# Patient Record
Sex: Male | Born: 1950 | Race: White | Hispanic: No | Smoking: Former smoker
Health system: Southern US, Community
[De-identification: ages and names within clinical notes are randomized; demographics above are authoritative.]

## PROBLEM LIST (undated history)

## (undated) DIAGNOSIS — G709 Myoneural disorder, unspecified: Secondary | ICD-10-CM

## (undated) DIAGNOSIS — I4891 Unspecified atrial fibrillation: Secondary | ICD-10-CM

## (undated) DIAGNOSIS — Z9889 Other specified postprocedural states: Secondary | ICD-10-CM

## (undated) DIAGNOSIS — I10 Essential (primary) hypertension: Secondary | ICD-10-CM

## (undated) DIAGNOSIS — E785 Hyperlipidemia, unspecified: Secondary | ICD-10-CM

## (undated) DIAGNOSIS — Z8679 Personal history of other diseases of the circulatory system: Secondary | ICD-10-CM

## (undated) DIAGNOSIS — R011 Cardiac murmur, unspecified: Secondary | ICD-10-CM

## (undated) DIAGNOSIS — C801 Malignant (primary) neoplasm, unspecified: Secondary | ICD-10-CM

## (undated) DIAGNOSIS — K219 Gastro-esophageal reflux disease without esophagitis: Secondary | ICD-10-CM

## (undated) DIAGNOSIS — F32A Depression, unspecified: Secondary | ICD-10-CM

## (undated) DIAGNOSIS — I499 Cardiac arrhythmia, unspecified: Secondary | ICD-10-CM

## (undated) DIAGNOSIS — F329 Major depressive disorder, single episode, unspecified: Secondary | ICD-10-CM

## (undated) HISTORY — DX: Essential (primary) hypertension: I10

## (undated) HISTORY — DX: Hyperlipidemia, unspecified: E78.5

## (undated) HISTORY — DX: Malignant (primary) neoplasm, unspecified: C80.1

## (undated) HISTORY — PX: CARDIAC CATHETERIZATION: SHX172

## (undated) HISTORY — PX: TRANSTHORACIC ECHOCARDIOGRAM: SHX275

## (undated) HISTORY — PX: TONSILLECTOMY: SUR1361

## (undated) HISTORY — PX: COLONOSCOPY: SHX174

## (undated) HISTORY — PX: MITRAL VALVE REPLACEMENT: SHX147

## (undated) HISTORY — PX: OTHER SURGICAL HISTORY: SHX169

---

## 2002-11-09 ENCOUNTER — Encounter: Payer: Self-pay | Admitting: Internal Medicine

## 2004-06-08 ENCOUNTER — Ambulatory Visit: Payer: Self-pay | Admitting: Internal Medicine

## 2004-06-20 ENCOUNTER — Ambulatory Visit: Payer: Self-pay | Admitting: Internal Medicine

## 2004-07-16 ENCOUNTER — Ambulatory Visit: Payer: Self-pay | Admitting: Internal Medicine

## 2004-10-01 ENCOUNTER — Ambulatory Visit: Payer: Self-pay | Admitting: Internal Medicine

## 2004-10-05 ENCOUNTER — Ambulatory Visit: Payer: Self-pay | Admitting: Internal Medicine

## 2005-03-15 ENCOUNTER — Ambulatory Visit: Payer: Self-pay | Admitting: Internal Medicine

## 2005-05-27 ENCOUNTER — Ambulatory Visit: Payer: Self-pay | Admitting: Internal Medicine

## 2005-06-03 ENCOUNTER — Ambulatory Visit: Payer: Self-pay | Admitting: Internal Medicine

## 2005-11-18 ENCOUNTER — Ambulatory Visit: Payer: Self-pay | Admitting: General Surgery

## 2005-11-18 ENCOUNTER — Encounter: Payer: Self-pay | Admitting: Internal Medicine

## 2005-11-20 ENCOUNTER — Ambulatory Visit: Payer: Self-pay | Admitting: Internal Medicine

## 2006-03-11 ENCOUNTER — Ambulatory Visit: Payer: Self-pay | Admitting: Internal Medicine

## 2006-04-18 ENCOUNTER — Ambulatory Visit: Payer: Self-pay | Admitting: Internal Medicine

## 2006-06-06 ENCOUNTER — Ambulatory Visit: Payer: Self-pay | Admitting: Internal Medicine

## 2006-06-06 LAB — CONVERTED CEMR LAB
ALT: 34 units/L (ref 0–40)
AST: 26 units/L (ref 0–37)
Albumin: 4.2 g/dL (ref 3.5–5.2)
Alkaline Phosphatase: 58 units/L (ref 39–117)
BUN: 14 mg/dL (ref 6–23)
Basophils Absolute: 0 10*3/uL (ref 0.0–0.1)
Basophils Relative: 0.2 % (ref 0.0–1.0)
Bilirubin, Direct: 0.1 mg/dL (ref 0.0–0.3)
CO2: 32 meq/L (ref 19–32)
Calcium: 9.5 mg/dL (ref 8.4–10.5)
Chloride: 103 meq/L (ref 96–112)
Cholesterol: 218 mg/dL (ref 0–200)
Creatinine, Ser: 0.9 mg/dL (ref 0.4–1.5)
Direct LDL: 166 mg/dL
Eosinophils Absolute: 0.3 10*3/uL (ref 0.0–0.6)
Eosinophils Relative: 3.9 % (ref 0.0–5.0)
GFR calc Af Amer: 112 mL/min
GFR calc non Af Amer: 93 mL/min
Glucose, Bld: 83 mg/dL (ref 70–99)
HCT: 48.5 % (ref 39.0–52.0)
HDL: 47.1 mg/dL (ref >39.0)
Hemoglobin: 16.5 g/dL (ref 13.0–17.0)
Lymphocytes Relative: 33.7 % (ref 12.0–46.0)
MCHC: 34.1 g/dL (ref 30.0–36.0)
MCV: 89.8 fL (ref 78.0–100.0)
Monocytes Absolute: 0.6 10*3/uL (ref 0.2–0.7)
Monocytes Relative: 9.7 % (ref 3.0–11.0)
Neutro Abs: 3.4 10*3/uL (ref 1.4–7.7)
Neutrophils Relative %: 52.5 % (ref 43.0–77.0)
PSA: 1.74 ng/mL (ref 0.10–4.00)
Platelets: 223 10*3/uL (ref 150–400)
Potassium: 4.1 meq/L (ref 3.5–5.1)
RBC: 5.4 M/uL (ref 4.22–5.81)
RDW: 12 % (ref 11.5–14.6)
Sodium: 143 meq/L (ref 135–145)
TSH: 1.48 microintl units/mL (ref 0.35–5.50)
Total Bilirubin: 0.9 mg/dL (ref 0.3–1.2)
Total CHOL/HDL Ratio: 4.6
Total Protein: 6.8 g/dL (ref 6.0–8.3)
Triglycerides: 162 mg/dL — ABNORMAL HIGH (ref 0–149)
VLDL: 32 mg/dL (ref 0–40)
WBC: 6.5 10*3/uL (ref 4.5–10.5)

## 2006-06-11 ENCOUNTER — Ambulatory Visit: Payer: Self-pay | Admitting: Internal Medicine

## 2006-09-29 ENCOUNTER — Ambulatory Visit: Payer: Self-pay | Admitting: Internal Medicine

## 2006-09-29 LAB — CONVERTED CEMR LAB
Cholesterol: 183 mg/dL (ref 0–200)
HDL: 34.8 mg/dL — ABNORMAL LOW (ref >39.0)
LDL Cholesterol: 116 mg/dL — ABNORMAL HIGH (ref 0–99)
Total CHOL/HDL Ratio: 5.3
Triglycerides: 160 mg/dL — ABNORMAL HIGH (ref 0–149)
VLDL: 32 mg/dL (ref 0–40)

## 2006-10-17 ENCOUNTER — Ambulatory Visit: Payer: Self-pay | Admitting: Internal Medicine

## 2007-06-08 ENCOUNTER — Encounter: Payer: Self-pay | Admitting: Internal Medicine

## 2007-06-12 ENCOUNTER — Ambulatory Visit: Payer: Self-pay | Admitting: Internal Medicine

## 2007-06-12 LAB — CONVERTED CEMR LAB
ALT: 27 units/L (ref 0–53)
AST: 27 units/L (ref 0–37)
Albumin: 3.8 g/dL (ref 3.5–5.2)
Alkaline Phosphatase: 62 units/L (ref 39–117)
BUN: 14 mg/dL (ref 6–23)
Basophils Absolute: 0 10*3/uL (ref 0.0–0.1)
Basophils Relative: 0.5 % (ref 0.0–1.0)
Bilirubin Urine: NEGATIVE
Bilirubin, Direct: 0.2 mg/dL (ref 0.0–0.3)
Blood in Urine, dipstick: NEGATIVE
CO2: 33 meq/L — ABNORMAL HIGH (ref 19–32)
Calcium: 9.3 mg/dL (ref 8.4–10.5)
Chloride: 107 meq/L (ref 96–112)
Cholesterol: 204 mg/dL (ref 0–200)
Creatinine, Ser: 0.9 mg/dL (ref 0.4–1.5)
Direct LDL: 149 mg/dL
Eosinophils Absolute: 0.3 10*3/uL (ref 0.0–0.6)
Eosinophils Relative: 4.3 % (ref 0.0–5.0)
GFR calc Af Amer: 112 mL/min
GFR calc non Af Amer: 92 mL/min
Glucose, Bld: 82 mg/dL (ref 70–99)
Glucose, Urine, Semiquant: NEGATIVE
HCT: 47.4 % (ref 39.0–52.0)
HDL: 39.5 mg/dL (ref >39.0)
Hemoglobin: 15.6 g/dL (ref 13.0–17.0)
Ketones, urine, test strip: NEGATIVE
Lymphocytes Relative: 34.2 % (ref 12.0–46.0)
MCHC: 33 g/dL (ref 30.0–36.0)
MCV: 91.4 fL (ref 78.0–100.0)
Monocytes Absolute: 0.7 10*3/uL (ref 0.2–0.7)
Monocytes Relative: 10.6 % (ref 3.0–11.0)
Neutro Abs: 3.1 10*3/uL (ref 1.4–7.7)
Neutrophils Relative %: 50.4 % (ref 43.0–77.0)
Nitrite: NEGATIVE
PSA: 1.81 ng/mL (ref 0.10–4.00)
Platelets: 190 10*3/uL (ref 150–400)
Potassium: 4.5 meq/L (ref 3.5–5.1)
Protein, U semiquant: NEGATIVE
RBC: 5.19 M/uL (ref 4.22–5.81)
RDW: 12.1 % (ref 11.5–14.6)
Sodium: 145 meq/L (ref 135–145)
Specific Gravity, Urine: 1.025
TSH: 0.96 microintl units/mL (ref 0.35–5.50)
Total Bilirubin: 0.8 mg/dL (ref 0.3–1.2)
Total CHOL/HDL Ratio: 5.2
Total Protein: 6 g/dL (ref 6.0–8.3)
Triglycerides: 93 mg/dL (ref 0–149)
Urobilinogen, UA: 0.2
VLDL: 19 mg/dL (ref 0–40)
WBC Urine, dipstick: NEGATIVE
WBC: 6.3 10*3/uL (ref 4.5–10.5)
pH: 6

## 2007-06-18 ENCOUNTER — Ambulatory Visit: Payer: Self-pay | Admitting: Internal Medicine

## 2007-06-18 DIAGNOSIS — I10 Essential (primary) hypertension: Secondary | ICD-10-CM | POA: Insufficient documentation

## 2007-06-18 DIAGNOSIS — E785 Hyperlipidemia, unspecified: Secondary | ICD-10-CM | POA: Insufficient documentation

## 2007-06-18 DIAGNOSIS — Z9889 Other specified postprocedural states: Secondary | ICD-10-CM | POA: Insufficient documentation

## 2007-06-18 DIAGNOSIS — I059 Rheumatic mitral valve disease, unspecified: Secondary | ICD-10-CM | POA: Insufficient documentation

## 2007-06-18 DIAGNOSIS — I38 Endocarditis, valve unspecified: Secondary | ICD-10-CM | POA: Insufficient documentation

## 2007-06-18 LAB — CONVERTED CEMR LAB
Cholesterol, target level: 200 mg/dL
HDL goal, serum: 40 mg/dL
LDL Goal: 130 mg/dL

## 2007-08-05 ENCOUNTER — Encounter: Payer: Self-pay | Admitting: Internal Medicine

## 2007-10-09 ENCOUNTER — Ambulatory Visit: Payer: Self-pay | Admitting: Internal Medicine

## 2007-10-09 DIAGNOSIS — T887XXA Unspecified adverse effect of drug or medicament, initial encounter: Secondary | ICD-10-CM | POA: Insufficient documentation

## 2007-10-09 LAB — CONVERTED CEMR LAB
ALT: 24 units/L (ref 0–53)
AST: 24 units/L (ref 0–37)
Albumin: 3.8 g/dL (ref 3.5–5.2)
Alkaline Phosphatase: 58 units/L (ref 39–117)
Bilirubin, Direct: 0.1 mg/dL (ref 0.0–0.3)
Cholesterol: 175 mg/dL (ref 0–200)
HDL: 31.5 mg/dL — ABNORMAL LOW (ref >39.0)
LDL Cholesterol: 124 mg/dL — ABNORMAL HIGH (ref 0–99)
Total Bilirubin: 0.6 mg/dL (ref 0.3–1.2)
Total CHOL/HDL Ratio: 5.6
Total Protein: 6 g/dL (ref 6.0–8.3)
Triglycerides: 96 mg/dL (ref 0–149)
VLDL: 19 mg/dL (ref 0–40)

## 2007-10-21 ENCOUNTER — Ambulatory Visit: Payer: Self-pay | Admitting: Internal Medicine

## 2008-01-22 ENCOUNTER — Telehealth: Payer: Self-pay | Admitting: Internal Medicine

## 2008-06-17 ENCOUNTER — Encounter: Payer: Self-pay | Admitting: Internal Medicine

## 2008-07-06 ENCOUNTER — Telehealth: Payer: Self-pay | Admitting: *Deleted

## 2008-07-08 ENCOUNTER — Ambulatory Visit: Payer: Self-pay | Admitting: Internal Medicine

## 2008-07-08 LAB — CONVERTED CEMR LAB
ALT: 27 units/L (ref 0–53)
AST: 22 units/L (ref 0–37)
Albumin: 4 g/dL (ref 3.5–5.2)
Alkaline Phosphatase: 59 units/L (ref 39–117)
BUN: 13 mg/dL (ref 6–23)
Basophils Absolute: 0 10*3/uL (ref 0.0–0.1)
Basophils Relative: 0.8 % (ref 0.0–3.0)
Bilirubin Urine: NEGATIVE
Bilirubin, Direct: 0 mg/dL (ref 0.0–0.3)
CO2: 26 meq/L (ref 19–32)
Calcium: 8.8 mg/dL (ref 8.4–10.5)
Chloride: 108 meq/L (ref 96–112)
Cholesterol: 186 mg/dL (ref 0–200)
Creatinine, Ser: 0.8 mg/dL (ref 0.4–1.5)
Eosinophils Absolute: 0.3 10*3/uL (ref 0.0–0.7)
Eosinophils Relative: 5.4 % — ABNORMAL HIGH (ref 0.0–5.0)
GFR calc non Af Amer: 105.49 mL/min (ref >60)
Glucose, Bld: 95 mg/dL (ref 70–99)
HCT: 43.5 % (ref 39.0–52.0)
HDL: 38.5 mg/dL — ABNORMAL LOW (ref >39.00)
Hemoglobin, Urine: NEGATIVE
Hemoglobin: 15.2 g/dL (ref 13.0–17.0)
Ketones, ur: NEGATIVE mg/dL
LDL Cholesterol: 127 mg/dL — ABNORMAL HIGH (ref 0–99)
Leukocytes, UA: NEGATIVE
Lymphocytes Relative: 35.2 % (ref 12.0–46.0)
Lymphs Abs: 2.1 10*3/uL (ref 0.7–4.0)
MCHC: 34.9 g/dL (ref 30.0–36.0)
MCV: 88.7 fL (ref 78.0–100.0)
Monocytes Absolute: 0.6 10*3/uL (ref 0.1–1.0)
Monocytes Relative: 9.2 % (ref 3.0–12.0)
Neutro Abs: 3.1 10*3/uL (ref 1.4–7.7)
Neutrophils Relative %: 49.4 % (ref 43.0–77.0)
Nitrite: NEGATIVE
PSA: 1.35 ng/mL (ref 0.10–4.00)
Platelets: 190 10*3/uL (ref 150.0–400.0)
Potassium: 4 meq/L (ref 3.5–5.1)
RBC: 4.9 M/uL (ref 4.22–5.81)
RDW: 12 % (ref 11.5–14.6)
Sodium: 141 meq/L (ref 135–145)
Specific Gravity, Urine: 1.02 (ref 1.000–1.030)
TSH: 1.67 microintl units/mL (ref 0.35–5.50)
Total Bilirubin: 0.6 mg/dL (ref 0.3–1.2)
Total CHOL/HDL Ratio: 5
Total Protein, Urine: NEGATIVE mg/dL
Total Protein: 6.5 g/dL (ref 6.0–8.3)
Triglycerides: 104 mg/dL (ref 0.0–149.0)
Urine Glucose: NEGATIVE mg/dL
Urobilinogen, UA: 0.2 (ref 0.0–1.0)
VLDL: 20.8 mg/dL (ref 0.0–40.0)
WBC: 6.1 10*3/uL (ref 4.5–10.5)
pH: 6 (ref 5.0–8.0)

## 2008-07-15 ENCOUNTER — Ambulatory Visit: Payer: Self-pay | Admitting: Internal Medicine

## 2009-04-22 HISTORY — PX: CHOLECYSTECTOMY: SHX55

## 2009-04-22 HISTORY — PX: HERNIA REPAIR: SHX51

## 2009-05-03 ENCOUNTER — Encounter: Payer: Self-pay | Admitting: Internal Medicine

## 2009-10-09 ENCOUNTER — Ambulatory Visit: Payer: Self-pay | Admitting: Internal Medicine

## 2009-10-09 LAB — CONVERTED CEMR LAB
ALT: 24 units/L (ref 0–53)
AST: 24 units/L (ref 0–37)
Albumin: 4.1 g/dL (ref 3.5–5.2)
Alkaline Phosphatase: 58 units/L (ref 39–117)
BUN: 16 mg/dL (ref 6–23)
Basophils Absolute: 0.1 10*3/uL (ref 0.0–0.1)
Basophils Relative: 0.9 % (ref 0.0–3.0)
Bilirubin Urine: NEGATIVE
Bilirubin, Direct: 0.1 mg/dL (ref 0.0–0.3)
Blood in Urine, dipstick: NEGATIVE
CO2: 30 meq/L (ref 19–32)
Calcium: 9 mg/dL (ref 8.4–10.5)
Chloride: 106 meq/L (ref 96–112)
Cholesterol: 218 mg/dL — ABNORMAL HIGH (ref 0–200)
Creatinine, Ser: 0.9 mg/dL (ref 0.4–1.5)
Direct LDL: 159.5 mg/dL
Eosinophils Absolute: 0.3 10*3/uL (ref 0.0–0.7)
Eosinophils Relative: 4.8 % (ref 0.0–5.0)
GFR calc non Af Amer: 89.39 mL/min (ref >60)
Glucose, Bld: 86 mg/dL (ref 70–99)
Glucose, Urine, Semiquant: NEGATIVE
HCT: 45.2 % (ref 39.0–52.0)
HDL: 47.8 mg/dL (ref >39.00)
Hemoglobin: 15.5 g/dL (ref 13.0–17.0)
Ketones, urine, test strip: NEGATIVE
Lymphocytes Relative: 30.5 % (ref 12.0–46.0)
Lymphs Abs: 2.1 10*3/uL (ref 0.7–4.0)
MCHC: 34.2 g/dL (ref 30.0–36.0)
MCV: 91.4 fL (ref 78.0–100.0)
Monocytes Absolute: 0.7 10*3/uL (ref 0.1–1.0)
Monocytes Relative: 9.4 % (ref 3.0–12.0)
Neutro Abs: 3.8 10*3/uL (ref 1.4–7.7)
Neutrophils Relative %: 54.4 % (ref 43.0–77.0)
Nitrite: NEGATIVE
PSA: 1.8 ng/mL (ref 0.10–4.00)
Platelets: 172 10*3/uL (ref 150.0–400.0)
Potassium: 4 meq/L (ref 3.5–5.1)
Protein, U semiquant: NEGATIVE
RBC: 4.94 M/uL (ref 4.22–5.81)
RDW: 13.9 % (ref 11.5–14.6)
Sodium: 143 meq/L (ref 135–145)
Specific Gravity, Urine: 1.01
TSH: 2.07 microintl units/mL (ref 0.35–5.50)
Total Bilirubin: 0.5 mg/dL (ref 0.3–1.2)
Total CHOL/HDL Ratio: 5
Total Protein: 6.6 g/dL (ref 6.0–8.3)
Triglycerides: 169 mg/dL — ABNORMAL HIGH (ref 0.0–149.0)
Urobilinogen, UA: 0.2
VLDL: 33.8 mg/dL (ref 0.0–40.0)
WBC Urine, dipstick: NEGATIVE
WBC: 7 10*3/uL (ref 4.5–10.5)
pH: 5.5

## 2009-10-17 ENCOUNTER — Ambulatory Visit: Payer: Self-pay | Admitting: Internal Medicine

## 2009-11-06 ENCOUNTER — Ambulatory Visit: Payer: Self-pay | Admitting: General Surgery

## 2009-11-16 ENCOUNTER — Ambulatory Visit: Payer: Self-pay | Admitting: General Surgery

## 2009-11-20 ENCOUNTER — Ambulatory Visit: Payer: Self-pay | Admitting: General Surgery

## 2009-11-21 LAB — PATHOLOGY REPORT

## 2010-01-16 ENCOUNTER — Ambulatory Visit: Payer: Self-pay | Admitting: Internal Medicine

## 2010-01-16 LAB — CONVERTED CEMR LAB
ALT: 29 units/L (ref 0–53)
AST: 23 units/L (ref 0–37)
Albumin: 4.4 g/dL (ref 3.5–5.2)
Alkaline Phosphatase: 65 units/L (ref 39–117)
Bilirubin, Direct: 0.2 mg/dL (ref 0.0–0.3)
Cholesterol: 204 mg/dL — ABNORMAL HIGH (ref 0–200)
Direct LDL: 135.5 mg/dL
HDL: 38.4 mg/dL — ABNORMAL LOW (ref >39.00)
Total Bilirubin: 0.6 mg/dL (ref 0.3–1.2)
Total CHOL/HDL Ratio: 5
Total Protein: 7.1 g/dL (ref 6.0–8.3)
Triglycerides: 143 mg/dL (ref 0.0–149.0)
VLDL: 28.6 mg/dL (ref 0.0–40.0)

## 2010-02-05 ENCOUNTER — Ambulatory Visit: Payer: Self-pay | Admitting: Internal Medicine

## 2010-02-05 DIAGNOSIS — Z8719 Personal history of other diseases of the digestive system: Secondary | ICD-10-CM | POA: Insufficient documentation

## 2010-05-23 NOTE — Procedures (Signed)
Summary: colonoscopy  colonoscopy   Imported By: Kassie Mends 07/06/2007 09:59:55  _____________________________________________________________________  External Attachment:    Type:   Image     Comment:   colonoscopy

## 2010-05-23 NOTE — Assessment & Plan Note (Signed)
Summary: 4 month rov/njr   Vital Signs:  Patient Profile:   60 Years Old Male Height:     66 inches Weight:      154 pounds BMI:     24.95 Temp:     98.2 degrees F oral Pulse rate:   76 / minute Resp:     14 per minute BP sitting:   130 / 78  (left arm)  Vitals Entered By: Willy Eddy, LPN (October 20, 1608 11:18 AM)                 Chief Complaint:  roa labs.  History of Present Illness:  Hyperlipidemia Follow-Up      This is a 60 year old man who presents for Hyperlipidemia follow-up.  The patient denies muscle aches, GI upset, abdominal pain, flushing, itching, constipation, diarrhea, and fatigue.  The patient denies the following symptoms: chest pain/pressure, exercise intolerance, dypsnea, palpitations, syncope, and pedal edema.  Compliance with medications (by patient report) has been near 100%.  Dietary compliance has been excellent.  The patient reports exercising daily.  Adjunctive measures currently used by the patient include fiber and weight reduction.      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       Chorda Rupture       Valvuloplasty with Placement of Cosgrove ring  Past Surgical History:    Reviewed history from 06/18/2007 and no changes required:       valvuloplasty with replacement of cosgrove ring , l congenital rubture of cordai       Appendectomy   Family History:    Reviewed history from 06/18/2007 and no changes required:       Family History of Colon CA 1st degree relative <60       Family History Other cancer,  father ssca of mouth  Social History:    Reviewed history from 06/18/2007 and no changes required:       Married       Never Smoked       Alcohol use-yes       Drug use-no       Regular exercise-yes    Review of Systems  The patient denies anorexia, fever, weight loss, weight  gain, vision loss, decreased hearing, hoarseness, chest pain, syncope, dyspnea on exertion, peripheral edema, prolonged cough, headaches, hemoptysis, abdominal pain, melena, hematochezia, severe indigestion/heartburn, hematuria, incontinence, genital sores, muscle weakness, suspicious skin lesions, transient blindness, difficulty walking, depression, unusual weight change, abnormal bleeding, enlarged lymph nodes, angioedema, and breast masses.     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   Eyes:     pupils equal and pupils round.   Ears:     R ear normal and L ear normal.   Nose:     no nasal discharge.   Mouth:     pharynx pink and moist.   Neck:     No deformities, masses, or tenderness noted. Lungs:     Normal respiratory effort, chest expands symmetrically. Lungs are clear to auscultation, no crackles or wheezes. Heart:     Normal rate and regular rhythm. S1 and S2 normal without gallop, murmur,  click, rub or other extra sounds. Abdomen:     Bowel sounds positive,abdomen soft and non-tender without masses, organomegaly or hernias noted.    Impression & Recommendations:  Problem # 1:  HYPERTENSION (ICD-401.9)  His updated medication list for this problem includes:    Coreg 6.25 Mg Tabs (Carvedilol) .Marland Kitchen..Marland Kitchen Two times a day  BP today: 130/78 Prior BP: 124/76 (06/18/2007)  Prior 10 Yr Risk Heart Disease: Not enough information (06/18/2007)  Labs Reviewed: Creat: 0.9 (06/12/2007) Chol: 175 (10/09/2007)   HDL: 31.5 (10/09/2007)   LDL: 124 (10/09/2007)   TG: 96 (10/09/2007)   Problem # 2:  HYPERLIPIDEMIA (ICD-272.4)  Labs Reviewed: Chol: 175 (10/09/2007)   HDL: 31.5 (10/09/2007)   LDL: 124 (10/09/2007)   TG: 96 (10/09/2007) SGOT: 24 (10/09/2007)   SGPT: 24 (10/09/2007)  Lipid Goals: Chol Goal: 200 (06/18/2007)   HDL Goal: 40  (06/18/2007)   LDL Goal: 130 (06/18/2007)   TG Goal: 150 (06/18/2007)  Prior 10 Yr Risk Heart Disease: Not enough information (06/18/2007)   Problem # 3:  MITRAL VALVE PROLAPSE (ICD-424.0)  His updated medication list for this problem includes:    Coreg 6.25 Mg Tabs (Carvedilol) .Marland Kitchen..Marland Kitchen Two times a day    Adult Aspirin Low Strength 81 Mg Tbdp (Aspirin) ..... Once daily   Complete Medication List: 1)  Proctosert Hc 30 Mg Supp (Hydrocortisone acetate) .... Use as directed 2)  Nexium 40 Mg Cpdr (Esomeprazole magnesium) .... Once daily 3)  Coreg 6.25 Mg Tabs (Carvedilol) .... Two times a day 4)  Nexium 40 Mg Cpdr (Esomeprazole magnesium) .... Once daily 5)  Remeron 15 Mg Tabs (Mirtazapine) .... Once daily 6)  Adult Aspirin Low Strength 81 Mg Tbdp (Aspirin) .... Once daily 7)  Fish Oil Concentrate 1000 Mg Caps (Omega-3 fatty acids) .... One by mouth two times a day with food 8)  Loprox 1 % Sham (Ciclopirox) .... Apply to scalp as directed 9)  Clindagel 1 % Gel (Clindamycin phosphate) .... Apply to face daily   Patient Instructions: 1)  Please schedule a follow-up appointment in 7 months.   CPX  with labs prior   ]

## 2010-05-23 NOTE — Procedures (Signed)
Summary: colonoscopy  colonoscopy   Imported By: Kassie Mends 06/19/2007 10:40:44  _____________________________________________________________________  External Attachment:    Type:   Image     Comment:   colonoscopy

## 2010-05-23 NOTE — Assessment & Plan Note (Signed)
Summary: cpx/ccm  Medications Added COREG 6.25 MG  TABS (CARVEDILOL) two times a day NEXIUM 40 MG  CPDR (ESOMEPRAZOLE MAGNESIUM) once daily REMERON 15 MG  TABS (MIRTAZAPINE) once daily ADULT ASPIRIN LOW STRENGTH 81 MG  TBDP (ASPIRIN) once daily FISH OIL CONCENTRATE 1000 MG  CAPS (OMEGA-3 FATTY ACIDS) one by mouth two times a day with food LOPROX 1 %  SHAM (CICLOPIROX) apply to scalp as directed CLINDAGEL 1 %  GEL (CLINDAMYCIN PHOSPHATE) apply to face daily        Vital Signs:  Patient Profile:   60 Years Old Male Height:     66 inches Weight:      151 pounds Temp:     98.6 degrees F oral Pulse rate:   72 / minute Resp:     12 per minute BP sitting:   124 / 76  (left arm)  Vitals Entered By: Willy Eddy, LPN (June 18, 2007 2:44 PM)                 Chief Complaint:  cpx/dt in 2003/colon in 2007/saw card last week with ekg.  Lipid Management History:      Positive NCEP/ATP III risk factors include male age 72 years old or older, HDL cholesterol less than 40, and hypertension.  Negative NCEP/ATP III risk factors include non-tobacco-user status, no ASHD (atherosclerotic heart disease), no prior stroke/TIA, no peripheral vascular disease, and no history of aortic aneurysm.       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       Chorda Rupture       Valvuloplasty with Placement of Cosgrove ring  Past Surgical History:    valvuloplasty with replacement of cosgrove ring , l congenital rubture of cordai    Appendectomy   Family History:    Reviewed history and no changes required:       Family History of Colon CA 1st degree relative <60       Family History Other cancer,  father ssca of mouth  Social History:    Reviewed history and no changes required:       Married       Never Smoked       Alcohol use-yes       Drug use-no   Regular exercise-yes   Risk Factors:  Tobacco use:  never Drug use:  no Alcohol use:  yes Exercise:  yes  Colonoscopy History:     Date of Last Colonoscopy:  11/18/2005    Results:  Results: Normal.    Colonoscopy History:     Date of Last Colonoscopy:  11/18/2005    Results:  Normal   Colonoscopy History:     Date of Last Colonoscopy:  04/22/2005    Results:  due in 5 years    Review of Systems  The patient denies anorexia, fever, weight loss, weight gain, vision loss, decreased hearing, hoarseness, chest pain, syncope, dyspnea on exhertion, peripheral edema, prolonged cough, hemoptysis, abdominal pain, melena, hematochezia, severe indigestion/heartburn, hematuria, incontinence, genital sores, muscle weakness, suspicious skin lesions, transient blindness, difficulty walking, depression, unusual weight change, abnormal bleeding, enlarged lymph nodes, angioedema, breast masses, and testicular masses.     Physical Exam  General:     Well-developed,well-nourished,in no acute distress; alert,appropriate and cooperative throughout examination Head:  focal alopecia and male-pattern balding.   Eyes:     pupils equal and pupils round.   Ears:     R ear normal and L ear normal.   Nose:     no nasal discharge.   Mouth:     pharynx pink and moist.   Neck:     No deformities, masses, or tenderness noted. Chest Wall:     No deformities, masses, tenderness or gynecomastia noted. Breasts:     No masses or gynecomastia noted Lungs:     Normal respiratory effort, chest expands symmetrically. Lungs are clear to auscultation, no crackles or wheezes. Heart:     Normal rate and regular rhythm. S1 and S2 normal without gallop, murmur, click, rub or other extra sounds.    Impression & Recommendations:  Problem # 1:  HYPERLIPIDEMIA (ICD-272.4)  Labs Reviewed: Chol: 204 (06/12/2007)   HDL: 39.5 (06/12/2007)   LDL: DEL (06/12/2007)   TG: 93 (06/12/2007) SGOT: 27 (06/12/2007)    SGPT: 27 (06/12/2007)  Lipid Goals: Chol Goal: 200 (06/18/2007)   HDL Goal: 40 (06/18/2007)   LDL Goal: 130 (06/18/2007)   TG Goal: 150 (06/18/2007)  10 Yr Risk Heart Disease: Not enough information   Problem # 2:  VALVULAR HEART DISEASE (ICD-424.90)  His updated medication list for this problem includes:    Coreg 6.25 Mg Tabs (Carvedilol) .Marland Kitchen..Marland Kitchen Two times a day    Adult Aspirin Low Strength 81 Mg Tbdp (Aspirin) ..... Once daily   Problem # 3:  HYPERTENSION (ICD-401.9)  His updated medication list for this problem includes:    Coreg 6.25 Mg Tabs (Carvedilol) .Marland Kitchen..Marland Kitchen Two times a day  BP today: 124/76  10 Yr Risk Heart Disease: Not enough information  Labs Reviewed: Creat: 0.9 (06/12/2007) Chol: 204 (06/12/2007)   HDL: 39.5 (06/12/2007)   LDL: DEL (06/12/2007)   TG: 93 (06/12/2007)   Problem # 4:  Preventive Health Care (ICD-V70.0)  Reviewed preventive care protocols, scheduled due services, and updated immunizations.   Medications Added to Medication List This Visit: 1)  Coreg 6.25 Mg Tabs (Carvedilol) .... Two times a day 2)  Nexium 40 Mg Cpdr (Esomeprazole magnesium) .... Once daily 3)  Remeron 15 Mg Tabs (Mirtazapine) .... Once daily 4)  Adult Aspirin Low Strength 81 Mg Tbdp (Aspirin) .... Once daily 5)  Fish Oil Concentrate 1000 Mg Caps (Omega-3 fatty acids) .... One by mouth two times a day with food 6)  Loprox 1 % Sham (Ciclopirox) .... Apply to scalp as directed 7)  Clindagel 1 % Gel (Clindamycin phosphate) .... Apply to face daily  Complete Medication List: 1)  Proctosert Hc 30 Mg Supp (Hydrocortisone acetate) .... Use as directed 2)  Nexium 40 Mg Cpdr (Esomeprazole magnesium) .... Once daily 3)  Coreg 6.25 Mg Tabs (Carvedilol) .... Two times a day 4)  Nexium 40 Mg Cpdr (Esomeprazole magnesium) .... Once daily 5)  Remeron 15 Mg Tabs (Mirtazapine) .... Once daily 6)  Adult Aspirin Low Strength 81 Mg Tbdp (Aspirin) .... Once daily 7)  Fish Oil Concentrate 1000 Mg  Caps (Omega-3 fatty acids) .... One by mouth two times a day with food 8)  Loprox 1 % Sham (Ciclopirox) .... Apply to scalp as directed 9)  Clindagel 1 % Gel (Clindamycin phosphate) .... Apply to face daily  Lipid Assessment/Plan:      Based on NCEP/ATP III, the patient's risk factor category is "2 or more risk factors and a calculated 10 year CAD risk of < 20%".  From this information, the patient's calculated lipid goals are as follows: Total cholesterol goal is 200; LDL cholesterol goal is 130; HDL cholesterol goal is 40; Triglyceride goal is 150.  His LDL cholesterol goal has been met.     Patient Instructions: 1)  Please schedule a follow-up appointment in 4 months. 2)  Hepatic Panel prior to visit, ICD-9:995.20 3)  Lipid Panel prior to visit, ICD-9:272.4    Prescriptions: CLINDAGEL 1 %  GEL (CLINDAMYCIN PHOSPHATE) apply to face daily  #60gm x 11   Entered and Authorized by:   Stacie Glaze MD   Signed by:   Stacie Glaze MD on 06/18/2007   Method used:   Print then Give to Patient   RxID:   [id number] LOPROX 1 %  SHAM (CICLOPIROX) apply to scalp as directed  #1 bottle x 11   Entered and Authorized by:   Stacie Glaze MD   Signed by:   Stacie Glaze MD on 06/18/2007   Method used:   Print then Give to Patient   RxID:   [id number]  ]  Colonoscopy  Procedure date:  04/22/2005  Findings:      due in 5 years  Colonoscopy  Procedure date:  11/18/2005  Findings:      Results: Normal.   Comments:      Repeat colonoscopy in 3 years.     Procedures Next Due Date:    Colonoscopy: 11/2008   Colonoscopy  Procedure date:  04/22/2005  Findings:      due in 5 years  Colonoscopy  Procedure date:  11/18/2005  Findings:      Results: Normal.   Comments:      Repeat colonoscopy in 3 years.     Procedures Next Due Date:    Colonoscopy: 11/2008    Tetanus/Td Immunization History:    Tetanus/Td # 1:  Td (05/23/2001)

## 2010-05-23 NOTE — Assessment & Plan Note (Signed)
Summary: cpx/njr rsc bmp/njr/resch from bump list/jls   Vital Signs:  Patient profile:   60 year old male Height:      66 inches Weight:      158 pounds BMI:     25.59 Temp:     98.4 degrees F oral Pulse rate:   76 / minute Resp:     14 per minute BP sitting:   116 / 74  (left arm)  Vitals Entered By: Willy Eddy, LPN (July 15, 2008 3:08 PM)  CC:  cpx----sees dr foth cardioloigst in [place].  Problems Prior to Update: 1)  Uns Advrs Eff Uns Rx Medicinal&biological Sbstnc  (ICD-995.20) 2)  Preventive Health Care  (ICD-V70.0) 3)  Family History of Colon Ca 1st Degree Relative <60  (ICD-V16.0) 4)  Mitral Valve Prolapse  (ICD-424.0) 5)  Valvular Heart Disease  (ICD-424.90) 6)  Hypertension  (ICD-401.9) 7)  Hyperlipidemia  (ICD-272.4)  Medications Prior to Update: 1)  Proctosert Hc 30 Mg  Supp (Hydrocortisone Acetate) .... Use As Directed 2)  Nexium 40 Mg  Cpdr (Esomeprazole Magnesium) .... Once Daily 3)  Coreg 6.25 Mg  Tabs (Carvedilol) .... Two Times A Day 4)  Nexium 40 Mg  Cpdr (Esomeprazole Magnesium) .... Once Daily 5)  Remeron 15 Mg  Tabs (Mirtazapine) .... Once Daily 6)  Adult Aspirin Low Strength 81 Mg  Tbdp (Aspirin) .... Once Daily 7)  Fish Oil Concentrate 1000 Mg  Caps (Omega-3 Fatty Acids) .... One By Mouth Two Times A Day With Food 8)  Loprox 1 %  Sham (Ciclopirox) .... Apply To Scalp As Directed 9)  Clindagel 1 %  Gel (Clindamycin Phosphate) .... Apply To Face Daily  Current Medications (verified): 1)  Proctosert Hc 30 Mg  Supp (Hydrocortisone Acetate) .... Use As Directed 2)  Coreg Cr 20 Mg Xr24h-Cap (Carvedilol Phosphate) .Marland Kitchen.. 1 Once Daily 3)  Remeron 15 Mg  Tabs (Mirtazapine) .... Once Daily 4)  Adult Aspirin Low Strength 81 Mg  Tbdp (Aspirin) .... Once Daily 5)  Fish Oil Concentrate 1000 Mg  Caps (Omega-3 Fatty Acids) .... One By Mouth Two Times A Day With Food 6)  Loprox 1 %  Sham (Ciclopirox) .... Apply To Scalp As Directed 7)  Clindagel 1 %  Gel  (Clindamycin Phosphate) .... Apply To Face Daily 8)  Nasacort Aq 55 Mcg/act Aers (Triamcinolone Acetonide(Nasal)) .... 2 Spray Each Nostril Once Daily Alternating Daily With Nasonex 9)  Nasonex 50 Mcg/act Susp (Mometasone Furoate) .... 2 Sprays in Each Nostril Once Daily Alternating With Nasocort  Allergies (verified): No Known Drug Allergies  Past History:  Family History:    Family History of Colon CA 1st degree relative <60    Family History Other cancer,  father ssca of mouth     (06/18/2007)  Social History:    Married    Never Smoked    Alcohol use-yes    Drug use-no    Regular exercise-yes     (06/18/2007)  Risk Factors:    Alcohol Use: N/A    >5 drinks/d w/in last 3 months: N/A    Caffeine Use: N/A    Diet: N/A    Exercise: yes (06/18/2007)  Risk Factors:    Smoking Status: never (06/18/2007)    Packs/Day: N/A    Cigars/wk: N/A    Pipe Use/wk: N/A    Cans of tobacco/wk: N/A    Passive Smoke Exposure: N/A  Past medical, surgical, family and social histories (including risk factors) reviewed, and no changes  noted (except as noted below).  Past Medical History:    Reviewed history from 06/18/2007 and no changes required:    Hyperlipidemia    Hypertension    Valvular Heart Disease with Mitral Valve Prolapse 424.90    Chorda Rupture    Valvuloplasty with Placement of Cosgrove ring  Past Surgical History:    Reviewed history from 06/18/2007 and no changes required:    valvuloplasty with replacement of cosgrove ring , l congenital rubture of cordai    Appendectomy  Family History:    Reviewed history from 06/18/2007 and no changes required:       Family History of Colon CA 1st degree relative <60       Family History Other cancer,  father ssca of mouth  Social History:    Reviewed history from 06/18/2007 and no changes required:       Married       Never Smoked       Alcohol use-yes       Drug use-no       Regular exercise-yes  Review of  Systems  The patient denies anorexia, fever, weight loss, weight gain, vision loss, decreased hearing, hoarseness, chest pain, syncope, dyspnea on exertion, peripheral edema, prolonged cough, headaches, hemoptysis, abdominal pain, melena, hematochezia, severe indigestion/heartburn, hematuria, incontinence, genital sores, muscle weakness, suspicious skin lesions, transient blindness, difficulty walking, depression, unusual weight change, abnormal bleeding, enlarged lymph nodes, angioedema, breast masses, and testicular masses.    Physical Exam  General:  Well-developed,well-nourished,in no acute distress; alert,appropriate and cooperative throughout examination Head:  focal alopecia and male-pattern balding.   Eyes:  pupils equal and pupils round.   Ears:  R ear normal and L ear normal.   Nose:  no nasal discharge.   Mouth:  pharynx pink and moist.   Neck:  No deformities, masses, or tenderness noted. Lungs:  Normal respiratory effort, chest expands symmetrically. Lungs are clear to auscultation, no crackles or wheezes. Heart:  Normal rate and regular rhythm. S1 and S2 normal without gallop, murmur, click, rub or other extra sounds. Abdomen:  Bowel sounds positive,abdomen soft and non-tender without masses, organomegaly or hernias noted. Pulses:  R and L carotid,radial,femoral,dorsalis pedis and posterior tibial pulses are full and equal bilaterally Neurologic:  No cranial nerve deficits noted. Station and gait are normal. Plantar reflexes are down-going bilaterally. DTRs are symmetrical throughout. Sensory, motor and coordinative functions appear intact.   Impression & Recommendations:  Problem # 1:  PREVENTIVE HEALTH CARE (ICD-V70.0)  Colonoscopy: Results: Normal.  (11/18/2005) Td Booster: Td (05/23/2001)   Chol: 186 (07/08/2008)   HDL: 38.50 (07/08/2008)   LDL: 127 (07/08/2008)   TG: 104.0 (07/08/2008) TSH: 1.67 (07/08/2008)   PSA: 1.35 (07/08/2008) Next Colonoscopy due:: 11/2008  (06/18/2007)  Discussed using sunscreen, use of alcohol, drug use, self testicular exam, routine dental care, routine eye care, routine physical exam, seat belts, multiple vitamins, osteoporosis prevention, adequate calcium intake in diet, and recommendations for immunizations.  Discussed exercise and checking cholesterol.  Discussed gun safety, safe sex, and contraception. Also recommend checking PSA.  Problem # 2:  HYPERLIPIDEMIA (ICD-272.4)  Labs Reviewed: SGOT: 22 (07/08/2008)   SGPT: 27 (07/08/2008)  Lipid Goals: Chol Goal: 200 (06/18/2007)   HDL Goal: 40 (06/18/2007)   LDL Goal: 130 (06/18/2007)   TG Goal: 150 (06/18/2007)  Prior 10 Yr Risk Heart Disease: Not enough information (06/18/2007)  Problem # 3:  MITRAL VALVE PROLAPSE (ICD-424.0)  His updated medication list for this problem includes:  Coreg Cr 20 Mg Xr24h-cap (Carvedilol phosphate) .Marland Kitchen... 1 once daily    Adult Aspirin Low Strength 81 Mg Tbdp (Aspirin) ..... Once daily  Complete Medication List: 1)  Proctosert Hc 30 Mg Supp (Hydrocortisone acetate) .... Use as directed 2)  Coreg Cr 20 Mg Xr24h-cap (Carvedilol phosphate) .Marland Kitchen.. 1 once daily 3)  Remeron 15 Mg Tabs (Mirtazapine) .... Once daily 4)  Adult Aspirin Low Strength 81 Mg Tbdp (Aspirin) .... Once daily 5)  Fish Oil Concentrate 1000 Mg Caps (Omega-3 fatty acids) .... One by mouth two times a day with food 6)  Loprox 1 % Sham (Ciclopirox) .... Apply to scalp as directed 7)  Clindagel 1 % Gel (Clindamycin phosphate) .... Apply to face daily 8)  Nasacort Aq 55 Mcg/act Aers (Triamcinolone acetonide(nasal)) .... 2 spray each nostril once daily alternating daily with nasonex 9)  Nasonex 50 Mcg/act Susp (Mometasone furoate) .... 2 sprays in each nostril once daily alternating with nasocort 10)  Hydrocortisone Ace-pramoxine 2.5-1 % Crea (Hydrocortisone ace-pramoxine) .... Apply pr two times a day  Patient Instructions: 1)  Please schedule a follow-up appointment in 6  months. Prescriptions: HYDROCORTISONE ACE-PRAMOXINE 2.5-1 % CREA (HYDROCORTISONE ACE-PRAMOXINE) apply PR two times a day  #1 tube x 11   Entered and Authorized by:   Stacie Glaze MD   Signed by:   Stacie Glaze MD on 07/15/2008   Method used:   Electronically to        [facility] [phone]* (retail)       [address] [place], [place]  [address]       Ph: [phone] or [phone]       Fax: [phone]   RxID:   [id number] NASONEX 50 MCG/ACT SUSP (MOMETASONE FUROATE) 2 sprays in each nostril once daily alternating with nasocort  #1 x 6   Entered by:   Willy Eddy, LPN   Authorized by:   Stacie Glaze MD   Signed by:   Willy Eddy, LPN on 22/05/5425   Method used:   Electronically to        [facility] [phone]* (retail)       [address]       [place], [place]  [address]       Ph: [phone] or [phone]       Fax: [phone]   RxID:   [id number] NASACORT AQ 55 MCG/ACT AERS (TRIAMCINOLONE ACETONIDE(NASAL)) 2 spray each nostril once daily alternating daily with nasonex  #1 x 6   Entered by:   Willy Eddy, LPN   Authorized by:   Stacie Glaze MD   Signed by:   Willy Eddy, LPN on 38/01/1750   Method used:   Electronically to        [facility] [phone]* (retail)       [address] [place], [place]  [address]       Ph: [phone] or [phone]       Fax: [phone]   RxID:   [id number] CLINDAGEL 1 %  GEL (CLINDAMYCIN PHOSPHATE) apply to face daily  #60gm x 11   Entered by:   Willy Eddy, LPN   Authorized by:   Stacie Glaze MD  Signed by:   Willy Eddy, LPN on 04/54/0981   Method used:   Electronically to        [facility] [phone]* (retail)       [address] [place], [place]  [address]       Ph: [phone] or [phone]       Fax: [phone]   RxID:   [id number] LOPROX 1 %  SHAM (CICLOPIROX)  apply to scalp as directed  #1 bottle x 11   Entered by:   Willy Eddy, LPN   Authorized by:   Stacie Glaze MD   Signed by:   Willy Eddy, LPN on 63/87/5643   Method used:   Electronically to        [facility] [phone]* (retail)       [address]       [place], [place]  [address]       Ph: [phone] or [phone]       Fax: [phone]   RxID:   [id number] REMERON 15 MG  TABS (MIRTAZAPINE) once daily  #30 Tablet x 6   Entered by:   Willy Eddy, LPN   Authorized by:   Stacie Glaze MD   Signed by:   Willy Eddy, LPN on 71/09/2692   Method used:   Electronically to        [facility] [phone]* (retail)       [address]       [place], [place]  [address]       Ph: [phone] or [phone]       Fax: [phone]   RxID:   [id number] COREG CR 20 MG XR24H-CAP (CARVEDILOL PHOSPHATE) 1 once daily  #30 x 11   Entered by:   Willy Eddy, LPN   Authorized by:   Stacie Glaze MD   Signed by:   Willy Eddy, LPN on 31/54/0086   Method used:   Electronically to        [facility] [phone]* (retail)       [address]       [place], [place]  [address]       Ph: [phone] or [phone]       Fax: [phone]   RxID:   [id number] PROCTOSERT HC 30 MG  SUPP (HYDROCORTISONE ACETATE) use as directed  #14 x 3   Entered by:   Willy Eddy, LPN   Authorized by:   Stacie Glaze MD   Signed by:   Willy Eddy, LPN on 83/41/9622   Method used:   Electronically to        [facility] [phone]* (retail)       [address] [place], [place]  [address]       Ph: [phone] or [phone]       Fax: [phone]   RxID:   [id number]

## 2010-05-23 NOTE — Progress Notes (Signed)
Summary: refill  Phone Note Call from Patient Call back at Home Phone [phone]   Caller: pt live Call For: Lovell Sheehan Reason for Call: Lab or Test Results, Insurance Question, Privacy/Consent Authorization Summary of Call: Proctosert HC 30 mg  [facility] [phone] Initial call taken by: Roselle Locus,  January 22, 2008 3:16 PM      Prescriptions: PROCTOSERT HC 30 MG  SUPP (HYDROCORTISONE ACETATE) use as directed  #14 x 3   Entered by:   Willy Eddy, LPN   Authorized by:   Stacie Glaze MD   Signed by:   Willy Eddy, LPN on 84/69/6295   Method used:   Telephoned to ...       [facility] [phone]* (retail)       [address] [place], [place]  [address]       Ph: [phone] or [phone]       Fax: [phone]   RxID:   [id number] PROCTOSERT HC 30 MG  SUPP (HYDROCORTISONE ACETATE) use as directed  #14 x 3   Entered by:   Willy Eddy, LPN   Authorized by:   Benancio Deeds [place]   Signed by:   Willy Eddy, LPN on 10/93/2355   Method used:   Telephoned to ...       [facility] [phone]* (retail)       [address] [place], [place]  [address]       Ph: [phone] or [phone]       Fax: [phone]   RxID:   [id number]

## 2010-05-23 NOTE — Consult Note (Signed)
Summary: Surgical Consultation/Dr. Donnalee Curry   Surgical Consultation/Dr. Donnalee Curry   Imported By: Maryln Gottron 06/30/2008 14:30:55  _____________________________________________________________________  External Attachment:    Type:   Image     Comment:   External Document

## 2010-05-23 NOTE — Medication Information (Signed)
Summary: Coverage Approval for Nasacort Aq/[employer]  Coverage Approval for Nasacort Aq/[employer]   Imported By: Maryln Gottron 05/09/2009 13:40:33  _____________________________________________________________________  External Attachment:    Type:   Image     Comment:   External Document

## 2010-05-23 NOTE — Progress Notes (Signed)
Summary: cpx labs at [facility] Note Call from Patient Call back at [phone]   Caller: scott keener vm triage Call For: jenkins Complaint: Breathing Problems Summary of Call: pt has cpx coming up soon.  Has had a hard time getting in to get his bloodwork done.  Dr Lovell Sheehan said he could have his labs done at [facility] and [place] [facility] is to arrange per Dr Lovell Sheehan.  Pt can go on Friday am.  Please call and set up for him  Initial call taken by: Roselle Locus,  July 06, 2008 9:04 AM  Follow-up for Phone Call        scheduled for 8;25 at [facility] panel,tsh,lipid,liver psa and ua orderd, pt informed Follow-up by: Willy Eddy, LPN,  July 06, 2008 11:09 AM

## 2010-05-23 NOTE — Assessment & Plan Note (Signed)
Summary: 3 month rov/njr---PT [facility] // RS   Vital Signs:  Patient profile:   60 year old male Height:      66 inches Weight:      154 pounds BMI:     24.95 Temp:     98.2 degrees F oral Pulse rate:   72 / minute Resp:     14 per minute BP sitting:   110 / 70  (left arm)  Vitals Entered By: Willy Eddy, LPN (February 05, 2010 2:54 PM) CC: roa labs, Lipid Management, Hypertension Management Is Patient Diabetic? No   Primary Care Coreen Shippee:  Stacie Glaze MD  CC:  roa labs, Lipid Management, and Hypertension Management.  History of Present Illness: stopped the statin due to gal stone attack and resultant surgery did not resume the medications  Hyperlipidemia Follow-Up      This is a 60 year old man who presents for Hyperlipidemia follow-up.  The patient denies muscle aches, GI upset, abdominal pain, flushing, itching, constipation, diarrhea, and fatigue.  The patient denies the following symptoms: chest pain/pressure, exercise intolerance, dypsnea, palpitations, syncope, and pedal edema.  Compliance with medications (by patient report) has been poor.  Dietary compliance has been good.  The patient reports exercising 3-4X per week.  Adjunctive measures currently used by the patient include weight reduction.    Hypertension History:      He denies headache, chest pain, palpitations, dyspnea with exertion, orthopnea, PND, peripheral edema, visual symptoms, neurologic problems, syncope, and side effects from treatment.        Positive major cardiovascular risk factors include male age 33 years old or older, hyperlipidemia, and hypertension.  Negative major cardiovascular risk factors include non-tobacco-user status.        Further assessment for target organ damage reveals no history of ASHD, stroke/TIA, or peripheral vascular disease.    Lipid Management History:      Positive NCEP/ATP III risk factors include male age 49 years old or older, HDL cholesterol less than 40, and  hypertension.  Negative NCEP/ATP III risk factors include non-tobacco-user status, no ASHD (atherosclerotic heart disease), no prior stroke/TIA, no peripheral vascular disease, and no history of aortic aneurysm.      Preventive Screening-Counseling & Management  Alcohol-Tobacco     Smoking Status: never     Tobacco Counseling: not indicated; no tobacco use  Problems Prior to Update: 1)  Choledocholithiasis, Hx of  (ICD-V12.79) 2)  Uns Advrs Eff Uns Rx Medicinal&biological Sbstnc  (ICD-995.20) 3)  Preventive Health Care  (ICD-V70.0) 4)  Family History of Colon Ca 1st Degree Relative <60  (ICD-V16.0) 5)  Mitral Valve Prolapse  (ICD-424.0) 6)  Valvular Heart Disease  (ICD-424.90) 7)  Hypertension  (ICD-401.9) 8)  Hyperlipidemia  (ICD-272.4)  Current Problems (verified): 1)  Uns Advrs Eff Uns Rx Medicinal&biological Sbstnc  (ICD-995.20) 2)  Preventive Health Care  (ICD-V70.0) 3)  Family History of Colon Ca 1st Degree Relative <60  (ICD-V16.0) 4)  Mitral Valve Prolapse  (ICD-424.0) 5)  Valvular Heart Disease  (ICD-424.90) 6)  Hypertension  (ICD-401.9) 7)  Hyperlipidemia  (ICD-272.4)  Medications Prior to Update: 1)  Coreg Cr 20 Mg Xr24h-Cap (Carvedilol Phosphate) .Marland Kitchen.. 1 Once Daily 2)  Remeron 15 Mg  Tabs (Mirtazapine) .... Once Daily 3)  Adult Aspirin Low Strength 81 Mg  Tbdp (Aspirin) .... Once Daily 4)  Fish Oil Concentrate 1000 Mg  Caps (Omega-3 Fatty Acids) .... One By Mouth Two Times A Day With Food 5)  Loprox 1 %  Sham (Ciclopirox) .... Apply To Scalp As Directed 6)  Clindagel 1 %  Gel (Clindamycin Phosphate) .... Apply To Face Daily 7)  Nasacort Aq 55 Mcg/act Aers (Triamcinolone Acetonide(Nasal)) .... 2 Spray Each Nostril Once Daily Alternating Daily With Nasonex 8)  Nasonex 50 Mcg/act Susp (Mometasone Furoate) .... 2 Sprays in Each Nostril Once Daily Alternating With Nasocort 9)  Hydrocortisone Ace-Pramoxine 2.5-1 % Crea (Hydrocortisone Ace-Pramoxine) .... Apply Pr Two Times  A Day 10)  Livalo 2 Mg Tabs (Pitavastatin Calcium) .... One By Mouth Q Week  Current Medications (verified): 1)  Coreg Cr 20 Mg Xr24h-Cap (Carvedilol Phosphate) .Marland Kitchen.. 1 Once Daily 2)  Remeron 15 Mg  Tabs (Mirtazapine) .... Once Daily 3)  Adult Aspirin Low Strength 81 Mg  Tbdp (Aspirin) .... Once Daily 4)  Fish Oil Concentrate 1000 Mg  Caps (Omega-3 Fatty Acids) .... One By Mouth Two Times A Day With Food 5)  Loprox 1 %  Sham (Ciclopirox) .... Apply To Scalp As Directed 6)  Clindagel 1 %  Gel (Clindamycin Phosphate) .... Apply To Face Daily 7)  Nasacort Aq 55 Mcg/act Aers (Triamcinolone Acetonide(Nasal)) .... 2 Spray Each Nostril Once Daily Alternating Daily With Nasonex 8)  Nasonex 50 Mcg/act Susp (Mometasone Furoate) .... 2 Sprays in Each Nostril Once Daily Alternating With Nasocort 9)  Hydrocortisone Ace-Pramoxine 2.5-1 % Crea (Hydrocortisone Ace-Pramoxine) .... Apply Pr Two Times A Day 10)  Livalo 2 Mg Tabs (Pitavastatin Calcium) .... One By Mouth Q Week  Allergies (verified): No Known Drug Allergies  Past History:  Family History: Last updated: 06/18/2007 Family History of Colon CA 1st degree relative <60 Family History Other cancer,  father ssca of mouth  Social History: Last updated: 06/18/2007 Married Never Smoked Alcohol use-yes Drug use-no Regular exercise-yes  Risk Factors: Exercise: yes (06/18/2007)  Risk Factors: Smoking Status: never (02/05/2010)  Past medical, surgical, family and social histories (including risk factors) reviewed, and no changes noted (except as noted below).  Past Medical History: Reviewed history from 06/18/2007 and no changes required. Hyperlipidemia Hypertension Valvular Heart Disease with Mitral Valve Prolapse 424.90 Chorda Rupture Valvuloplasty with Placement of Cosgrove ring  Past Surgical History: Reviewed history from 06/18/2007 and no changes required. valvuloplasty with replacement of cosgrove ring , l congenital rubture of  cordai Appendectomy  Family History: Reviewed history from 06/18/2007 and no changes required. Family History of Colon CA 1st degree relative <60 Family History Other cancer,  father ssca of mouth  Social History: Reviewed history from 06/18/2007 and no changes required. Married Never Smoked Alcohol use-yes Drug use-no Regular exercise-yes  Review of Systems  The patient denies anorexia, fever, weight loss, weight gain, vision loss, decreased hearing, hoarseness, chest pain, syncope, dyspnea on exertion, peripheral edema, prolonged cough, headaches, hemoptysis, abdominal pain, melena, hematochezia, severe indigestion/heartburn, hematuria, incontinence, genital sores, muscle weakness, suspicious skin lesions, transient blindness, difficulty walking, depression, unusual weight change, abnormal bleeding, enlarged lymph nodes, angioedema, and breast masses.         Flu Vaccine Consent Questions     Do you have a history of severe allergic reactions to this vaccine? no    Any prior history of allergic reactions to egg and/or gelatin? no    Do you have a sensitivity to the preservative Thimersol? no    Do you have a past history of Guillan-Barre Syndrome? no    Do you currently have an acute febrile illness? no    Have you ever had a severe reaction to latex? no  Vaccine information given and explained to patient? yes    Are you currently pregnant? no    Lot Number:[id number]   Exp Date:10/20/2010   Site Given  Left Deltoid IM   Physical Exam  General:  Well-developed,well-nourished,in no acute distress; alert,appropriate and cooperative throughout examination Head:  focal alopecia and male-pattern balding.   Eyes:  pupils equal and pupils round.   Ears:  R ear normal and L ear normal.   Nose:  no nasal discharge.   Mouth:  pharynx pink and moist.   Neck:  No deformities, masses, or tenderness noted. Lungs:  Normal respiratory effort, chest expands symmetrically. Lungs are clear  to auscultation, no crackles or wheezes. Heart:  Normal rate and regular rhythm. S1 and S2 normal without gallop, murmur, click, rub or other extra sounds. Abdomen:  Bowel sounds positive,abdomen soft and non-tender without masses, organomegaly or hernias noted.   Impression & Recommendations:  Problem # 1:  UNS ADVRS EFF UNS RX MEDICINAL&BIOLOGICAL SBSTNC (ICD-995.20) liver fucntion normal post cholecystectomy  Problem # 2:  HYPERTENSION (ICD-401.9)  His updated medication list for this problem includes:    Coreg Cr 20 Mg Xr24h-cap (Carvedilol phosphate) .Marland Kitchen... 1 once daily  BP today: 110/70 Prior BP: 104/70 (10/17/2009)  10 Yr Risk Heart Disease: 11 % Prior 10 Yr Risk Heart Disease: 9 % (10/17/2009)  Labs Reviewed: K+: 4.0 (10/09/2009) Creat: : 0.9 (10/09/2009)   Chol: 204 (01/16/2010)   HDL: 38.40 (01/16/2010)   LDL: 127 (07/08/2008)   TG: 143.0 (01/16/2010)  Problem # 3:  HYPERLIPIDEMIA (ICD-272.4)  His updated medication list for this problem includes:    Livalo 2 Mg Tabs (Pitavastatin calcium) ..... One by mouth q week  Labs Reviewed: SGOT: 23 (01/16/2010)   SGPT: 29 (01/16/2010)  Lipid Goals: Chol Goal: 200 (06/18/2007)   HDL Goal: 40 (06/18/2007)   LDL Goal: 130 (06/18/2007)   TG Goal: 150 (06/18/2007)  10 Yr Risk Heart Disease: 11 % Prior 10 Yr Risk Heart Disease: 9 % (10/17/2009)   HDL:38.40 (01/16/2010), 47.80 (10/09/2009)  LDL:127 (07/08/2008), 124 (10/09/2007)  Chol:204 (01/16/2010), 218 (10/09/2009)  Trig:143.0 (01/16/2010), 169.0 (10/09/2009)  Complete Medication List: 1)  Coreg Cr 20 Mg Xr24h-cap (Carvedilol phosphate) .Marland Kitchen.. 1 once daily 2)  Remeron 15 Mg Tabs (Mirtazapine) .... Once daily 3)  Adult Aspirin Low Strength 81 Mg Tbdp (Aspirin) .... Once daily 4)  Fish Oil Concentrate 1000 Mg Caps (Omega-3 fatty acids) .... One by mouth two times a day with food 5)  Loprox 1 % Sham (Ciclopirox) .... Apply to scalp as directed 6)  Clindagel 1 % Gel (Clindamycin  phosphate) .... Apply to face daily 7)  Nasacort Aq 55 Mcg/act Aers (Triamcinolone acetonide(nasal)) .... 2 spray each nostril once daily alternating daily with nasonex 8)  Nasonex 50 Mcg/act Susp (Mometasone furoate) .... 2 sprays in each nostril once daily alternating with nasocort 9)  Hydrocortisone Ace-pramoxine 2.5-1 % Crea (Hydrocortisone ace-pramoxine) .... Apply pr two times a day 10)  Livalo 2 Mg Tabs (Pitavastatin calcium) .... One by mouth q week  Other Orders: Admin 1st Vaccine (95621) Flu Vaccine 50yrs + (30865)  Hypertension Assessment/Plan:      The patient's hypertensive risk group is category B: At least one risk factor (excluding diabetes) with no target organ damage.  His calculated 10 year risk of coronary heart disease is 11 %.  Today's blood pressure is 110/70.  His blood pressure goal is < 140/90.  Lipid Assessment/Plan:      Based on  NCEP/ATP III, the patient's risk factor category is "0-1 risk factors".  The patient's lipid goals are as follows: Total cholesterol goal is 200; LDL cholesterol goal is 130; HDL cholesterol goal is 40; Triglyceride goal is 150.  His LDL cholesterol goal has been met.    Patient Instructions: 1)  FEB CPX   Orders Added: 1)  Admin 1st Vaccine [90471] 2)  Flu Vaccine 44yrs + [14782] 3)  Est. Patient Level IV [95621]

## 2010-05-23 NOTE — Consult Note (Signed)
Summary: [facility] note  [facility] note   Imported By: Kassie Mends 07/06/2007 09:52:01  _____________________________________________________________________  External Attachment:    Type:   Image     Comment:   [facility] note

## 2010-05-23 NOTE — Assessment & Plan Note (Signed)
Summary: cpx/njr/pt rsc/cjr   Vital Signs:  Patient profile:   60 year old male Height:      66 inches Weight:      153 pounds BMI:     24.78 Temp:     98.2 degrees F oral Pulse rate:   72 / minute Resp:     14 per minute BP sitting:   104 / 70  (left arm)  Vitals Entered By: Willy Eddy, LPN (October 17, 2009 2:36 PM) CC: CPX- CARDIOLOGIST IN [place], Hypertension Management   CC:  CPX- CARDIOLOGIST IN [place] and Hypertension Management.  History of Present Illness: The pt was asked about all immunizations, health maint. services that are appropriate to their age and was given guidance on diet exercize  and weight management   Hyperlipidemia Follow-Up      This is a 60 year old man who presents for Hyperlipidemia follow-up.  The patient denies muscle aches, GI upset, abdominal pain, flushing, itching, constipation, diarrhea, and fatigue.  The patient denies the following symptoms: chest pain/pressure, exercise intolerance, dypsnea, palpitations, syncope, and pedal edema.  Compliance with medications (by patient report) has been near 100% and intermittent.  Dietary compliance has been fair.  The patient reports exercising occasionally.  Adjunctive measures currently used by the patient include limiting alcohol consumpton and weight reduction.    Hypertension History:      He denies headache, chest pain, palpitations, dyspnea with exertion, orthopnea, PND, peripheral edema, visual symptoms, neurologic problems, syncope, and side effects from treatment.        Positive major cardiovascular risk factors include male age 60 years old or older, hyperlipidemia, and hypertension.  Negative major cardiovascular risk factors include non-tobacco-user status.        Further assessment for target organ damage reveals no history of ASHD, stroke/TIA, or peripheral vascular disease.     Preventive Screening-Counseling & Management  Alcohol-Tobacco     Smoking Status: never  Problems  Prior to Update: 1)  Uns Advrs Eff Uns Rx Medicinal&biological Sbstnc  (ICD-995.20) 2)  Preventive Health Care  (ICD-V70.0) 3)  Family History of Colon Ca 1st Degree Relative <60  (ICD-V16.0) 4)  Mitral Valve Prolapse  (ICD-424.0) 5)  Valvular Heart Disease  (ICD-424.90) 6)  Hypertension  (ICD-401.9) 7)  Hyperlipidemia  (ICD-272.4)  Current Problems (verified): 1)  Uns Advrs Eff Uns Rx Medicinal&biological Sbstnc  (ICD-995.20) 2)  Preventive Health Care  (ICD-V70.0) 3)  Family History of Colon Ca 1st Degree Relative <60  (ICD-V16.0) 4)  Mitral Valve Prolapse  (ICD-424.0) 5)  Valvular Heart Disease  (ICD-424.90) 6)  Hypertension  (ICD-401.9) 7)  Hyperlipidemia  (ICD-272.4)  Medications Prior to Update: 1)  Proctosert Hc 30 Mg  Supp (Hydrocortisone Acetate) .... Use As Directed 2)  Coreg Cr 20 Mg Xr24h-Cap (Carvedilol Phosphate) .Marland Kitchen.. 1 Once Daily 3)  Remeron 15 Mg  Tabs (Mirtazapine) .... Once Daily 4)  Adult Aspirin Low Strength 81 Mg  Tbdp (Aspirin) .... Once Daily 5)  Fish Oil Concentrate 1000 Mg  Caps (Omega-3 Fatty Acids) .... One By Mouth Two Times A Day With Food 6)  Loprox 1 %  Sham (Ciclopirox) .... Apply To Scalp As Directed 7)  Clindagel 1 %  Gel (Clindamycin Phosphate) .... Apply To Face Daily 8)  Nasacort Aq 55 Mcg/act Aers (Triamcinolone Acetonide(Nasal)) .... 2 Spray Each Nostril Once Daily Alternating Daily With Nasonex 9)  Nasonex 50 Mcg/act Susp (Mometasone Furoate) .... 2 Sprays in Each Nostril Once Daily Alternating With Nasocort  10)  Hydrocortisone Ace-Pramoxine 2.5-1 % Crea (Hydrocortisone Ace-Pramoxine) .... Apply Pr Two Times A Day  Current Medications (verified): 1)  Coreg Cr 20 Mg Xr24h-Cap (Carvedilol Phosphate) .Marland Kitchen.. 1 Once Daily 2)  Remeron 15 Mg  Tabs (Mirtazapine) .... Once Daily 3)  Adult Aspirin Low Strength 81 Mg  Tbdp (Aspirin) .... Once Daily 4)  Fish Oil Concentrate 1000 Mg  Caps (Omega-3 Fatty Acids) .... One By Mouth Two Times A Day With Food 5)   Loprox 1 %  Sham (Ciclopirox) .... Apply To Scalp As Directed 6)  Clindagel 1 %  Gel (Clindamycin Phosphate) .... Apply To Face Daily 7)  Nasacort Aq 55 Mcg/act Aers (Triamcinolone Acetonide(Nasal)) .... 2 Spray Each Nostril Once Daily Alternating Daily With Nasonex 8)  Nasonex 50 Mcg/act Susp (Mometasone Furoate) .... 2 Sprays in Each Nostril Once Daily Alternating With Nasocort 9)  Hydrocortisone Ace-Pramoxine 2.5-1 % Crea (Hydrocortisone Ace-Pramoxine) .... Apply Pr Two Times A Day  Allergies (verified): No Known Drug Allergies  Past History:  Family History: Last updated: 06/18/2007 Family History of Colon CA 1st degree relative <60 Family History Other cancer,  father ssca of mouth  Social History: Last updated: 06/18/2007 Married Never Smoked Alcohol use-yes Drug use-no Regular exercise-yes  Risk Factors: Exercise: yes (06/18/2007)  Risk Factors: Smoking Status: never (10/17/2009)  Past medical, surgical, family and social histories (including risk factors) reviewed, and no changes noted (except as noted below).  Past Medical History: Reviewed history from 06/18/2007 and no changes required. Hyperlipidemia Hypertension Valvular Heart Disease with Mitral Valve Prolapse 424.90 Chorda Rupture Valvuloplasty with Placement of Cosgrove ring  Past Surgical History: Reviewed history from 06/18/2007 and no changes required. valvuloplasty with replacement of cosgrove ring , l congenital rubture of cordai Appendectomy  Family History: Reviewed history from 06/18/2007 and no changes required. Family History of Colon CA 1st degree relative <60 Family History Other cancer,  father ssca of mouth  Social History: Reviewed history from 06/18/2007 and no changes required. Married Never Smoked Alcohol use-yes Drug use-no Regular exercise-yes  Review of Systems  The patient denies anorexia, fever, weight loss, weight gain, vision loss, decreased hearing, hoarseness,  chest pain, syncope, dyspnea on exertion, peripheral edema, prolonged cough, headaches, hemoptysis, abdominal pain, melena, hematochezia, severe indigestion/heartburn, hematuria, incontinence, genital sores, muscle weakness, suspicious skin lesions, transient blindness, difficulty walking, depression, unusual weight change, abnormal bleeding, enlarged lymph nodes, angioedema, breast masses, and testicular masses.    Physical Exam  General:  Well-developed,well-nourished,in no acute distress; alert,appropriate and cooperative throughout examination Head:  focal alopecia and male-pattern balding.   Eyes:  pupils equal and pupils round.   Ears:  R ear normal and L ear normal.   Nose:  no nasal discharge.   Mouth:  pharynx pink and moist.   Neck:  No deformities, masses, or tenderness noted. Lungs:  Normal respiratory effort, chest expands symmetrically. Lungs are clear to auscultation, no crackles or wheezes. Heart:  Normal rate and regular rhythm. S1 and S2 normal without gallop, murmur, click, rub or other extra sounds. Abdomen:  Bowel sounds positive,abdomen soft and non-tender without masses, organomegaly or hernias noted. Pulses:  R and L carotid,radial,femoral,dorsalis pedis and posterior tibial pulses are full and equal bilaterally Neurologic:  No cranial nerve deficits noted. Station and gait are normal. Plantar reflexes are down-going bilaterally. DTRs are symmetrical throughout. Sensory, motor and coordinative functions appear intact.   Impression & Recommendations:  Problem # 1:  PREVENTIVE HEALTH CARE (ICD-V70.0)  Colonoscopy: Results: Normal.  (11/18/2005)  Td Booster: Td (05/23/2001)   Flu Vax: Historical (01/20/2009)   Chol: 218 (10/09/2009)   HDL: 47.80 (10/09/2009)   LDL: 127 (07/08/2008)   TG: 169.0 (10/09/2009) TSH: 2.07 (10/09/2009)   PSA: 1.80 (10/09/2009) Next Colonoscopy due:: 11/2008 (06/18/2007)  Discussed using sunscreen, use of alcohol, drug use, self testicular  exam, routine dental care, routine eye care, routine physical exam, seat belts, multiple vitamins, osteoporosis prevention, adequate calcium intake in diet, and recommendations for immunizations.  Discussed exercise and checking cholesterol.  Discussed gun safety, safe sex, and contraception. Also recommend checking PSA.  Problem # 2:  HYPERTENSION (ICD-401.9)  His updated medication list for this problem includes:    Coreg Cr 20 Mg Xr24h-cap (Carvedilol phosphate) .Marland Kitchen... 1 once daily  BP today: 104/70 Prior BP: 116/74 (07/15/2008)  10 Yr Risk Heart Disease: 9 % Prior 10 Yr Risk Heart Disease: Not enough information (06/18/2007)  Labs Reviewed: K+: 4.0 (10/09/2009) Creat: : 0.9 (10/09/2009)   Chol: 218 (10/09/2009)   HDL: 47.80 (10/09/2009)   LDL: 127 (07/08/2008)   TG: 169.0 (10/09/2009)  Problem # 3:  HYPERLIPIDEMIA (ICD-272.4) add livalo Labs Reviewed: SGOT: 24 (10/09/2009)   SGPT: 24 (10/09/2009)  Lipid Goals: Chol Goal: 200 (06/18/2007)   HDL Goal: 40 (06/18/2007)   LDL Goal: 130 (06/18/2007)   TG Goal: 150 (06/18/2007)  10 Yr Risk Heart Disease: 9 % Prior 10 Yr Risk Heart Disease: Not enough information (06/18/2007)   HDL:47.80 (10/09/2009), 38.50 (07/08/2008)  LDL:127 (07/08/2008), 124 (10/09/2007)  Chol:218 (10/09/2009), 186 (07/08/2008)  Trig:169.0 (10/09/2009), 104.0 (07/08/2008)  His updated medication list for this problem includes:    Livalo 2 Mg Tabs (Pitavastatin calcium) ..... One by mouth q week  Complete Medication List: 1)  Coreg Cr 20 Mg Xr24h-cap (Carvedilol phosphate) .Marland Kitchen.. 1 once daily 2)  Remeron 15 Mg Tabs (Mirtazapine) .... Once daily 3)  Adult Aspirin Low Strength 81 Mg Tbdp (Aspirin) .... Once daily 4)  Fish Oil Concentrate 1000 Mg Caps (Omega-3 fatty acids) .... One by mouth two times a day with food 5)  Loprox 1 % Sham (Ciclopirox) .... Apply to scalp as directed 6)  Clindagel 1 % Gel (Clindamycin phosphate) .... Apply to face daily 7)  Nasacort Aq  55 Mcg/act Aers (Triamcinolone acetonide(nasal)) .... 2 spray each nostril once daily alternating daily with nasonex 8)  Nasonex 50 Mcg/act Susp (Mometasone furoate) .... 2 sprays in each nostril once daily alternating with nasocort 9)  Hydrocortisone Ace-pramoxine 2.5-1 % Crea (Hydrocortisone ace-pramoxine) .... Apply pr two times a day 10)  Livalo 2 Mg Tabs (Pitavastatin calcium) .... One by mouth q week  Hypertension Assessment/Plan:      The patient's hypertensive risk group is category B: At least one risk factor (excluding diabetes) with no target organ damage.  His calculated 10 year risk of coronary heart disease is 9 %.  Today's blood pressure is 104/70.  His blood pressure goal is < 140/90.  Patient Instructions: 1)  Please schedule a follow-up appointment in 3 months. 2)  Hepatic Panel prior to visit, ICD-9:995.20 3)  Lipid Panel prior to visit, ICD-9:272.4 Prescriptions: HYDROCORTISONE ACE-PRAMOXINE 2.5-1 % CREA (HYDROCORTISONE ACE-PRAMOXINE) apply PR two times a day  #1 tube x 11   Entered by:   Willy Eddy, LPN   Authorized by:   Stacie Glaze MD   Signed by:   Willy Eddy, LPN on 40/98/1191   Method used:   Electron[facility] to        [facility] [phone]* (retail)       [address] [place], [place]  [address]       Ph: [phone] or [phone]       Fax: [phone]   RxID:   [id number] NASONEX 50 MCG/ACT SUSP (MOMETASONE FUROATE) 2 sprays in each nostril once daily alternating with nasocort  #1 x 6   Entered by:   Willy Eddy, LPN   Authorized by:   Stacie Glaze MD   Signed by:   Willy Eddy, LPN on 36/64/4034   Method used:   Electron[facility] to        [facility] [phone]* (retail)       [address]       [place], [place]  [address]       Ph: [phone] or [phone]       Fax: [phone]   RxID:   [id number] NASACORT AQ 55 MCG/ACT AERS (TRIAMCINOLONE ACETONIDE(NASAL)) 2  spray each nostril once daily alternating daily with nasonex  #1 x 6   Entered by:   Willy Eddy, LPN   Authorized by:   Stacie Glaze MD   Signed by:   Willy Eddy, LPN on 62/83/1517   Method used:   Electronically to        [facility] [phone]* (retail)       [address]       [place], [place]  [address]       Ph: [phone] or [phone]       Fax: [phone]   RxID:   [id number] CLINDAGEL 1 %  GEL (CLINDAMYCIN PHOSPHATE) apply to face daily  #75 Gram x 5   Entered by:   Willy Eddy, LPN   Authorized by:   Stacie Glaze MD   Signed by:   Willy Eddy, LPN on 27/78/2423   Method used:   Electronically to        [facility] [phone]* (retail)       [address]       [place], [place]  [address]       Ph: [phone] or [phone]       Fax: [phone]   RxID:   [id number] LOPROX 1 %  SHAM (CICLOPIROX) apply to scalp as directed  #1 bottle x 11   Entered by:   Willy Eddy, LPN   Authorized by:   Stacie Glaze MD   Signed by:   Willy Eddy, LPN on 40/97/3532   Method used:   Electronically to        [facility] [phone]* (retail)       [address]       [place], [place]  [address]       Ph: [phone] or [phone]       Fax: [phone]   RxID:   [id number] REMERON 15 MG  TABS (MIRTAZAPINE) once daily  #30 Tablet x 5   Entered by:   Willy Eddy, LPN   Authorized by:   Stacie Glaze MD   Signed by:   Willy Eddy, LPN on 77/41/2878  Method used:   Electronically to        [facility] [phone]* (retail)       [address] [place], [place]  [address]       Ph: [phone] or [phone]       Fax: [phone]   RxID:   [id number]    Immunization History:  Influenza Immunization History:    Influenza:  historical (01/20/2009)

## 2011-04-24 ENCOUNTER — Other Ambulatory Visit: Payer: Self-pay | Admitting: *Deleted

## 2011-04-24 MED ORDER — MIRTAZAPINE 15 MG PO TABS: 15.0000 mg | ORAL_TABLET | Freq: Every day | ORAL | Status: DC

## 2011-04-24 NOTE — Telephone Encounter (Signed)
[facility] [address] [place], last filled 03/11/11.

## 2011-05-13 ENCOUNTER — Telehealth: Payer: Self-pay | Admitting: Internal Medicine

## 2011-05-13 NOTE — Telephone Encounter (Signed)
[phone] PT CALLED WANTED DR Porfirio Oar HIM ASAP  THIS IS PERSONAL

## 2011-05-31 ENCOUNTER — Other Ambulatory Visit (INDEPENDENT_AMBULATORY_CARE_PROVIDER_SITE_OTHER): Payer: BC Managed Care – PPO

## 2011-05-31 DIAGNOSIS — Z0000 Encounter for general adult medical examination without abnormal findings: Secondary | ICD-10-CM

## 2011-05-31 LAB — BASIC METABOLIC PANEL
BUN: 14 mg/dL (ref 6–23)
CO2: 32 mEq/L (ref 19–32)
Calcium: 9.3 mg/dL (ref 8.4–10.5)
Chloride: 104 mEq/L (ref 96–112)
Creatinine, Ser: 0.9 mg/dL (ref 0.4–1.5)
GFR: 97.4 mL/min (ref >60.00)
Glucose, Bld: 94 mg/dL (ref 70–99)
Potassium: 4.9 mEq/L (ref 3.5–5.1)
Sodium: 142 mEq/L (ref 135–145)

## 2011-05-31 LAB — CBC WITH DIFFERENTIAL/PLATELET
Basophils Absolute: 0 10*3/uL (ref 0.0–0.1)
Basophils Relative: 0.8 % (ref 0.0–3.0)
Eosinophils Absolute: 0.1 10*3/uL (ref 0.0–0.7)
Eosinophils Relative: 2.2 % (ref 0.0–5.0)
HCT: 44.1 % (ref 39.0–52.0)
Hemoglobin: 15.1 g/dL (ref 13.0–17.0)
Lymphocytes Relative: 30.5 % (ref 12.0–46.0)
Lymphs Abs: 1.9 10*3/uL (ref 0.7–4.0)
MCHC: 34.2 g/dL (ref 30.0–36.0)
MCV: 90 fl (ref 78.0–100.0)
Monocytes Absolute: 0.6 10*3/uL (ref 0.1–1.0)
Monocytes Relative: 9.6 % (ref 3.0–12.0)
Neutro Abs: 3.5 10*3/uL (ref 1.4–7.7)
Neutrophils Relative %: 56.9 % (ref 43.0–77.0)
Platelets: 186 10*3/uL (ref 150.0–400.0)
RBC: 4.9 Mil/uL (ref 4.22–5.81)
RDW: 13.4 % (ref 11.5–14.6)
WBC: 6.1 10*3/uL (ref 4.5–10.5)

## 2011-05-31 LAB — POCT URINALYSIS DIPSTICK
Bilirubin, UA: NEGATIVE
Glucose, UA: NEGATIVE
Ketones, UA: NEGATIVE
Leukocytes, UA: NEGATIVE
Nitrite, UA: NEGATIVE
Protein, UA: NEGATIVE
Spec Grav, UA: 1.01
Urobilinogen, UA: 0.2
pH, UA: 5.5

## 2011-05-31 LAB — LIPID PANEL
Cholesterol: 195 mg/dL (ref 0–200)
HDL: 49.2 mg/dL (ref >39.00)
LDL Cholesterol: 128 mg/dL — ABNORMAL HIGH (ref 0–99)
Total CHOL/HDL Ratio: 4
Triglycerides: 88 mg/dL (ref 0.0–149.0)
VLDL: 17.6 mg/dL (ref 0.0–40.0)

## 2011-05-31 LAB — HEPATIC FUNCTION PANEL
ALT: 37 U/L (ref 0–53)
AST: 25 U/L (ref 0–37)
Albumin: 3.9 g/dL (ref 3.5–5.2)
Alkaline Phosphatase: 60 U/L (ref 39–117)
Bilirubin, Direct: 0.1 mg/dL (ref 0.0–0.3)
Total Bilirubin: 0.8 mg/dL (ref 0.3–1.2)
Total Protein: 6.3 g/dL (ref 6.0–8.3)

## 2011-05-31 LAB — TSH: TSH: 1.24 u[IU]/mL (ref 0.35–5.50)

## 2011-05-31 LAB — TESTOSTERONE: Testosterone: 489.65 ng/dL (ref 350.00–890.00)

## 2011-05-31 LAB — PSA: PSA: 3.16 ng/mL (ref 0.10–4.00)

## 2011-06-07 ENCOUNTER — Ambulatory Visit: Payer: Self-pay | Admitting: General Surgery

## 2011-06-10 ENCOUNTER — Ambulatory Visit (INDEPENDENT_AMBULATORY_CARE_PROVIDER_SITE_OTHER): Payer: BC Managed Care – PPO | Admitting: Internal Medicine

## 2011-06-10 ENCOUNTER — Encounter: Payer: Self-pay | Admitting: Internal Medicine

## 2011-06-10 VITALS — BP 120/80 | HR 60 | Temp 98.2°F | Resp 16 | Ht 67.0 in | Wt 158.0 lb

## 2011-06-10 DIAGNOSIS — N411 Chronic prostatitis: Secondary | ICD-10-CM

## 2011-06-10 DIAGNOSIS — Z0000 Encounter for general adult medical examination without abnormal findings: Secondary | ICD-10-CM

## 2011-06-10 LAB — PATHOLOGY REPORT

## 2011-06-10 MED ORDER — HYDROCORTISONE ACE-PRAMOXINE 2.5-1 % EX CREA: 5.0000 mL | TOPICAL_CREAM | Freq: Two times a day (BID) | CUTANEOUS | Status: DC | PRN

## 2011-06-10 MED ORDER — FLUTICASONE PROPIONATE 50 MCG/ACT NA SUSP: 2.0000 | Freq: Every day | NASAL | Status: DC

## 2011-06-10 MED ORDER — CICLOPIROX 1 % EX SHAM: 10.0000 mL | MEDICATED_SHAMPOO | Freq: Every day | CUTANEOUS | Status: DC

## 2011-06-10 MED ORDER — CIPROFLOXACIN HCL 500 MG PO TABS: 500.0000 mg | ORAL_TABLET | Freq: Two times a day (BID) | ORAL | Status: AC

## 2011-06-10 MED ORDER — CLINDAMYCIN PHOSPHATE 1 % EX GEL: Freq: Every day | CUTANEOUS | Status: DC

## 2011-06-10 NOTE — Patient Instructions (Signed)
The patient is instructed to continue all medications as prescribed. Schedule followup with check out clerk upon leaving the clinic  

## 2011-06-10 NOTE — Progress Notes (Signed)
Subjective:    Patient ID: Tony Cox, male    DOB: Jun 23, 1950, 61 y.o.   MRN: [id number]  HPI  Patient presents for complete physical examination.  He has a past medical history is significant for hyperlipidemia hypertension and a history of mitral valve prolapse.  He does not have a loud mitral valve murmur but he does have a click in the mid systolic range.  He has recently gained weight due to increased snacking. He had a colonoscopy for colon cancer screening and polyps were detected the pathology of the polyps is pending but he does have a family history of colon cancer and would require 3-5 year colon cancer surveillance  The patient does have an acute complaint of frequency and hesitancy as well as decreased urine stream   Review of Systems  Constitutional: Negative for fever and fatigue.  HENT: Negative for hearing loss, congestion, neck pain and postnasal drip.   Eyes: Negative for discharge, redness and visual disturbance.  Respiratory: Negative for cough, shortness of breath and wheezing.   Cardiovascular: Negative for leg swelling.  Gastrointestinal: Negative for abdominal pain, constipation and abdominal distention.  Genitourinary: Negative for urgency and frequency.  Musculoskeletal: Negative for joint swelling and arthralgias.  Skin: Negative for color change and rash.  Neurological: Negative for weakness and light-headedness.  Hematological: Negative for adenopathy.  Psychiatric/Behavioral: Negative for behavioral problems.   Past Medical History  Diagnosis Date  . Hyperlipidemia   . Hypertension   . Endocarditis, valve unspecified, unspecified cause   . Chordae tendinae rupture     History   Social History  . Marital Status: Single    Spouse Name: N/A    Number of Children: N/A  . Years of Education: N/A   Occupational History  . Not on file.   Social History Main Topics  . Smoking status: Never Smoker   . Smokeless tobacco: Not on file  .  Alcohol Use: Yes  . Drug Use: No  . Sexually Active: Not on file   Other Topics Concern  . Not on file   Social History Narrative  . No narrative on file    Past Surgical History  Procedure Date  . Valvuloplasty with replacement of cosgrove ring,1 congenital ruptur of cordi   . Appendectomy     Family History  Problem Relation Age of Onset  . Adopted: Yes  . Colon cancer    . Cancer      No Known Allergies  No current outpatient prescriptions on file prior to visit.    BP 120/80  Pulse 60  Temp 98.2 F (36.8 C)  Resp 16  Ht 5\' 7"  (1.702 m)  Wt 158 lb (71.668 kg)  BMI 24.75 kg/m2       Objective:   Physical Exam  Nursing note and vitals reviewed. Constitutional: He is oriented to person, place, and time. He appears well-developed and well-nourished.  HENT:  Head: Normocephalic and atraumatic.  Eyes: Conjunctivae are normal. Pupils are equal, round, and reactive to light.  Neck: Normal range of motion. Neck supple.  Cardiovascular: Normal rate and regular rhythm.   Pulmonary/Chest: Effort normal and breath sounds normal.  Abdominal: Soft. Bowel sounds are normal.  Genitourinary:       Enlarged boggy prostate with normal architecture and no masses detected  Musculoskeletal: Normal range of motion.  Neurological: He is alert and oriented to person, place, and time.  Skin: Skin is warm and dry.  Psychiatric: He has a normal mood and  affect. His behavior is normal.          Assessment & Plan:   Patient presents for yearly preventative medicine examination.   all immunizations and health maintenance protocols were reviewed with the patient and they are up to date with these protocols.   screening laboratory values were reviewed with the patient including screening of hyperlipidemia PSA renal function and hepatic function.   There medications past medical history social history problem list and allergies were reviewed in detail.   Goals were  established with regard to weight loss exercise diet in compliance with medications   Goals for weight loss were established with the patient as well as goals for exercise discussion of colon cancer screening frequency was inserted.  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

## 2011-09-23 ENCOUNTER — Other Ambulatory Visit: Payer: BC Managed Care – PPO

## 2011-09-30 ENCOUNTER — Ambulatory Visit: Payer: BC Managed Care – PPO | Admitting: Internal Medicine

## 2011-12-12 ENCOUNTER — Ambulatory Visit: Payer: BC Managed Care – PPO | Admitting: Internal Medicine

## 2011-12-16 ENCOUNTER — Other Ambulatory Visit: Payer: Self-pay | Admitting: Internal Medicine

## 2011-12-16 MED ORDER — MIRTAZAPINE 15 MG PO TABS: 15.0000 mg | ORAL_TABLET | Freq: Every day | ORAL | Status: DC

## 2012-01-23 ENCOUNTER — Telehealth: Payer: Self-pay | Admitting: Internal Medicine

## 2012-04-22 HISTORY — PX: OTHER SURGICAL HISTORY: SHX169

## 2012-06-30 ENCOUNTER — Other Ambulatory Visit: Payer: Self-pay | Admitting: *Deleted

## 2012-06-30 MED ORDER — HYDROCORTISONE ACE-PRAMOXINE 2.5-1 % EX CREA: 5.0000 mL | TOPICAL_CREAM | Freq: Two times a day (BID) | CUTANEOUS | Status: DC | PRN

## 2012-07-06 ENCOUNTER — Ambulatory Visit: Payer: Self-pay

## 2012-07-07 ENCOUNTER — Other Ambulatory Visit (INDEPENDENT_AMBULATORY_CARE_PROVIDER_SITE_OTHER): Payer: BC Managed Care – PPO | Admitting: *Deleted

## 2012-07-07 ENCOUNTER — Other Ambulatory Visit: Payer: Self-pay | Admitting: Internal Medicine

## 2012-07-07 DIAGNOSIS — R319 Hematuria, unspecified: Secondary | ICD-10-CM

## 2012-07-07 LAB — POCT URINALYSIS DIPSTICK
Bilirubin, UA: NEGATIVE
Glucose, UA: NEGATIVE
Ketones, UA: NEGATIVE
Leukocytes, UA: NEGATIVE
Nitrite, UA: NEGATIVE
Protein, UA: NEGATIVE
Spec Grav, UA: <=1.005
Urobilinogen, UA: 0.2
pH, UA: 5.5

## 2012-07-08 ENCOUNTER — Other Ambulatory Visit: Payer: Self-pay | Admitting: *Deleted

## 2012-07-08 ENCOUNTER — Other Ambulatory Visit (INDEPENDENT_AMBULATORY_CARE_PROVIDER_SITE_OTHER): Payer: BC Managed Care – PPO | Admitting: *Deleted

## 2012-07-08 DIAGNOSIS — R319 Hematuria, unspecified: Secondary | ICD-10-CM

## 2012-07-08 LAB — URINALYSIS, ROUTINE W REFLEX MICROSCOPIC
Bilirubin Urine: NEGATIVE
Ketones, ur: NEGATIVE
Leukocytes, UA: NEGATIVE
Nitrite: NEGATIVE
Specific Gravity, Urine: 1.01 (ref 1.000–1.030)
Total Protein, Urine: NEGATIVE
Urine Glucose: NEGATIVE
Urobilinogen, UA: 0.2 (ref 0.0–1.0)
pH: 5.5 (ref 5.0–8.0)

## 2012-07-08 LAB — MICROALBUMIN / CREATININE URINE RATIO
Creatinine,U: 39.5 mg/dL
Microalb Creat Ratio: 1.8 mg/g (ref 0.0–30.0)
Microalb, Ur: 0.7 mg/dL (ref 0.0–1.9)

## 2012-07-08 LAB — URINE CULTURE
Colony Count: NO GROWTH
Organism ID, Bacteria: NO GROWTH

## 2012-07-09 ENCOUNTER — Telehealth: Payer: Self-pay | Admitting: Internal Medicine

## 2012-07-09 ENCOUNTER — Ambulatory Visit: Payer: Self-pay

## 2012-07-09 NOTE — Telephone Encounter (Signed)
Pt came checking his urine sample results Please advise

## 2012-07-10 NOTE — Telephone Encounter (Signed)
Pt.notified

## 2012-07-14 ENCOUNTER — Other Ambulatory Visit: Payer: Self-pay | Admitting: Urology

## 2012-07-15 ENCOUNTER — Encounter (HOSPITAL_BASED_OUTPATIENT_CLINIC_OR_DEPARTMENT_OTHER): Payer: Self-pay | Admitting: *Deleted

## 2012-07-15 NOTE — Progress Notes (Addendum)
NPO AFTER MN. ARRIVES AT 0615. NEEDS ISTAT. CURRENT EKG, LOV NOTE, ECHO, AND STRESS TEST TO BE FAXED FROM DR [facility] (# [phone]). WILL TAKE PRILOSEC AM OF SURG W/ SIP OF WATER. REVIEWED [facility] GUIDELINES, WILL BRING MEDS.  SPOKE W/ PT TODAY. TIME AND DATE CHANGE OF PROCEDURE. PT TO ARRIVE AT 0730. SAME INSTRUCTIONS AS ABOVE. LAST EKG RECEIVED IS OUT OF DATE. WILL NEED EKG ON ARRIVAL.

## 2012-07-20 ENCOUNTER — Encounter (HOSPITAL_BASED_OUTPATIENT_CLINIC_OR_DEPARTMENT_OTHER): Payer: Self-pay | Admitting: *Deleted

## 2012-07-27 ENCOUNTER — Ambulatory Visit (HOSPITAL_BASED_OUTPATIENT_CLINIC_OR_DEPARTMENT_OTHER): Payer: BC Managed Care – PPO | Admitting: Anesthesiology

## 2012-07-27 ENCOUNTER — Encounter (HOSPITAL_BASED_OUTPATIENT_CLINIC_OR_DEPARTMENT_OTHER)
Admission: RE | Disposition: A | Discharge status: Home / Self Care | Payer: Self-pay | Source: Ambulatory Visit | Attending: Urology

## 2012-07-27 ENCOUNTER — Ambulatory Visit (HOSPITAL_BASED_OUTPATIENT_CLINIC_OR_DEPARTMENT_OTHER)
Admission: RE | Admit: 2012-07-27 | Discharge: 2012-07-27 | Disposition: A | Discharge status: Home / Self Care | Payer: BC Managed Care – PPO | Source: Ambulatory Visit | Attending: Urology | Admitting: Urology

## 2012-07-27 ENCOUNTER — Encounter (HOSPITAL_BASED_OUTPATIENT_CLINIC_OR_DEPARTMENT_OTHER): Payer: Self-pay | Admitting: Anesthesiology

## 2012-07-27 ENCOUNTER — Encounter (HOSPITAL_BASED_OUTPATIENT_CLINIC_OR_DEPARTMENT_OTHER): Payer: Self-pay | Admitting: *Deleted

## 2012-07-27 DIAGNOSIS — N329 Bladder disorder, unspecified: Secondary | ICD-10-CM | POA: Insufficient documentation

## 2012-07-27 DIAGNOSIS — R319 Hematuria, unspecified: Secondary | ICD-10-CM

## 2012-07-27 DIAGNOSIS — I10 Essential (primary) hypertension: Secondary | ICD-10-CM | POA: Insufficient documentation

## 2012-07-27 DIAGNOSIS — C679 Malignant neoplasm of bladder, unspecified: Secondary | ICD-10-CM | POA: Insufficient documentation

## 2012-07-27 DIAGNOSIS — R310 Gross hematuria: Secondary | ICD-10-CM | POA: Insufficient documentation

## 2012-07-27 DIAGNOSIS — K219 Gastro-esophageal reflux disease without esophagitis: Secondary | ICD-10-CM | POA: Insufficient documentation

## 2012-07-27 DIAGNOSIS — E785 Hyperlipidemia, unspecified: Secondary | ICD-10-CM | POA: Insufficient documentation

## 2012-07-27 DIAGNOSIS — Z954 Presence of other heart-valve replacement: Secondary | ICD-10-CM | POA: Insufficient documentation

## 2012-07-27 HISTORY — DX: Other specified postprocedural states: Z98.890

## 2012-07-27 HISTORY — DX: Personal history of other diseases of the circulatory system: Z86.79

## 2012-07-27 HISTORY — DX: Depression, unspecified: F32.A

## 2012-07-27 HISTORY — DX: Cardiac murmur, unspecified: R01.1

## 2012-07-27 HISTORY — DX: Major depressive disorder, single episode, unspecified: F32.9

## 2012-07-27 HISTORY — DX: Gastro-esophageal reflux disease without esophagitis: K21.9

## 2012-07-27 HISTORY — PX: TRANSURETHRAL RESECTION OF BLADDER TUMOR: SHX2575

## 2012-07-27 LAB — POCT I-STAT 4, (NA,K, GLUC, HGB,HCT)
Glucose, Bld: 107 mg/dL — ABNORMAL HIGH (ref 70–99)
HCT: 46 % (ref 39.0–52.0)
Hemoglobin: 15.6 g/dL (ref 13.0–17.0)
Potassium: 4.1 mEq/L (ref 3.5–5.1)
Sodium: 141 mEq/L (ref 135–145)

## 2012-07-27 SURGERY — TURBT (TRANSURETHRAL RESECTION OF BLADDER TUMOR)
Anesthesia: General | Site: Bladder | Wound class: Clean Contaminated

## 2012-07-27 MED ORDER — LACTATED RINGERS IV SOLN
INTRAVENOUS | Status: DC
  Filled 2012-07-27: qty 1000

## 2012-07-27 MED ORDER — PROPOFOL 10 MG/ML IV BOLUS
INTRAVENOUS | Status: DC | PRN
Start: 2012-07-27 — End: 2012-07-27
  Administered 2012-07-27: 200 mg via INTRAVENOUS

## 2012-07-27 MED ORDER — BELLADONNA ALKALOIDS-OPIUM 16.2-60 MG RE SUPP
RECTAL | Status: DC | PRN
  Administered 2012-07-27: 1 via RECTAL

## 2012-07-27 MED ORDER — LACTATED RINGERS IV SOLN
INTRAVENOUS | Status: DC
  Administered 2012-07-27: 08:00:00 via INTRAVENOUS
  Filled 2012-07-27: qty 1000

## 2012-07-27 MED ORDER — PROMETHAZINE HCL 25 MG/ML IJ SOLN
6.2500 mg | INTRAMUSCULAR | Status: DC | PRN
  Filled 2012-07-27: qty 1

## 2012-07-27 MED ORDER — MIRTAZAPINE 15 MG PO TABS
15.0000 mg | ORAL_TABLET | Freq: Every day | ORAL | Status: DC
  Filled 2012-07-27: qty 1

## 2012-07-27 MED ORDER — BELLADONNA ALKALOIDS-OPIUM 16.2-60 MG RE SUPP
1.0000 | Freq: Four times a day (QID) | RECTAL | Status: DC | PRN
Start: 2012-07-27 — End: 2012-07-27
  Administered 2012-07-27: 1 via RECTAL
  Filled 2012-07-27: qty 1

## 2012-07-27 MED ORDER — MIDAZOLAM HCL 5 MG/5ML IJ SOLN
INTRAMUSCULAR | Status: DC | PRN
  Administered 2012-07-27 (×2): 1 mg via INTRAVENOUS
  Administered 2012-07-27: 2 mg via INTRAVENOUS

## 2012-07-27 MED ORDER — MEPERIDINE HCL 25 MG/ML IJ SOLN
6.2500 mg | INTRAMUSCULAR | Status: DC | PRN
  Filled 2012-07-27: qty 1

## 2012-07-27 MED ORDER — CARVEDILOL PHOSPHATE ER 10 MG PO CP24
10.0000 mg | ORAL_CAPSULE | Freq: Every evening | ORAL | Status: DC
  Filled 2012-07-27 (×2): qty 1

## 2012-07-27 MED ORDER — MITOMYCIN CHEMO FOR BLADDER INSTILLATION 40 MG
40.0000 mg | Freq: Once | INTRAVENOUS | Status: AC
  Administered 2012-07-27: 40 mg via INTRAVESICAL
  Filled 2012-07-27: qty 40

## 2012-07-27 MED ORDER — DEXAMETHASONE SODIUM PHOSPHATE 4 MG/ML IJ SOLN
INTRAMUSCULAR | Status: DC | PRN
  Administered 2012-07-27: 10 mg via INTRAVENOUS

## 2012-07-27 MED ORDER — KETOROLAC TROMETHAMINE 30 MG/ML IJ SOLN
INTRAMUSCULAR | Status: DC | PRN
  Administered 2012-07-27: 30 mg via INTRAVENOUS

## 2012-07-27 MED ORDER — SODIUM CHLORIDE 0.9 % IR SOLN
Status: DC | PRN
  Administered 2012-07-27: 6000 mL via INTRAVESICAL

## 2012-07-27 MED ORDER — ONDANSETRON HCL 4 MG/2ML IJ SOLN
INTRAMUSCULAR | Status: DC | PRN
  Administered 2012-07-27: 4 mg via INTRAVENOUS

## 2012-07-27 MED ORDER — TRAMADOL HCL 50 MG PO TABS
50.0000 mg | ORAL_TABLET | Freq: Four times a day (QID) | ORAL | Status: DC
  Administered 2012-07-27 (×2): 50 mg via ORAL
  Filled 2012-07-27: qty 1

## 2012-07-27 MED ORDER — FENTANYL CITRATE 0.05 MG/ML IJ SOLN
25.0000 ug | INTRAMUSCULAR | Status: DC | PRN
  Administered 2012-07-27 (×2): 25 ug via INTRAVENOUS
  Filled 2012-07-27: qty 1

## 2012-07-27 MED ORDER — CHLORPHENIRAMINE-PHENYLEPHRINE 4-10 MG PO TABS: 1.0000 | ORAL_TABLET | Freq: Every morning | ORAL | Status: DC

## 2012-07-27 MED ORDER — PANTOPRAZOLE SODIUM 40 MG PO TBEC
40.0000 mg | DELAYED_RELEASE_TABLET | Freq: Every day | ORAL | Status: DC
  Filled 2012-07-27 (×2): qty 1

## 2012-07-27 MED ORDER — FENTANYL CITRATE 0.05 MG/ML IJ SOLN
INTRAMUSCULAR | Status: DC | PRN
  Administered 2012-07-27 (×2): 25 ug via INTRAVENOUS
  Administered 2012-07-27: 50 ug via INTRAVENOUS
  Administered 2012-07-27 (×4): 25 ug via INTRAVENOUS

## 2012-07-27 MED ORDER — LACTATED RINGERS IV SOLN
INTRAVENOUS | Status: DC | PRN
  Administered 2012-07-27 (×2): via INTRAVENOUS

## 2012-07-27 MED ORDER — DIPHENHYDRAMINE HCL 25 MG PO TABS
25.0000 mg | ORAL_TABLET | Freq: Every day | ORAL | Status: DC
  Filled 2012-07-27 (×2): qty 1

## 2012-07-27 MED ORDER — CIPROFLOXACIN HCL 250 MG PO TABS
250.0000 mg | ORAL_TABLET | Freq: Two times a day (BID) | ORAL | Status: AC
Start: 2012-07-27 — End: 2012-07-27
  Administered 2012-07-27: 250 mg via ORAL
  Filled 2012-07-27: qty 1

## 2012-07-27 MED ORDER — ONDANSETRON HCL 4 MG/2ML IJ SOLN
4.0000 mg | INTRAMUSCULAR | Status: DC | PRN
  Filled 2012-07-27: qty 2

## 2012-07-27 MED ORDER — LIDOCAINE HCL (CARDIAC) 20 MG/ML IV SOLN
INTRAVENOUS | Status: DC | PRN
  Administered 2012-07-27: 75 mg via INTRAVENOUS

## 2012-07-27 MED ORDER — CIPROFLOXACIN IN D5W 400 MG/200ML IV SOLN
400.0000 mg | INTRAVENOUS | Status: AC
  Administered 2012-07-27: 400 mg via INTRAVENOUS
  Filled 2012-07-27: qty 200

## 2012-07-27 MED ORDER — SODIUM CHLORIDE 0.45 % IV SOLN
INTRAVENOUS | Status: DC
  Filled 2012-07-27: qty 1000

## 2012-07-27 MED ORDER — MIRTAZAPINE 7.5 MG PO TABS
7.5000 mg | ORAL_TABLET | Freq: Every day | ORAL | Status: DC
  Filled 2012-07-27 (×3): qty 1

## 2012-07-27 SURGICAL SUPPLY — 27 items
BAG DRAIN URO-CYSTO SKYTR STRL (DRAIN) ×2 IMPLANT
BAG URINE DRAINAGE (UROLOGICAL SUPPLIES) ×2 IMPLANT
BAG URINE LEG 19OZ MD ST LTX (BAG) IMPLANT
CANISTER SUCT LVC 12 LTR MEDI- (MISCELLANEOUS) IMPLANT
CATH FOLEY 2WAY SLVR  5CC 20FR (CATHETERS) ×1
CATH FOLEY 2WAY SLVR  5CC 22FR (CATHETERS)
CATH FOLEY 2WAY SLVR 5CC 20FR (CATHETERS) ×1 IMPLANT
CATH FOLEY 2WAY SLVR 5CC 22FR (CATHETERS) IMPLANT
CLOTH BEACON ORANGE TIMEOUT ST (SAFETY) ×2 IMPLANT
DRAPE CAMERA CLOSED 9X96 (DRAPES) ×2 IMPLANT
ELECT BUTTON BIOP 24F 90D PLAS (MISCELLANEOUS) IMPLANT
ELECT LOOP HF 26F 30D .35MM (CUTTING LOOP) IMPLANT
ELECT LOOP MED HF 24F 12D CBL (CLIP) ×2 IMPLANT
ELECT NEEDLE 45D HF 24-28F 12D (CUTTING LOOP) IMPLANT
ELECT REM PT RETURN 9FT ADLT (ELECTROSURGICAL) ×2
ELECTRODE REM PT RTRN 9FT ADLT (ELECTROSURGICAL) ×1 IMPLANT
EVACUATOR MICROVAS BLADDER (UROLOGICAL SUPPLIES) IMPLANT
GLOVE BIO SURGEON STRL SZ8 (GLOVE) ×2 IMPLANT
GOWN STRL REIN XL XLG (GOWN DISPOSABLE) ×2 IMPLANT
GOWN XL W/COTTON TOWEL STD (GOWNS) ×2 IMPLANT
HOLDER FOLEY CATH W/STRAP (MISCELLANEOUS) ×2 IMPLANT
KIT ASPIRATION TUBING (SET/KITS/TRAYS/PACK) IMPLANT
LOOP CUTTING 24FR OLYMPUS (CUTTING LOOP) IMPLANT
PACK CYSTOSCOPY (CUSTOM PROCEDURE TRAY) ×2 IMPLANT
PLUG CATH AND CAP STER (CATHETERS) IMPLANT
SET ASPIRATION TUBING (TUBING) IMPLANT
SYRINGE IRR TOOMEY STRL 70CC (SYRINGE) ×2 IMPLANT

## 2012-07-27 NOTE — H&P (Signed)
Urology History and Physical Exam  CC: bladder cancer  HPI: 62 year old male presents for TURBT as primary management of a recently discovered bladder tumor. He originally presented to my office on 07/13/2012 for second opinion. He had been seen in [place], [place] for gross hematuria, with a CT of the abdomen and pelvis revealing a "filling defect" at the left ureterovesical junction. Evaluation eventually would have included a cystoscopy, which was performed here. 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 No other abnormalities were seen on the CT scan. He presents at this time for TURBT, as well as placement of mitomycin in the bladder post resection.  PMH: Past Medical History  Diagnosis Date  . Hyperlipidemia   . Hypertension   . 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 Heart murmur   . GERD (gastroesophageal reflux disease)   . Depression   . Bladder tumor   . History of atrial fibrillation without current medication     POST OP MITRIAL VALVE REPLACEMENT IN 2004    PSH: Past Surgical History  Procedure Laterality Date  . Valvuloplasty with replacement of cosgrove ring,1 congenital ruptur of cordi  2004   [facility]  . Transthoracic echocardiogram  11-03-2008  DR FATH Nicholes Rough)    LVF NORMAL/ EF 55-60%/ LEFT ATRIAL ENLARGEMENT/  ADEQUATELY FUNCTIONING MITRAL VALVE REPAIR/  MILD MR & TRI/  NO SIGNIFICANT CHANGE FROM PREVIOUS ECHO  . Tonsillectomy  AS CHILD  . Cholecystectomy  2011    Allergies: Allergies  Allergen Reactions  . Codeine Nausea And Vomiting    Medications: No prescriptions prior to admission     Social History: History   Social History  . Marital Status: Single    Spouse Name: N/A    Number of Children: N/A  . Years of Education: N/A   Occupational History  . Not on file.   Social History Main Topics  .  Smoking status: Never Smoker   . Smokeless tobacco: Not on file  . Alcohol Use: Yes  . Drug Use: No  . Sexually Active: Not on file   Other Topics Concern  . Not on file   Social History Narrative  . No narrative on file    Family History: Family History  Problem Relation Age of Onset  . Adopted: Yes  . Colon cancer    . Cancer      Review of Systems: Genitourinary, constitutional, skin, eye, otolaryngeal, hematologic/lymphatic, cardiovascular, pulmonary, endocrine, musculoskeletal, gastrointestinal, neurological and psychiatric system(s) were reviewed and pertinent findings if present are noted.  Genitourinary: nocturia and hematuria.  Musculoskeletal: back pain.   Physical Exam: @VITALS2 @ Constitutional: Well nourished and well developed . No acute distress.  ENT:. The ears and nose are normal in appearance.  Neck: The appearance of the neck is normal and no neck mass is present.  Pulmonary: No respiratory distress and normal respiratory rhythm and effort.  Cardiovascular: Heart rate and rhythm are normal . No peripheral edema.  Abdomen: The abdomen is flat. The abdomen is soft and nontender. No masses are palpated. No CVA tenderness.  A right inguinal hernia is present, which is reducible.  A left inguinal hernia is present, which is reducible. No hepatosplenomegaly noted.  Rectal: Rectal exam demonstrates normal sphincter tone, the anus is normal on inspection., no tenderness and no masses. Estimated prostate size is 2+. Normal rectal  tone, no rectal masses, prostate is smooth, symmetric and non-tender. The prostate has no nodularity and is not tender. The left seminal vesicle is nonpalpable. The right seminal vesicle is nonpalpable. The perineum is normal on inspection.  Genitourinary: Examination of the penis demonstrates no discharge, no masses, no lesions and a normal meatus. The scrotum is normal in appearance and without lesions. Examination of the right scrotum  demonstrates no varicocele. Examination of the left scrotum demostrates a varicocele. The right epididymis is palpably normal and non-tender. The left epididymis is palpably normal and non-tender. The right spermatic cord is palpably normal. The left spermatic cord is palpably normal. The right testis is palpably normal, non-tender and without masses. The left testis is normal, non-tender and without masses.  Lymphatics: The femoral and inguinal nodes are not enlarged or tender.  Skin: Normal skin turgor, no visible rash and no visible skin lesions.  Neuro/Psych:. Mood and affect are appropriate.   Studies:  No results found for this basename: HGB, WBC, PLT,  in the last 72 hours  No results found for this basename: NA, K, CL, CO2, BUN, CREATININE, CALCIUM, MAGNESIUM, GFRNONAA, GFRAA,  in the last 72 hours   No results found for this basename: PT, INR, APTT,  in the last 72 hours   No components found with this basename: ABG,     Assessment:  Urothelial carcinoma of the bladder  Plan: TURBT with placement of mitomycin postoperatively

## 2012-07-27 NOTE — Anesthesia Postprocedure Evaluation (Signed)
  Anesthesia Post-op Note  Patient: Tony Cox  Procedure(s) Performed: Procedure(s) (LRB): TRANSURETHRAL RESECTION OF BLADDER TUMOR (TURBT) (N/A)  Patient Location: PACU  Anesthesia Type: General  Level of Consciousness: awake and alert   Airway and Oxygen Therapy: Patient Spontanous Breathing  Post-op Pain: mild  Post-op Assessment: Post-op Vital signs reviewed, Patient's Cardiovascular Status Stable, Respiratory Function Stable, Patent Airway and No signs of Nausea or vomiting  Last Vitals:  Filed Vitals:   07/27/12 1015  BP: 124/85  Pulse: 64  Temp:   Resp: 13    Post-op Vital Signs: stable   Complications: No apparent anesthesia complications

## 2012-07-27 NOTE — Anesthesia Preprocedure Evaluation (Addendum)
Anesthesia Evaluation  Patient identified by MRN, date of birth, ID band Patient awake    Reviewed: Allergy & Precautions, H&P , NPO status , Patient's Chart, lab work & pertinent test results  Airway Mallampati: II TM Distance: >3 FB Neck ROM: Full    Dental no notable dental hx.    Pulmonary neg pulmonary ROS,  breath sounds clear to auscultation  Pulmonary exam normal       Cardiovascular hypertension, Pt. on medications + dysrhythmias Atrial Fibrillation + Valvular Problems/Murmurs (s/p MVR 2004. mild MR by echo) MR Rhythm:Regular Rate:Normal     Neuro/Psych negative neurological ROS  negative psych ROS   GI/Hepatic Neg liver ROS, GERD-  Medicated and Controlled,  Endo/Other  negative endocrine ROS  Renal/GU negative Renal ROS  negative genitourinary   Musculoskeletal negative musculoskeletal ROS (+)   Abdominal   Peds negative pediatric ROS (+)  Hematology negative hematology ROS (+)   Anesthesia Other Findings   Reproductive/Obstetrics negative OB ROS                          Anesthesia Physical Anesthesia Plan  ASA: II  Anesthesia Plan: General   Post-op Pain Management:    Induction: Intravenous  Airway Management Planned: LMA  Additional Equipment:   Intra-op Plan:   Post-operative Plan: Extubation in OR  Informed Consent: I have reviewed the patients History and Physical, chart, labs and discussed the procedure including the risks, benefits and alternatives for the proposed anesthesia with the patient or authorized representative who has indicated his/her understanding and acceptance.   Dental advisory given  Plan Discussed with: CRNA  Anesthesia Plan Comments:         Anesthesia Quick Evaluation

## 2012-07-27 NOTE — Transfer of Care (Signed)
Immediate Anesthesia Transfer of Care Note  Patient: Tony Cox  Procedure(s) Performed: Procedure(s) (LRB): TRANSURETHRAL RESECTION OF BLADDER TUMOR (TURBT) (N/A)  Patient Location: PACU  Anesthesia Type: General  Level of Consciousness: awake, sedated, patient cooperative and responds to stimulation  Airway & Oxygen Therapy: Patient Spontanous Breathing and Patient connected to face mask oxygen  Post-op Assessment: Report given to PACU RN, Post -op Vital signs reviewed and stable and Patient moving all extremities  Post vital signs: Reviewed and stable  Complications: No apparent anesthesia complications

## 2012-07-27 NOTE — Anesthesia Procedure Notes (Signed)
Procedure Name: LMA Insertion Date/Time: 07/27/2012 9:04 AM Performed by: Jessica Priest Pre-anesthesia Checklist: Patient identified, Emergency Drugs available, Suction available and Patient being monitored Patient Re-evaluated:Patient Re-evaluated prior to inductionOxygen Delivery Method: Circle System Utilized Preoxygenation: Pre-oxygenation with 100% oxygen Intubation Type: IV induction Ventilation: Mask ventilation without difficulty LMA: LMA inserted LMA Size: 4.0 Number of attempts: 1 Airway Equipment and Method: bite block Placement Confirmation: positive ETCO2 Tube secured with: Tape Dental Injury: Teeth and Oropharynx as per pre-operative assessment

## 2012-07-27 NOTE — Op Note (Signed)
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 Cache Bills, M.D.   Anesthesia: Gen.   Complications: None  Specimen(s): 1. Posterior bladder biopsy 2. Left bladder wall biopsy 3. TURBT of left trigonal lesion   Drain(s):20 French Foley catheter to bedside bag  Indications:62 year old male who recently presented with gross hematuria, basically negative CT scan except for a filling defect in the bladder in the left trigone area, and cystoscopic evidence of urothelial carcinoma in the left trigonal region. He presents at this time for cystoscopy, TURBT and probable placement of mitomycin. Risks and complications of the procedure have been discussed with the patient. These include but are not limited to infection, bleeding, chemical cystitis from mitomycin, among others. He understands these and desires to proceed.    Technique and findings:The patient was properly identified in the holding area and received preoperative IV ciprofloxacin. He was taken to the operating room where general anesthetic was administered with the LMA. He was placed in the dorsolithotomy position. Genitalia and perineum were prepped and draped. Proper timeout was then performed.  I then placed a 22 French panendoscope through the urethra into the bladder. The urethra was free of any abnormalities. The prostate was nonobstructive. Inspection was carried out both the 12 and 70 lenses. There was a previously noted 18-20 mm bladder tumor in the left trigonal region. There was a small erythematous area in the midline of the posterior bladder wall approximately 4 mm in size. There were some superficial urothelial lesions on the left bladder wall, each approximately 4 mm in size. There were 2 or 3 of these. No bladder stones were seen. There were no significant trabeculations. I then removed the posterior bladder  lesion with the cold cup biopsy forceps. This was sent as "posterior bladder lesion". The left bladder wall lesions were also removed totally using the cold cup biopsy forceps, and labeled "left bladder wall lesions".  I then removed the cystoscope, and using the obturator, passed the resectoscope sheath. The cutting loop was placed. Using the resectoscope, I then removed the trigonal lesion down to the muscular layer with the loop, using the cutting current. Care was taken to stay away from the ureteral orifice on the left, which was inferior to the lesion. Wide resection of the lesion was carried out, such that I felt that I had good margins, and also deep resection was performed, and I felt fairly confident that there was muscle  in the included biopsy.following resection of the trigonal lesion, I then cauterized the base of the lesion with the loop, as well as the previously biopsied sites in the posterior left bladder wall. At this point, hemostasis was excellent. I irrigated the bladder tumor fragments from the bladder with a Toomey syringe, and sent the last specimen as "left trigonal lesion". The scope was removed following assurance of hemostasis, and a 20 French Foley catheter was placed and filled with 10 cc of water in the balloon.  The patient was awakened and taken to the PACU in stable condition. 40 mg of mitomycin were administered intravesically in the PACU.

## 2012-07-28 ENCOUNTER — Telehealth: Payer: Self-pay | Admitting: Internal Medicine

## 2012-07-28 ENCOUNTER — Encounter (HOSPITAL_BASED_OUTPATIENT_CLINIC_OR_DEPARTMENT_OTHER): Payer: Self-pay | Admitting: Urology

## 2012-07-28 NOTE — Telephone Encounter (Signed)
Pt  Called stating he had surgery and wanted to talk to dr Darrick Huntsman

## 2012-07-28 NOTE — Telephone Encounter (Signed)
This patient

## 2012-07-28 NOTE — Telephone Encounter (Signed)
Please apologize, i cannot call patient back today because I am seeing patients and in meetings at lunch and after dinner. I notice that he has not been seen here in  In over a year, but I treated him during his mother's recent visit for signs and symptoms of UTI and it turned out he had a tumor in his bladder that was resected yesterday,. If he is not willing to give you a message, let me know

## 2012-07-29 NOTE — Telephone Encounter (Signed)
Spoke to patient and was advised this was not related to him but his mother Tony Cox. See Phone note on chart.

## 2012-08-19 ENCOUNTER — Ambulatory Visit: Payer: Self-pay | Admitting: Oncology

## 2012-08-20 ENCOUNTER — Ambulatory Visit: Payer: Self-pay | Admitting: Oncology

## 2012-08-26 ENCOUNTER — Encounter: Payer: Self-pay | Admitting: Internal Medicine

## 2012-08-26 DIAGNOSIS — C689 Malignant neoplasm of urinary organ, unspecified: Secondary | ICD-10-CM | POA: Insufficient documentation

## 2012-08-27 LAB — COMPREHENSIVE METABOLIC PANEL
Albumin: 3.8 g/dL (ref 3.4–5.0)
Alkaline Phosphatase: 78 U/L (ref 50–136)
Anion Gap: 5 — ABNORMAL LOW (ref 7–16)
BUN: 12 mg/dL (ref 7–18)
Bilirubin,Total: 0.5 mg/dL (ref 0.2–1.0)
Calcium, Total: 9.4 mg/dL (ref 8.5–10.1)
Chloride: 105 mmol/L (ref 98–107)
Co2: 32 mmol/L (ref 21–32)
Creatinine: 1.13 mg/dL (ref 0.60–1.30)
EGFR (African American): >60
EGFR (Non-African Amer.): >60
Glucose: 92 mg/dL (ref 65–99)
Osmolality: 283 (ref 275–301)
Potassium: 4.3 mmol/L (ref 3.5–5.1)
SGOT(AST): 14 U/L — ABNORMAL LOW (ref 15–37)
SGPT (ALT): 29 U/L (ref 12–78)
Sodium: 142 mmol/L (ref 136–145)
Total Protein: 6.8 g/dL (ref 6.4–8.2)

## 2012-08-27 LAB — CBC CANCER CENTER
Basophil #: 0.1 x10 3/mm (ref 0.0–0.1)
Basophil %: 1.3 %
Eosinophil #: 0.2 x10 3/mm (ref 0.0–0.7)
Eosinophil %: 3.4 %
HCT: 44 % (ref 40.0–52.0)
HGB: 15 g/dL (ref 13.0–18.0)
Lymphocyte #: 1.5 x10 3/mm (ref 1.0–3.6)
Lymphocyte %: 27.7 %
MCH: 30.1 pg (ref 26.0–34.0)
MCHC: 34 g/dL (ref 32.0–36.0)
MCV: 89 fL (ref 80–100)
Monocyte #: 0.6 x10 3/mm (ref 0.2–1.0)
Monocyte %: 11.7 %
Neutrophil #: 3 x10 3/mm (ref 1.4–6.5)
Neutrophil %: 55.9 %
Platelet: 186 x10 3/mm (ref 150–440)
RBC: 4.97 10*6/uL (ref 4.40–5.90)
RDW: 13 % (ref 11.5–14.5)
WBC: 5.3 x10 3/mm (ref 3.8–10.6)

## 2012-08-31 LAB — CBC CANCER CENTER
Basophil #: 0.1 x10 3/mm (ref 0.0–0.1)
Basophil %: 0.8 %
Eosinophil #: 0.2 x10 3/mm (ref 0.0–0.7)
Eosinophil %: 3.7 %
HCT: 43.6 % (ref 40.0–52.0)
HGB: 14.9 g/dL (ref 13.0–18.0)
Lymphocyte #: 1.5 x10 3/mm (ref 1.0–3.6)
Lymphocyte %: 22.7 %
MCH: 30.1 pg (ref 26.0–34.0)
MCHC: 34.2 g/dL (ref 32.0–36.0)
MCV: 88 fL (ref 80–100)
Monocyte #: 0.1 x10 3/mm — ABNORMAL LOW (ref 0.2–1.0)
Monocyte %: 1.3 %
Neutrophil #: 4.8 x10 3/mm (ref 1.4–6.5)
Neutrophil %: 71.5 %
Platelet: 172 x10 3/mm (ref 150–440)
RBC: 4.95 10*6/uL (ref 4.40–5.90)
RDW: 12.7 % (ref 11.5–14.5)
WBC: 6.7 x10 3/mm (ref 3.8–10.6)

## 2012-08-31 LAB — COMPREHENSIVE METABOLIC PANEL
Albumin: 3.9 g/dL (ref 3.4–5.0)
Alkaline Phosphatase: 74 U/L (ref 50–136)
Anion Gap: 7 (ref 7–16)
BUN: 15 mg/dL (ref 7–18)
Bilirubin,Total: 0.8 mg/dL (ref 0.2–1.0)
Calcium, Total: 9.4 mg/dL (ref 8.5–10.1)
Chloride: 96 mmol/L — ABNORMAL LOW (ref 98–107)
Co2: 28 mmol/L (ref 21–32)
Creatinine: 1.09 mg/dL (ref 0.60–1.30)
EGFR (African American): >60
EGFR (Non-African Amer.): >60
Glucose: 96 mg/dL (ref 65–99)
Osmolality: 263 (ref 275–301)
Potassium: 4 mmol/L (ref 3.5–5.1)
SGOT(AST): 33 U/L (ref 15–37)
SGPT (ALT): 76 U/L (ref 12–78)
Sodium: 131 mmol/L — ABNORMAL LOW (ref 136–145)
Total Protein: 6.9 g/dL (ref 6.4–8.2)

## 2012-09-03 LAB — COMPREHENSIVE METABOLIC PANEL
Albumin: 3.9 g/dL (ref 3.4–5.0)
Alkaline Phosphatase: 90 U/L (ref 50–136)
Anion Gap: 9 (ref 7–16)
BUN: 15 mg/dL (ref 7–18)
Bilirubin,Total: 0.4 mg/dL (ref 0.2–1.0)
Calcium, Total: 9 mg/dL (ref 8.5–10.1)
Chloride: 96 mmol/L — ABNORMAL LOW (ref 98–107)
Co2: 30 mmol/L (ref 21–32)
Creatinine: 1.11 mg/dL (ref 0.60–1.30)
EGFR (African American): >60
EGFR (Non-African Amer.): >60
Glucose: 101 mg/dL — ABNORMAL HIGH (ref 65–99)
Osmolality: 271 (ref 275–301)
Potassium: 3.8 mmol/L (ref 3.5–5.1)
SGOT(AST): 21 U/L (ref 15–37)
SGPT (ALT): 56 U/L (ref 12–78)
Sodium: 135 mmol/L — ABNORMAL LOW (ref 136–145)
Total Protein: 7.4 g/dL (ref 6.4–8.2)

## 2012-09-03 LAB — CBC CANCER CENTER
Basophil #: 0.1 x10 3/mm (ref 0.0–0.1)
Basophil %: 1 %
Eosinophil #: 0.1 x10 3/mm (ref 0.0–0.7)
Eosinophil %: 1.4 %
HCT: 44 % (ref 40.0–52.0)
HGB: 15.2 g/dL (ref 13.0–18.0)
Lymphocyte #: 2 x10 3/mm (ref 1.0–3.6)
Lymphocyte %: 35.7 %
MCH: 30.1 pg (ref 26.0–34.0)
MCHC: 34.5 g/dL (ref 32.0–36.0)
MCV: 87 fL (ref 80–100)
Monocyte #: 0.1 x10 3/mm — ABNORMAL LOW (ref 0.2–1.0)
Monocyte %: 2.2 %
Neutrophil #: 3.4 x10 3/mm (ref 1.4–6.5)
Neutrophil %: 59.7 %
Platelet: 140 x10 3/mm — ABNORMAL LOW (ref 150–440)
RBC: 5.04 10*6/uL (ref 4.40–5.90)
RDW: 12.3 % (ref 11.5–14.5)
WBC: 5.7 x10 3/mm (ref 3.8–10.6)

## 2012-09-20 ENCOUNTER — Ambulatory Visit: Payer: Self-pay | Admitting: Oncology

## 2012-09-24 LAB — COMPREHENSIVE METABOLIC PANEL
Albumin: 3.6 g/dL (ref 3.4–5.0)
Alkaline Phosphatase: 99 U/L (ref 50–136)
Anion Gap: 9 (ref 7–16)
BUN: 13 mg/dL (ref 7–18)
Bilirubin,Total: 0.3 mg/dL (ref 0.2–1.0)
Calcium, Total: 9.2 mg/dL (ref 8.5–10.1)
Chloride: 102 mmol/L (ref 98–107)
Co2: 29 mmol/L (ref 21–32)
Creatinine: 1.11 mg/dL (ref 0.60–1.30)
EGFR (African American): >60
EGFR (Non-African Amer.): >60
Glucose: 101 mg/dL — ABNORMAL HIGH (ref 65–99)
Osmolality: 280 (ref 275–301)
Potassium: 4.1 mmol/L (ref 3.5–5.1)
SGOT(AST): 16 U/L (ref 15–37)
SGPT (ALT): 24 U/L (ref 12–78)
Sodium: 140 mmol/L (ref 136–145)
Total Protein: 6.6 g/dL (ref 6.4–8.2)

## 2012-09-24 LAB — CBC CANCER CENTER
Basophil #: 0.3 x10 3/mm — ABNORMAL HIGH (ref 0.0–0.1)
Basophil %: 4 %
Eosinophil #: 0.2 x10 3/mm (ref 0.0–0.7)
Eosinophil %: 2.5 %
HCT: 39.4 % — ABNORMAL LOW (ref 40.0–52.0)
HGB: 13.6 g/dL (ref 13.0–18.0)
Lymphocyte #: 1.8 x10 3/mm (ref 1.0–3.6)
Lymphocyte %: 26.1 %
MCH: 30.7 pg (ref 26.0–34.0)
MCHC: 34.5 g/dL (ref 32.0–36.0)
MCV: 89 fL (ref 80–100)
Monocyte #: 0.7 x10 3/mm (ref 0.2–1.0)
Monocyte %: 9.5 %
Neutrophil #: 4 x10 3/mm (ref 1.4–6.5)
Neutrophil %: 57.9 %
Platelet: 298 x10 3/mm (ref 150–440)
RBC: 4.44 10*6/uL (ref 4.40–5.90)
RDW: 13.1 % (ref 11.5–14.5)
WBC: 7 x10 3/mm (ref 3.8–10.6)

## 2012-10-01 LAB — CBC CANCER CENTER
Basophil #: 0 x10 3/mm (ref 0.0–0.1)
Basophil %: 0.5 %
Eosinophil #: 0.1 x10 3/mm (ref 0.0–0.7)
Eosinophil %: 2.1 %
HCT: 36.5 % — ABNORMAL LOW (ref 40.0–52.0)
HGB: 13 g/dL (ref 13.0–18.0)
Lymphocyte #: 1.9 x10 3/mm (ref 1.0–3.6)
Lymphocyte %: 46.6 %
MCH: 31 pg (ref 26.0–34.0)
MCHC: 35.6 g/dL (ref 32.0–36.0)
MCV: 87 fL (ref 80–100)
Monocyte #: 0.3 x10 3/mm (ref 0.2–1.0)
Monocyte %: 8 %
Neutrophil #: 1.8 x10 3/mm (ref 1.4–6.5)
Neutrophil %: 42.8 %
Platelet: 205 x10 3/mm (ref 150–440)
RBC: 4.2 10*6/uL — ABNORMAL LOW (ref 4.40–5.90)
RDW: 13.5 % (ref 11.5–14.5)
WBC: 4.1 x10 3/mm (ref 3.8–10.6)

## 2012-10-01 LAB — COMPREHENSIVE METABOLIC PANEL
Albumin: 4 g/dL (ref 3.4–5.0)
Alkaline Phosphatase: 96 U/L (ref 50–136)
Anion Gap: 6 — ABNORMAL LOW (ref 7–16)
BUN: 14 mg/dL (ref 7–18)
Bilirubin,Total: 0.3 mg/dL (ref 0.2–1.0)
Calcium, Total: 9 mg/dL (ref 8.5–10.1)
Chloride: 98 mmol/L (ref 98–107)
Co2: 28 mmol/L (ref 21–32)
Creatinine: 1.01 mg/dL (ref 0.60–1.30)
EGFR (African American): >60
EGFR (Non-African Amer.): >60
Glucose: 99 mg/dL (ref 65–99)
Osmolality: 265 (ref 275–301)
Potassium: 3.9 mmol/L (ref 3.5–5.1)
SGOT(AST): 22 U/L (ref 15–37)
SGPT (ALT): 50 U/L (ref 12–78)
Sodium: 132 mmol/L — ABNORMAL LOW (ref 136–145)
Total Protein: 7.2 g/dL (ref 6.4–8.2)

## 2012-10-01 LAB — MAGNESIUM: Magnesium: 2.1 mg/dL

## 2012-10-05 LAB — URINALYSIS, COMPLETE
Bilirubin,UR: NEGATIVE
Glucose,UR: NEGATIVE mg/dL (ref 0–75)
Ketone: NEGATIVE
Nitrite: NEGATIVE
Ph: 7 (ref 4.5–8.0)
Protein: 30
RBC,UR: 4233 /HPF (ref 0–5)
Specific Gravity: 1.009 (ref 1.003–1.030)
Squamous Epithelial: NONE SEEN
WBC UR: 37 /HPF (ref 0–5)

## 2012-10-05 LAB — CBC CANCER CENTER
Bands: 5 %
Eosinophil: 1 %
HCT: 37.1 % — ABNORMAL LOW (ref 40.0–52.0)
HGB: 12.8 g/dL — ABNORMAL LOW (ref 13.0–18.0)
Lymphocytes: 8 %
MCH: 30.9 pg (ref 26.0–34.0)
MCHC: 34.4 g/dL (ref 32.0–36.0)
MCV: 90 fL (ref 80–100)
Platelet: 129 x10 3/mm — ABNORMAL LOW (ref 150–440)
RBC: 4.13 10*6/uL — ABNORMAL LOW (ref 4.40–5.90)
RDW: 13.6 % (ref 11.5–14.5)
Segmented Neutrophils: 86 %
WBC: 25.5 x10 3/mm — ABNORMAL HIGH (ref 3.8–10.6)

## 2012-10-06 LAB — URINE CULTURE

## 2012-10-19 LAB — CBC CANCER CENTER
Basophil #: 0.2 x10 3/mm — ABNORMAL HIGH (ref 0.0–0.1)
Basophil %: 2.3 %
Eosinophil #: 0.2 x10 3/mm (ref 0.0–0.7)
Eosinophil %: 2.8 %
HCT: 35.2 % — ABNORMAL LOW (ref 40.0–52.0)
HGB: 12 g/dL — ABNORMAL LOW (ref 13.0–18.0)
Lymphocyte #: 1.5 x10 3/mm (ref 1.0–3.6)
Lymphocyte %: 20.6 %
MCH: 31.1 pg (ref 26.0–34.0)
MCHC: 34.2 g/dL (ref 32.0–36.0)
MCV: 91 fL (ref 80–100)
Monocyte #: 0.6 x10 3/mm (ref 0.2–1.0)
Monocyte %: 8.9 %
Neutrophil #: 4.7 x10 3/mm (ref 1.4–6.5)
Neutrophil %: 65.4 %
Platelet: 319 x10 3/mm (ref 150–440)
RBC: 3.88 10*6/uL — ABNORMAL LOW (ref 4.40–5.90)
RDW: 15.7 % — ABNORMAL HIGH (ref 11.5–14.5)
WBC: 7.3 x10 3/mm (ref 3.8–10.6)

## 2012-10-19 LAB — COMPREHENSIVE METABOLIC PANEL
Albumin: 3.6 g/dL (ref 3.4–5.0)
Alkaline Phosphatase: 118 U/L (ref 50–136)
Anion Gap: 8 (ref 7–16)
BUN: 12 mg/dL (ref 7–18)
Bilirubin,Total: 0.3 mg/dL (ref 0.2–1.0)
Calcium, Total: 9.1 mg/dL (ref 8.5–10.1)
Chloride: 102 mmol/L (ref 98–107)
Co2: 27 mmol/L (ref 21–32)
Creatinine: 1.34 mg/dL — ABNORMAL HIGH (ref 0.60–1.30)
EGFR (African American): >60
EGFR (Non-African Amer.): 56 — ABNORMAL LOW
Glucose: 97 mg/dL (ref 65–99)
Osmolality: 273 (ref 275–301)
Potassium: 4.3 mmol/L (ref 3.5–5.1)
SGOT(AST): 14 U/L — ABNORMAL LOW (ref 15–37)
SGPT (ALT): 22 U/L (ref 12–78)
Sodium: 137 mmol/L (ref 136–145)
Total Protein: 6.7 g/dL (ref 6.4–8.2)

## 2012-10-20 ENCOUNTER — Ambulatory Visit: Payer: Self-pay | Admitting: Oncology

## 2012-10-20 LAB — PSA: PSA: 2.1 ng/mL (ref 0.0–4.0)

## 2012-10-26 LAB — COMPREHENSIVE METABOLIC PANEL WITH GFR
Albumin: 3.7 g/dL
Alkaline Phosphatase: 104 U/L
Anion Gap: 8
BUN: 12 mg/dL
Bilirubin,Total: 0.5 mg/dL
Calcium, Total: 9.2 mg/dL
Chloride: 98 mmol/L
Co2: 28 mmol/L
Creatinine: 1.09 mg/dL
EGFR (African American): >60
EGFR (Non-African Amer.): >60
Glucose: 111 mg/dL — ABNORMAL HIGH
Osmolality: 269
Potassium: 4.6 mmol/L
SGOT(AST): 24 U/L
SGPT (ALT): 68 U/L
Sodium: 134 mmol/L — ABNORMAL LOW
Total Protein: 7 g/dL

## 2012-10-26 LAB — CBC CANCER CENTER
Basophil #: 0.1 x10 3/mm (ref 0.0–0.1)
Basophil %: 3.7 %
Eosinophil #: 0 x10 3/mm (ref 0.0–0.7)
Eosinophil %: 1.5 %
HCT: 36.7 % — ABNORMAL LOW (ref 40.0–52.0)
HGB: 12.8 g/dL — ABNORMAL LOW (ref 13.0–18.0)
Lymphocyte #: 1.3 x10 3/mm (ref 1.0–3.6)
Lymphocyte %: 43.4 %
MCH: 31.4 pg (ref 26.0–34.0)
MCHC: 35 g/dL (ref 32.0–36.0)
MCV: 90 fL (ref 80–100)
Monocyte #: 0.3 x10 3/mm (ref 0.2–1.0)
Monocyte %: 8.6 %
Neutrophil #: 1.3 x10 3/mm — ABNORMAL LOW (ref 1.4–6.5)
Neutrophil %: 42.8 %
Platelet: 168 x10 3/mm (ref 150–440)
RBC: 4.09 10*6/uL — ABNORMAL LOW (ref 4.40–5.90)
RDW: 15.6 % — ABNORMAL HIGH (ref 11.5–14.5)
WBC: 3 x10 3/mm — ABNORMAL LOW (ref 3.8–10.6)

## 2012-10-28 LAB — COMPREHENSIVE METABOLIC PANEL
Albumin: 3.7 g/dL (ref 3.4–5.0)
Alkaline Phosphatase: 136 U/L (ref 50–136)
Anion Gap: 9 (ref 7–16)
BUN: 16 mg/dL (ref 7–18)
Bilirubin,Total: 0.2 mg/dL (ref 0.2–1.0)
Calcium, Total: 8.8 mg/dL (ref 8.5–10.1)
Chloride: 99 mmol/L (ref 98–107)
Co2: 29 mmol/L (ref 21–32)
Creatinine: 1.05 mg/dL (ref 0.60–1.30)
EGFR (African American): >60
EGFR (Non-African Amer.): >60
Glucose: 107 mg/dL — ABNORMAL HIGH (ref 65–99)
Osmolality: 275 (ref 275–301)
Potassium: 3.5 mmol/L (ref 3.5–5.1)
SGOT(AST): 19 U/L (ref 15–37)
SGPT (ALT): 60 U/L (ref 12–78)
Sodium: 137 mmol/L (ref 136–145)
Total Protein: 6.6 g/dL (ref 6.4–8.2)

## 2012-10-28 LAB — CBC CANCER CENTER
Basophil #: 0.1 x10 3/mm (ref 0.0–0.1)
Basophil %: 0.3 %
Eosinophil #: 0.2 x10 3/mm (ref 0.0–0.7)
Eosinophil %: 0.5 %
HCT: 32.5 % — ABNORMAL LOW (ref 40.0–52.0)
HGB: 11.2 g/dL — ABNORMAL LOW (ref 13.0–18.0)
Lymphocyte #: 3.3 x10 3/mm (ref 1.0–3.6)
Lymphocyte %: 9.4 %
MCH: 30.9 pg (ref 26.0–34.0)
MCHC: 34.6 g/dL (ref 32.0–36.0)
MCV: 89 fL (ref 80–100)
Monocyte #: 1.6 x10 3/mm — ABNORMAL HIGH (ref 0.2–1.0)
Monocyte %: 4.5 %
Neutrophil #: 30.1 x10 3/mm — ABNORMAL HIGH (ref 1.4–6.5)
Neutrophil %: 85.3 %
Platelet: 114 x10 3/mm — ABNORMAL LOW (ref 150–440)
RBC: 3.64 10*6/uL — ABNORMAL LOW (ref 4.40–5.90)
RDW: 15.4 % — ABNORMAL HIGH (ref 11.5–14.5)
WBC: 35.3 x10 3/mm — ABNORMAL HIGH (ref 3.8–10.6)

## 2012-10-31 LAB — CULTURE, BLOOD (SINGLE)

## 2012-11-05 ENCOUNTER — Encounter: Payer: Self-pay | Admitting: *Deleted

## 2012-11-09 ENCOUNTER — Other Ambulatory Visit: Payer: BC Managed Care – PPO

## 2012-11-09 LAB — CBC CANCER CENTER
Basophil #: 0.1 x10 3/mm (ref 0.0–0.1)
Basophil %: 2.1 %
Eosinophil #: 0.2 x10 3/mm (ref 0.0–0.7)
Eosinophil %: 6.4 %
HCT: 37.5 % — ABNORMAL LOW (ref 40.0–52.0)
HGB: 12.5 g/dL — ABNORMAL LOW (ref 13.0–18.0)
Lymphocyte #: 1.3 x10 3/mm (ref 1.0–3.6)
Lymphocyte %: 33.7 %
MCH: 30.6 pg (ref 26.0–34.0)
MCHC: 33.3 g/dL (ref 32.0–36.0)
MCV: 92 fL (ref 80–100)
Monocyte #: 0.3 x10 3/mm (ref 0.2–1.0)
Monocyte %: 7.6 %
Neutrophil #: 1.9 x10 3/mm (ref 1.4–6.5)
Neutrophil %: 50.2 %
Platelet: 260 x10 3/mm (ref 150–440)
RBC: 4.08 10*6/uL — ABNORMAL LOW (ref 4.40–5.90)
RDW: 15.9 % — ABNORMAL HIGH (ref 11.5–14.5)
WBC: 3.9 x10 3/mm (ref 3.8–10.6)

## 2012-11-09 LAB — URINALYSIS, COMPLETE
Bilirubin,UR: NEGATIVE
Glucose,UR: NEGATIVE mg/dL (ref 0–75)
Ketone: NEGATIVE
Nitrite: NEGATIVE
Ph: 5 (ref 4.5–8.0)
Protein: NEGATIVE
RBC,UR: 5 /HPF (ref 0–5)
Specific Gravity: 1.008 (ref 1.003–1.030)
Squamous Epithelial: <1
WBC UR: 42 /HPF (ref 0–5)

## 2012-11-09 LAB — COMPREHENSIVE METABOLIC PANEL
Albumin: 3.6 g/dL (ref 3.4–5.0)
Alkaline Phosphatase: 98 U/L (ref 50–136)
Anion Gap: 5 — ABNORMAL LOW (ref 7–16)
BUN: 14 mg/dL (ref 7–18)
Bilirubin,Total: 0.4 mg/dL (ref 0.2–1.0)
Calcium, Total: 9.3 mg/dL (ref 8.5–10.1)
Chloride: 105 mmol/L (ref 98–107)
Co2: 28 mmol/L (ref 21–32)
Creatinine: 0.91 mg/dL (ref 0.60–1.30)
EGFR (African American): >60
EGFR (Non-African Amer.): >60
Glucose: 123 mg/dL — ABNORMAL HIGH (ref 65–99)
Osmolality: 278 (ref 275–301)
Potassium: 3.8 mmol/L (ref 3.5–5.1)
SGOT(AST): 18 U/L (ref 15–37)
SGPT (ALT): 25 U/L (ref 12–78)
Sodium: 138 mmol/L (ref 136–145)
Total Protein: 6.9 g/dL (ref 6.4–8.2)

## 2012-11-10 LAB — URINE CULTURE

## 2012-11-16 ENCOUNTER — Encounter: Payer: BC Managed Care – PPO | Admitting: Internal Medicine

## 2012-11-16 LAB — URINALYSIS, COMPLETE
Bilirubin,UR: NEGATIVE
Glucose,UR: NEGATIVE mg/dL (ref 0–75)
Ketone: NEGATIVE
Nitrite: NEGATIVE
Ph: 5 (ref 4.5–8.0)
Protein: NEGATIVE
RBC,UR: <1 /HPF (ref 0–5)
Specific Gravity: 1.006 (ref 1.003–1.030)
Squamous Epithelial: NONE SEEN
WBC UR: 5 /HPF (ref 0–5)

## 2012-11-16 LAB — CBC CANCER CENTER
Basophil #: 0 x10 3/mm (ref 0.0–0.1)
Basophil %: 1.4 %
Eosinophil #: 0.1 x10 3/mm (ref 0.0–0.7)
Eosinophil %: 2.1 %
HCT: 36.3 % — ABNORMAL LOW (ref 40.0–52.0)
HGB: 12.4 g/dL — ABNORMAL LOW (ref 13.0–18.0)
Lymphocyte #: 1.5 x10 3/mm (ref 1.0–3.6)
Lymphocyte %: 45.3 %
MCH: 30.8 pg (ref 26.0–34.0)
MCHC: 34 g/dL (ref 32.0–36.0)
MCV: 91 fL (ref 80–100)
Monocyte #: 0.1 x10 3/mm — ABNORMAL LOW (ref 0.2–1.0)
Monocyte %: 3.8 %
Neutrophil #: 1.6 x10 3/mm (ref 1.4–6.5)
Neutrophil %: 47.4 %
Platelet: 137 x10 3/mm — ABNORMAL LOW (ref 150–440)
RBC: 4.01 10*6/uL — ABNORMAL LOW (ref 4.40–5.90)
RDW: 14.7 % — ABNORMAL HIGH (ref 11.5–14.5)
WBC: 3.4 x10 3/mm — ABNORMAL LOW (ref 3.8–10.6)

## 2012-11-18 LAB — URINE CULTURE

## 2012-11-20 ENCOUNTER — Ambulatory Visit: Payer: Self-pay | Admitting: Oncology

## 2012-11-30 LAB — CBC CANCER CENTER
Basophil #: 0.1 x10 3/mm (ref 0.0–0.1)
Basophil %: 0.7 %
Eosinophil #: 0.2 x10 3/mm (ref 0.0–0.7)
Eosinophil %: 1.5 %
HCT: 35.3 % — ABNORMAL LOW (ref 40.0–52.0)
HGB: 11.9 g/dL — ABNORMAL LOW (ref 13.0–18.0)
Lymphocyte #: 2.2 x10 3/mm (ref 1.0–3.6)
Lymphocyte %: 18 %
MCH: 31.2 pg (ref 26.0–34.0)
MCHC: 33.9 g/dL (ref 32.0–36.0)
MCV: 92 fL (ref 80–100)
Monocyte #: 1.4 x10 3/mm — ABNORMAL HIGH (ref 0.2–1.0)
Monocyte %: 11.2 %
Neutrophil #: 8.5 x10 3/mm — ABNORMAL HIGH (ref 1.4–6.5)
Neutrophil %: 68.6 %
Platelet: 193 x10 3/mm (ref 150–440)
RBC: 3.83 10*6/uL — ABNORMAL LOW (ref 4.40–5.90)
RDW: 15.9 % — ABNORMAL HIGH (ref 11.5–14.5)
WBC: 12.4 x10 3/mm — ABNORMAL HIGH (ref 3.8–10.6)

## 2012-11-30 LAB — URINALYSIS, COMPLETE
Bacteria: NONE SEEN
RBC,UR: 1 /HPF (ref 0–5)
Specific Gravity: 1.011 (ref 1.003–1.030)
Squamous Epithelial: NONE SEEN
WBC UR: 14 /HPF (ref 0–5)

## 2012-12-18 ENCOUNTER — Ambulatory Visit: Payer: BC Managed Care – PPO | Admitting: Internal Medicine

## 2012-12-21 ENCOUNTER — Ambulatory Visit: Payer: Self-pay | Admitting: Oncology

## 2013-01-20 ENCOUNTER — Ambulatory Visit: Payer: Self-pay | Admitting: Oncology

## 2013-02-20 ENCOUNTER — Ambulatory Visit: Payer: Self-pay | Admitting: Oncology

## 2013-02-25 ENCOUNTER — Other Ambulatory Visit: Payer: Self-pay

## 2013-04-01 ENCOUNTER — Encounter: Payer: Self-pay | Admitting: General Surgery

## 2013-04-01 ENCOUNTER — Ambulatory Visit (INDEPENDENT_AMBULATORY_CARE_PROVIDER_SITE_OTHER): Payer: BC Managed Care – PPO | Admitting: General Surgery

## 2013-04-01 VITALS — BP 110/64 | HR 72 | Resp 12 | Ht 66.0 in | Wt 142.0 lb

## 2013-04-01 DIAGNOSIS — R198 Other specified symptoms and signs involving the digestive system and abdomen: Secondary | ICD-10-CM | POA: Insufficient documentation

## 2013-04-01 NOTE — Progress Notes (Signed)
Patient ID: Tony Cox, male   DOB: November 19, 1950, 62 y.o.   MRN: [id number]  Chief Complaint  Patient presents with  . Other    talk about prior surgery    HPI Tony Cox is a 62 y.o. male here to talk about modest asymmetry of the left lower abdominal wall status post robot cystectomy/prostatectomy with ileal loop construction completed 9 weeks ago. The patient previously undergone neoadjuvant chemotherapy for a tumor of the distal ureter. He reports that he is doing well, although his strength is slow to return to normal. He is not appreciate any postprandial pain. His stools have been fairly soft. He's been on a fairly rigorous regimen with MiraLax, Dulcolax and senna supplements per the PA from the urology department at [facility].   The patient is aware of some fullness in the right lower abdominal wall, this becomes more prominent as the day goes on. No distinct pain, more and awareness of the area. This is the site where most of his Lovenox injections were given for the first 4 weeks after surgery. HPI  Past Medical History  Diagnosis Date  . Hyperlipidemia   . Hypertension   . 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 Heart murmur   . GERD (gastroesophageal reflux disease)   . Depression   . Bladder tumor   . History of atrial fibrillation without current medication     POST OP MITRIAL VALVE REPLACEMENT IN 2004    Past Surgical History  Procedure Laterality Date  . Valvuloplasty with replacement of cosgrove ring,1 congenital ruptur of cordi  2004   [facility]  . Transthoracic echocardiogram  11-03-2008  DR FATH Nicholes Rough)    LVF NORMAL/ EF 55-60%/ LEFT ATRIAL ENLARGEMENT/  ADEQUATELY FUNCTIONING MITRAL VALVE REPAIR/  MILD MR & TRI/  NO SIGNIFICANT CHANGE FROM PREVIOUS ECHO  . Tonsillectomy  AS CHILD  . Cholecystectomy  2011  . Transurethral resection  of bladder tumor N/A 07/27/2012    Procedure: TRANSURETHRAL RESECTION OF BLADDER TUMOR (TURBT);  Surgeon: Marcine Matar, MD;  Location: [facility];  Service: Urology;  Laterality: N/A;  WITH MITOMYCIN INSTILLATION NEED GYRUS    . Hernia repair  2011    umbilical   . Colonoscopy  2009    Family History  Problem Relation Age of Onset  . Adopted: Yes  . Colon cancer Mother     Social History History  Substance Use Topics  . Smoking status: Never Smoker   . Smokeless tobacco: Never Used  . Alcohol Use: Yes    Allergies  Allergen Reactions  . Codeine Nausea And Vomiting and Hives    Current Outpatient Prescriptions  Medication Sig Dispense Refill  . aspirin EC 81 MG tablet Take 81 mg by mouth daily.      . carvedilol (COREG CR) 10 MG 24 hr capsule Take 10 mg by mouth every evening.      . Chlorpheniramine-Phenylephrine (SUDAFED PE SINUS/ALLERGY) 4-10 MG per tablet Take 1 tablet by mouth every morning.      Marland Kitchen desonide (DESOWEN) 0.05 % lotion       . dexlansoprazole (DEXILANT) 60 MG capsule Take 60 mg by mouth.      . fluticasone (FLONASE) 50 MCG/ACT nasal spray       . mirtazapine (REMERON) 15 MG tablet Take 7.5 mg by mouth at bedtime.      Marland Kitchen  omeprazole (PRILOSEC) 20 MG capsule Take 40 mg by mouth every morning.      . senna (SENOKOT) 8.6 MG TABS tablet Take by mouth.      . Flaxseed, Linseed, (FLAX SEED OIL PO) Take 1 capsule by mouth daily.      . mirtazapine (REMERON) 15 MG tablet Take 1 tablet (15 mg total) by mouth at bedtime.  30 tablet  2   No current facility-administered medications for this visit.    Review of Systems Review of Systems  Constitutional: Negative.   Respiratory: Negative.   Cardiovascular: Negative.     Blood pressure 110/64, pulse 72, resp. rate 12, height 5\' 6"  (1.676 m), weight 142 lb (64.411 kg).  Physical Exam Physical Exam Examination was completed in the standing and supine position. Taking into account the  right-sided ileal conduit there is scant fullness of the left lower rectus. No evidence of midline fascial defect. Examination with legs elevated off the examining table shows no palpable fullness or defect. multiple port sites are appreciated on the left raising the possibility of partial obliteration of the rectus muscle.  Data Reviewed The patient reports a CT scan was completed at [facility] in the last few weeks. Results are not available at this time.  Assessment    No evidence of abdominal wall defect.     Plan    Observation alone is warranted at this time.        Earline Mayotte 04/01/2013, 7:48 PM

## 2013-04-22 DIAGNOSIS — C801 Malignant (primary) neoplasm, unspecified: Secondary | ICD-10-CM

## 2013-04-22 HISTORY — DX: Malignant (primary) neoplasm, unspecified: C80.1

## 2013-07-31 ENCOUNTER — Other Ambulatory Visit: Payer: Self-pay | Admitting: Internal Medicine

## 2013-08-22 IMAGING — CR DG ABDOMEN 3V
1 series · 6 of 6 positions shown · non-contrast
Comparison: none

REASON FOR EXAM: bloating, abd discomfort
COMMENTS:

[Series 1: w chest pa · 0.14mm/px · 6 of 6 slices shown]
[im 1/6]
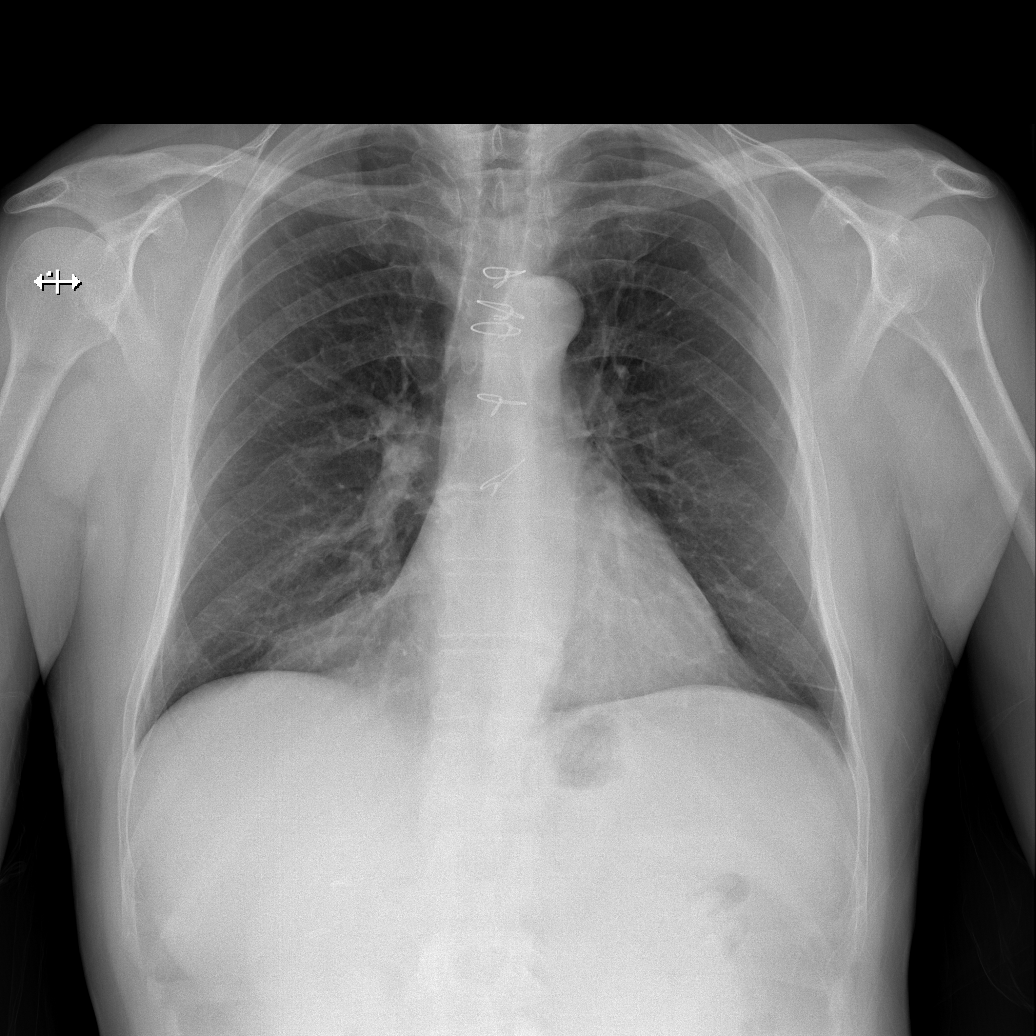
[im 2/6]
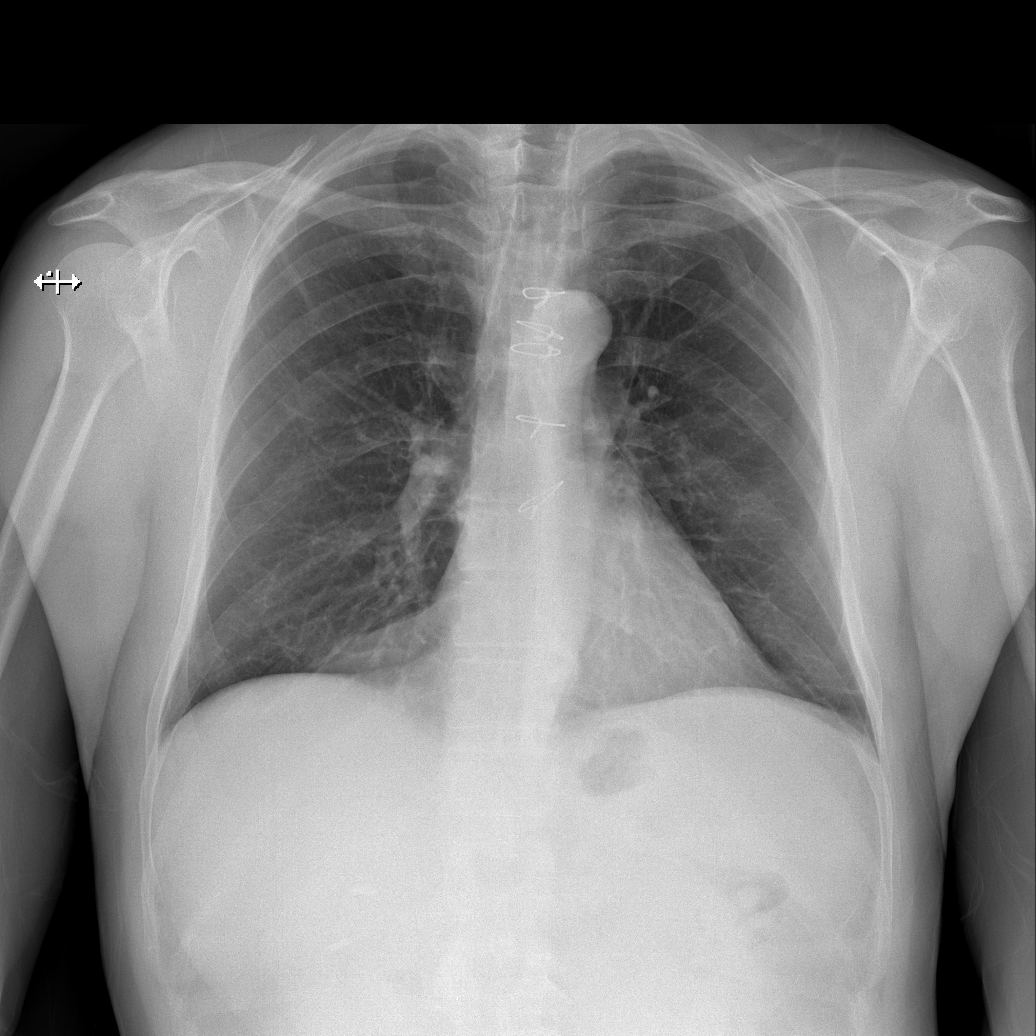
[im 3/6]
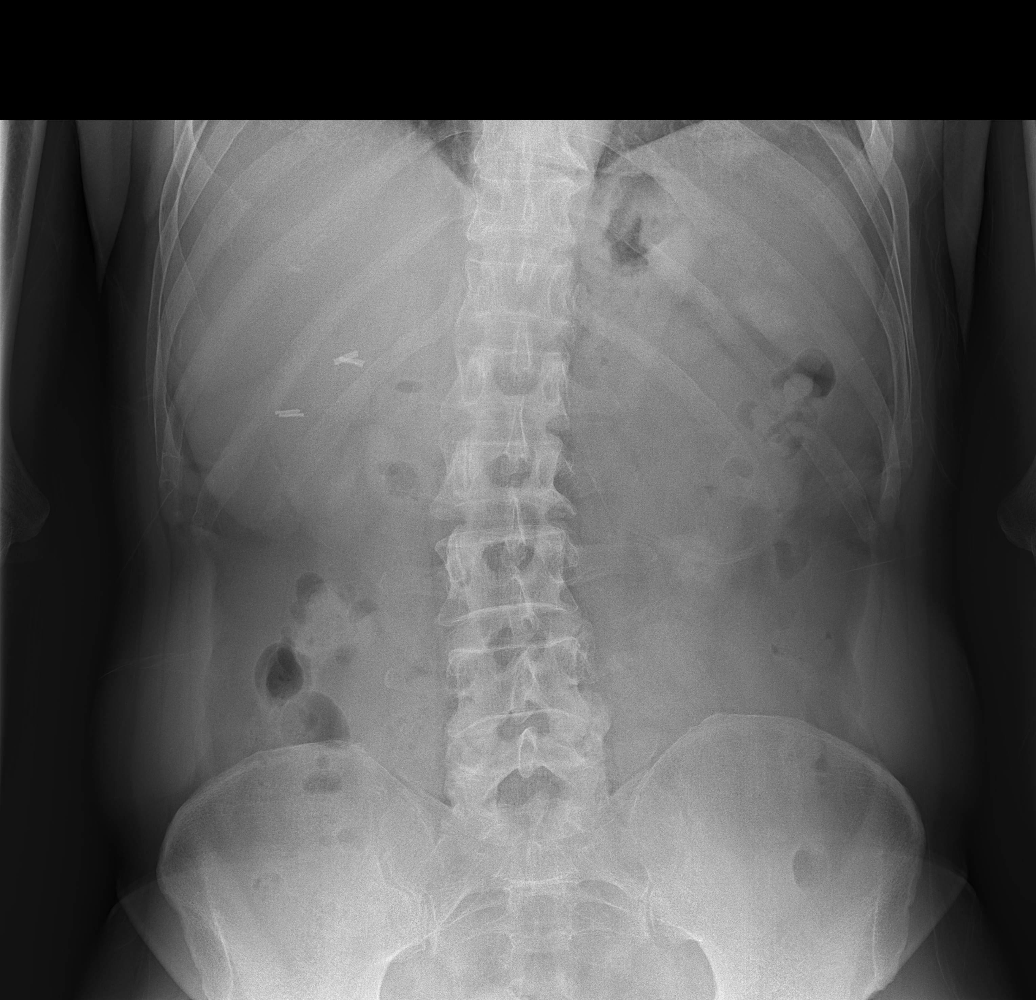
[im 4/6]
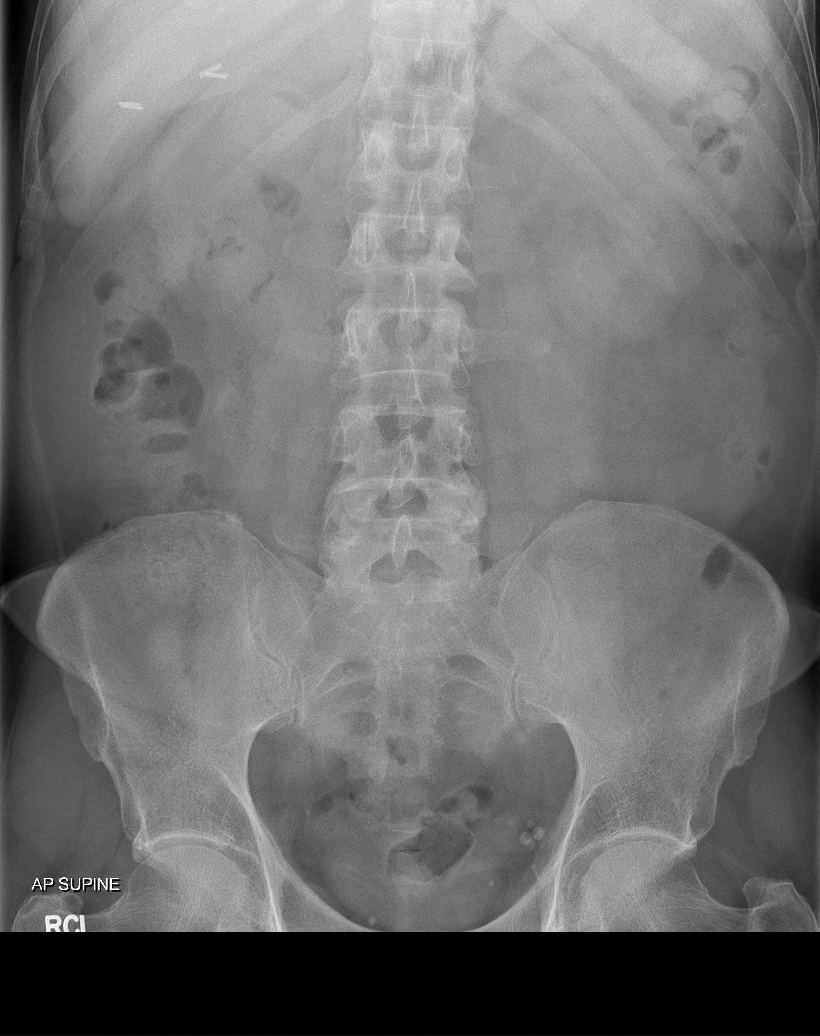
[im 5/6]
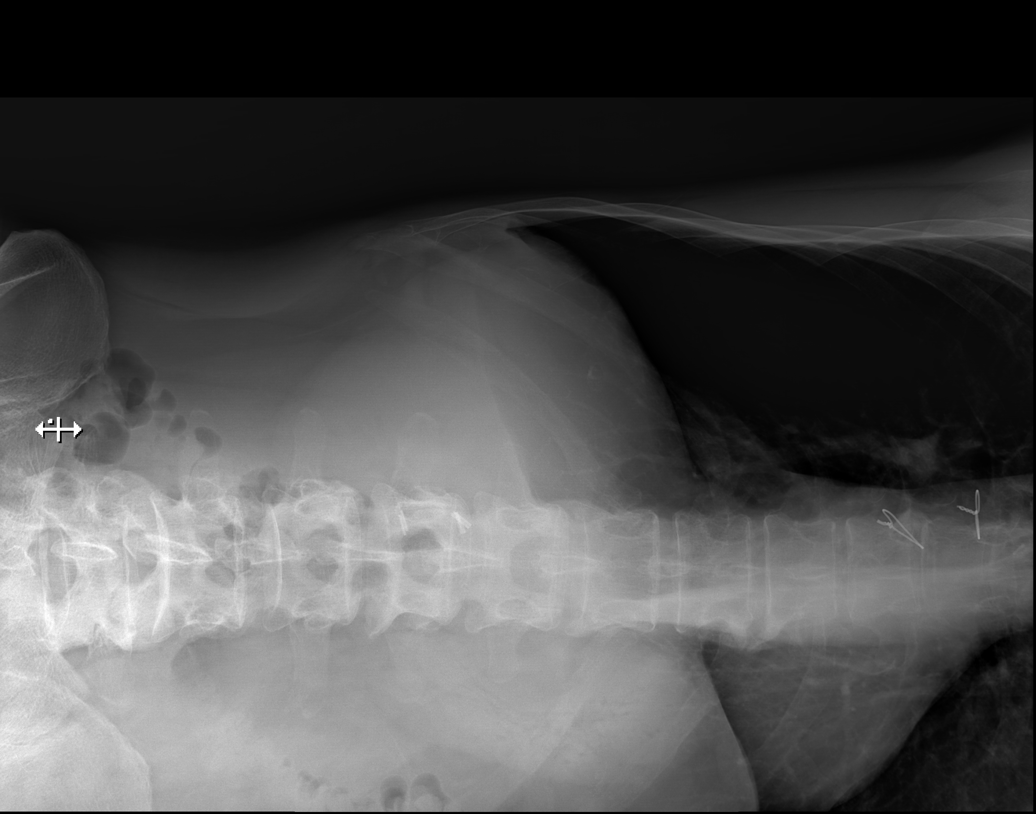
[im 6/6]
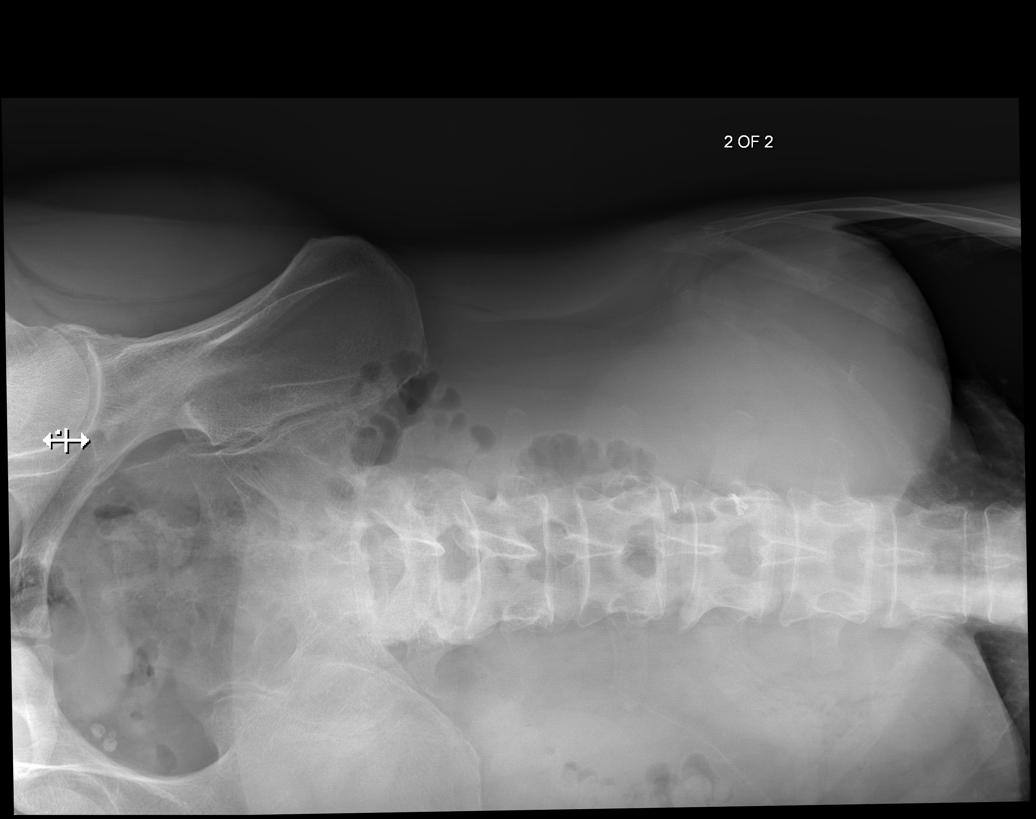

[6 of 6 positions shown; findings below may reference images not displayed]

PROCEDURE:     DXR - DXR ABDOMEN 3-WAY (INCL PA CXR)  - August 31, 2012 [DATE]

RESULT:     The lungs are well-expanded and clear. There is a prominent
epicardial fat pad anteromedially on the right. The patient has undergone
previous median sternotomy. There is no pleural effusion.

Within the abdomen the bowel gas pattern is nonspecific. There is no
evidence of an ileus or obstructive the pattern. There are surgical clips in
the gallbladder fossa. There are phleboliths within the pelvis. No abnormal
calcifications project over either kidney. The lumbar spine and bony pelvis
are grossly normal where visualized.
IMPRESSION: 1. The bowel gas pattern is nonspecific. There is no evidence of ileus nor
obstruction or perforation.
2. I do not see evidence of acute cardiopulmonary abnormality.

[REDACTED]

## 2013-10-25 ENCOUNTER — Other Ambulatory Visit: Payer: Self-pay | Admitting: Internal Medicine

## 2013-10-28 ENCOUNTER — Ambulatory Visit: Payer: Self-pay | Admitting: Oncology

## 2013-11-20 ENCOUNTER — Ambulatory Visit: Payer: Self-pay | Admitting: Oncology

## 2013-12-07 ENCOUNTER — Ambulatory Visit: Payer: Self-pay | Admitting: Oncology

## 2013-12-21 ENCOUNTER — Ambulatory Visit: Payer: Self-pay | Admitting: Oncology

## 2014-02-09 ENCOUNTER — Other Ambulatory Visit: Payer: Self-pay | Admitting: Internal Medicine

## 2014-05-11 ENCOUNTER — Telehealth: Payer: Self-pay | Admitting: Internal Medicine

## 2014-05-11 NOTE — Telephone Encounter (Signed)
Patient ask for lab appointment for physical.

## 2014-06-08 ENCOUNTER — Other Ambulatory Visit: Payer: BC Managed Care – PPO

## 2014-06-15 ENCOUNTER — Ambulatory Visit: Payer: BC Managed Care – PPO | Admitting: Internal Medicine

## 2014-10-03 ENCOUNTER — Telehealth: Payer: Self-pay | Admitting: Urology

## 2014-10-03 NOTE — Telephone Encounter (Signed)
Patient was seen at the Urgent Care next to [place] on [address] for an UTI over the weekend.    Would you call and get those records?

## 2014-10-03 NOTE — Telephone Encounter (Signed)
Spoke to [place] at [place] who stated she would fax over records. Cw,lpn

## 2014-10-05 ENCOUNTER — Telehealth: Payer: Self-pay | Admitting: Urology

## 2014-10-05 NOTE — Telephone Encounter (Signed)
Pt called back and [name] spoke with him. Cw,lpn

## 2014-10-05 NOTE — Telephone Encounter (Signed)
Tony Cox said he'd like a phone call directly  from [facility], though I thought it necessary to send to [name] first since she's the triage nurse. Tony Cox said he saw Tony Cox on Monday, June 13th in regards to her receiving culture results from Urgent Care on 10-01-14. He also has a question regarding a Rx that Urgent Care just prescribed for him. He'd like a phone call please. Pt ph# [phone] Thank you.

## 2014-10-05 NOTE — Telephone Encounter (Signed)
I haven't received the UCx from the Urgent care.  I want those results from the Urgent Care, so I can talk with the patient today.

## 2014-10-05 NOTE — Telephone Encounter (Signed)
Would you like to call this pt or me?

## 2014-10-10 ENCOUNTER — Encounter: Payer: Self-pay | Admitting: Urology

## 2014-10-10 ENCOUNTER — Ambulatory Visit (INDEPENDENT_AMBULATORY_CARE_PROVIDER_SITE_OTHER): Payer: BC Managed Care – PPO | Admitting: Urology

## 2014-10-10 VITALS — BP 107/70 | HR 67 | Ht 66.0 in | Wt 144.6 lb

## 2014-10-10 DIAGNOSIS — N390 Urinary tract infection, site not specified: Secondary | ICD-10-CM

## 2014-10-10 DIAGNOSIS — C679 Malignant neoplasm of bladder, unspecified: Secondary | ICD-10-CM | POA: Diagnosis not present

## 2014-10-10 DIAGNOSIS — R399 Unspecified symptoms and signs involving the genitourinary system: Secondary | ICD-10-CM | POA: Diagnosis not present

## 2014-10-10 DIAGNOSIS — A499 Bacterial infection, unspecified: Secondary | ICD-10-CM | POA: Insufficient documentation

## 2014-10-10 DIAGNOSIS — K4090 Unilateral inguinal hernia, without obstruction or gangrene, not specified as recurrent: Secondary | ICD-10-CM | POA: Insufficient documentation

## 2014-10-10 DIAGNOSIS — Z8551 Personal history of malignant neoplasm of bladder: Secondary | ICD-10-CM | POA: Insufficient documentation

## 2014-10-10 LAB — MICROSCOPIC EXAMINATION

## 2014-10-10 LAB — URINALYSIS, COMPLETE
Bilirubin, UA: NEGATIVE
Glucose, UA: NEGATIVE
Ketones, UA: NEGATIVE
Leukocytes, UA: NEGATIVE
Nitrite, UA: NEGATIVE
Protein, UA: NEGATIVE
RBC, UA: NEGATIVE
Specific Gravity, UA: 1.01 (ref 1.005–1.030)
Urobilinogen, Ur: 0.2 mg/dL (ref 0.2–1.0)
pH, UA: 7 (ref 5.0–7.5)

## 2014-10-10 NOTE — Progress Notes (Signed)
10/10/2014 3:14 PM   Tony Cox 1950/12/23 [id number]  Referring provider: No referring provider defined for this encounter.  Chief Complaint  Patient presents with  . Follow-up    recheck uti    HPI: Tony Cox is a 64 year old white male who is status post robotic assisted cystectomy/prostatectomy with ileo conduit diversion performed by Tony Cox the on 01/28/2013 who presents today after being diagnosed with a urinary tract infection positive for enterococcus and Escherichia coli and placed on Macrobid on 10/01/2014.   Patient started having feelings of malaise, fevers, slight chills, nausea and suprapubic pain. He also had noted cloudy urine in his urostomy bag. [facility] and diagnosed with a urinary tract infection and placed on Levaquin. Once the sensitivities were available,  he was switched to Macrobid 100 mg 1 capsule twice daily.   He is having a difficult time tolerating the Macrobid. The antibiotic is causing him to have  stomach cramping, nausea and diarrhea. He is currently not having fevers, chills, nausea, vomiting or gross hematuria. He would like a urinalysis performed to see if he may stop the antibiotic at this time.  Patient's bladder cancer and prostate cancer are being managed through [facility] in [place].   PMH: Past Medical History  Diagnosis Date  . Hyperlipidemia   . Hypertension   . 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 Heart murmur   . GERD (gastroesophageal reflux disease)   . Depression   . Bladder tumor     cancer  . History of atrial fibrillation without current medication     POST OP MITRIAL VALVE REPLACEMENT IN 2004    Surgical History: Past Surgical History  Procedure Laterality Date  . Valvuloplasty with replacement of cosgrove ring,1 congenital ruptur of cordi  2004    [facility]  . Transthoracic echocardiogram  11-03-2008  Tony Cox Tony Cox)    LVF NORMAL/ EF 55-60%/ LEFT ATRIAL ENLARGEMENT/  ADEQUATELY FUNCTIONING MITRAL VALVE REPAIR/  MILD MR & TRI/  NO SIGNIFICANT CHANGE FROM PREVIOUS ECHO  . Tonsillectomy  AS CHILD  . Cholecystectomy  2011  . Transurethral resection of bladder tumor N/A 07/27/2012    Procedure: TRANSURETHRAL RESECTION OF BLADDER TUMOR (TURBT);  Surgeon: Tony Gallo, MD;  Location: [facility];  Service: Urology;  Laterality: N/A;  WITH MITOMYCIN INSTILLATION NEED GYRUS    . Hernia repair  6144    umbilical   . Colonoscopy  2009    Home Medications:    Medication List       This list is accurate as of: 10/10/14  3:14 PM.  Always use your most recent med list.               aspirin EC 81 MG tablet  Take 81 mg by mouth daily.     carvedilol 10 MG 24 hr capsule  Commonly known as:  COREG CR  Take 10 mg by mouth every evening.     desonide 0.05 % lotion  Commonly known as:  DESOWEN     DEXILANT 60 MG capsule  Generic drug:  dexlansoprazole  Take 60 mg by mouth.     FLAX SEED OIL PO  Take 1 capsule by mouth daily.     fluticasone 50 MCG/ACT nasal spray  Commonly known as:  FLONASE  hydrocortisone-pramoxine 2.5-1 % rectal cream  Commonly known as:  ANALPRAM-HC  APPLY A SMALL AMOUNT TWICE  A DAY AS NEEDED     mirtazapine 15 MG tablet  Commonly known as:  REMERON  Take 1 tablet (15 mg total) by mouth at bedtime.     mirtazapine 15 MG tablet  Commonly known as:  REMERON  Take 7.5 mg by mouth at bedtime.     nitrofurantoin (macrocrystal-monohydrate) 100 MG capsule  Commonly known as:  MACROBID  Take 100 mg by mouth 2 (two) times daily.     omeprazole 20 MG capsule  Commonly known as:  PRILOSEC  Take 40 mg by mouth every morning.     PRILOSEC OTC 20 MG tablet  Generic drug:  omeprazole  Take by mouth.     SUDAFED PE SINUS/ALLERGY 4-10 MG per tablet  Generic drug:   Chlorpheniramine-Phenylephrine  Take 1 tablet by mouth every morning.        Allergies:  Allergies  Allergen Reactions  . Codeine Nausea And Vomiting and Hives    Family History: Family History  Problem Relation Age of Onset  . Adopted: Yes  . Colon cancer Mother   . Cancer Neg Hx     Prostate,Kidney,Bladder    Social History:  reports that he has quit smoking. He has never used smokeless tobacco. He reports that he drinks alcohol. He reports that he does not use illicit drugs.  ROS: Urological Symptom Review  Patient is experiencing the following symptoms: Urinary tract infection   Review of Systems  Gastrointestinal (upper)  : Negative for upper GI symptoms  Gastrointestinal (lower) : Negative for lower GI symptoms  Constitutional : Negative for symptoms  Skin: Negative for skin symptoms  Eyes: Negative for eye symptoms  Ear/Nose/Throat : Negative for Ear/Nose/Throat symptoms  Hematologic/Lymphatic: Negative for Hematologic/Lymphatic symptoms  Cardiovascular : Negative for cardiovascular symptoms  Respiratory : Negative for respiratory symptoms  Endocrine: Negative for endocrine symptoms  Musculoskeletal: Negative for musculoskeletal symptoms  Neurological: Negative for neurological symptoms  Psychologic: Negative for psychiatric symptoms   Physical Exam: BP 107/70 mmHg  Pulse 67  Ht 5\' 6"  (1.676 m)  Wt 144 lb 9.6 oz (65.59 kg)  BMI 23.35 kg/m2   Laboratory Data: Results for orders placed or performed in visit on 10/10/14  Microscopic Examination  Result Value Ref Range   WBC, UA 0-5 0 -  5 /hpf   RBC, UA 0-2 0 -  2 /hpf   Epithelial Cells (non renal) 0-10 0 - 10 /hpf   Bacteria, UA Few None seen/Few  Urinalysis, Complete  Result Value Ref Range   Specific Gravity, UA 1.010 1.005 - 1.030   pH, UA 7.0 5.0 - 7.5   Color, UA Yellow Yellow   Appearance Ur Clear Clear   Leukocytes, UA Negative Negative   Protein, UA Negative  Negative/Trace   Glucose, UA Negative Negative   Ketones, UA Negative Negative   RBC, UA Negative Negative   Bilirubin, UA Negative Negative   Urobilinogen, Ur 0.2 0.2 - 1.0 mg/dL   Nitrite, UA Negative Negative   Microscopic Examination See below:    Lab Results  Component Value Date   WBC 12.4* 11/30/2012   HGB 11.9* 11/30/2012   HCT 35.3* 11/30/2012   MCV 92 11/30/2012   PLT 193 11/30/2012    Lab Results  Component Value Date   CREATININE 0.91 11/09/2012    Lab Results  Component Value Date   PSA 2.1 10/19/2012  PSA 3.16 05/31/2011   PSA 1.80 10/09/2009    Lab Results  Component Value Date   TESTOSTERONE 489.65 05/31/2011    No results found for: HGBA1C  Urinalysis    Component Value Date/Time   COLORURINE LT. YELLOW 07/08/2012 0756   APPEARANCEUR CLEAR 07/08/2012 0756   LABSPEC 1.010 07/08/2012 0756   PHURINE 5.5 07/08/2012 0756   GLUCOSEU Negative 10/10/2014 0949   GLUCOSEU NEGATIVE 07/08/2012 0756   HGBUR LARGE 07/08/2012 0756   HGBUR negative 10/09/2009 0759   BILIRUBINUR Negative 10/10/2014 [place] 07/08/2012 0756   BILIRUBINUR neg 07/07/2012 1654   KETONESUR NEGATIVE 07/08/2012 0756   PROTEINUR neg 07/07/2012 1654   UROBILINOGEN 0.2 07/08/2012 0756   UROBILINOGEN 0.2 07/07/2012 1654   NITRITE Negative 10/10/2014 0949   NITRITE NEGATIVE 07/08/2012 0756   NITRITE neg 07/07/2012 1654   LEUKOCYTESUR Negative 10/10/2014 0949   LEUKOCYTESUR NEGATIVE 07/08/2012 0756    Pertinent Imaging:  Assessment & Plan:   1. Urinary tract infection symptoms:  Patient was diagnosed with a urinary tract infection positive for enterococcus and Escherichia coli and placed on Macrobid on 10/01/2014.  Unfortunately, the Macrobid is causing diarrhea and the patient is finding it intolerable. He is wanting to stop the antibiotic. Because of his past history of an ileal conduit, I would like him to stay on the Macrobid until urine culture results are  available. I have asked the urine culture to be done stat in hopes that we would have a result sooner than the standard 5-7 days.  - Urinalysis, Complete - Urine culture, comprehensive STAT  2. Bladder cancer:  Patient is status post robotic assisted cystectomy/prostatectomy with ileo conduit diversion performed by Tony Cox the on 01/28/2013.  He is being followed at [facility] in [place].  Patient has an appointment with them next week and will return with his records for our file.    3. Prostate cancer:   Patient is status post robotic assisted cystectomy/prostatectomy with ileo conduit diversion performed by Tony Cox the on 01/28/2013.  He is being followed at [facility] in [place].  Patient has an appointment with them next week and will return with his records for our file.       No Follow-up on file.  Zara Council, [facility] [address], [place], [place] [address] [phone]

## 2014-10-12 ENCOUNTER — Telehealth: Payer: Self-pay

## 2014-10-12 NOTE — Telephone Encounter (Signed)
-----   Message from Nori Riis, PA-C sent at 10/12/2014  8:42 AM EDT ----- Preliminary report states he still has an infection.  He needs to stay on Macrobid until the sensitivities are ready.

## 2014-10-12 NOTE — Telephone Encounter (Signed)
Spoke with pt in reference to infection. Pt stated he only has 2 pills left of macrobid. Per [name] complete macrobid and when we receive final cx results we will call in another abt accordingly. Pt stated he will be traveling to [place] in the morning. Per [name], if need be, new abt will be called into a pharmacy in [place]. Pt voiced understanding. Cw,lpn

## 2014-10-13 LAB — CULTURE, URINE COMPREHENSIVE

## 2014-10-14 ENCOUNTER — Telehealth: Payer: Self-pay

## 2014-10-14 NOTE — Telephone Encounter (Signed)
LMOM- we need pt email address

## 2014-10-14 NOTE — Telephone Encounter (Signed)
-----   Message from Nori Riis, PA-C sent at 10/13/2014  9:29 PM EDT ----- Spoke with patient concerning his results. We need to e-mail him the results for his appointment in [place].

## 2014-10-14 NOTE — Telephone Encounter (Signed)
Pt returned call. [name] spoke with pt and handled all results. Cw,lpn

## 2014-11-02 ENCOUNTER — Inpatient Hospital Stay: Payer: BC Managed Care – PPO | Attending: Oncology | Admitting: Oncology

## 2014-11-05 ENCOUNTER — Other Ambulatory Visit: Payer: Self-pay | Admitting: Urology

## 2014-11-07 NOTE — Telephone Encounter (Signed)
Can pt have refill?  

## 2014-11-16 ENCOUNTER — Encounter: Payer: Self-pay | Admitting: Oncology

## 2014-11-16 ENCOUNTER — Inpatient Hospital Stay: Payer: BC Managed Care – PPO | Attending: Oncology | Admitting: Oncology

## 2014-11-16 VITALS — BP 125/79 | HR 76 | Temp 97.6°F | Wt 146.6 lb

## 2014-11-16 DIAGNOSIS — C679 Malignant neoplasm of bladder, unspecified: Secondary | ICD-10-CM

## 2014-11-16 NOTE — Progress Notes (Signed)
Patient does have living will.  Former smoker. 

## 2015-03-06 ENCOUNTER — Other Ambulatory Visit: Payer: Self-pay | Admitting: Urology

## 2015-03-06 MED ORDER — CIPROFLOXACIN HCL 500 MG PO TABS: 500.0000 mg | ORAL_TABLET | Freq: Two times a day (BID) | ORAL | Status: DC

## 2015-03-06 MED ORDER — LEVOFLOXACIN 500 MG PO TABS: 500.0000 mg | ORAL_TABLET | Freq: Every day | ORAL | Status: DC

## 2015-04-06 ENCOUNTER — Encounter: Payer: Self-pay | Admitting: *Deleted

## 2015-04-11 ENCOUNTER — Encounter: Payer: Self-pay | Admitting: *Deleted

## 2015-04-18 ENCOUNTER — Encounter: Payer: Self-pay | Admitting: Physician Assistant

## 2015-05-09 DIAGNOSIS — J310 Chronic rhinitis: Secondary | ICD-10-CM | POA: Insufficient documentation

## 2015-06-14 ENCOUNTER — Encounter: Payer: Self-pay | Admitting: General Surgery

## 2015-06-14 ENCOUNTER — Ambulatory Visit (INDEPENDENT_AMBULATORY_CARE_PROVIDER_SITE_OTHER): Payer: Medicare Other | Admitting: General Surgery

## 2015-06-14 VITALS — BP 118/66 | HR 70 | Resp 12 | Ht 66.0 in | Wt 148.0 lb

## 2015-06-14 DIAGNOSIS — N99523 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: Secondary | ICD-10-CM | POA: Insufficient documentation

## 2015-06-14 DIAGNOSIS — Z86010 Personal history of colonic polyps: Secondary | ICD-10-CM | POA: Diagnosis not present

## 2015-06-14 HISTORY — DX: 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: N99.523

## 2015-06-14 NOTE — Progress Notes (Signed)
Patient ID: Tony Cox, male   DOB: 07-09-1950, 65 y.o.   MRN: [id number]  Chief Complaint  Patient presents with  . Colonoscopy    HPI Tony Cox is a 65 y.o. male.  Here today for colonoscopy discussion. Last completed was 2013. A single 5 mm tubular adenoma without atypia was noted that time. Denies any gastrointestinal issues. He states his bowels move daily and no bleeding.  He is noted increased asymmetry in the lower abdominal wall now being managed with a compressive belt.    HPI  Past Medical History  Diagnosis Date  . Hyperlipidemia   . Hypertension   . 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 Heart murmur   . GERD (gastroesophageal reflux disease)   . Depression   . Bladder tumor     cancer / T2 N0  . History of atrial fibrillation without current medication     POST OP MITRIAL VALVE REPLACEMENT IN 2004  . Cancer [facility]) 2015    bladder    Past Surgical History  Procedure Laterality Date  . Valvuloplasty with replacement of cosgrove ring,1 congenital ruptur of cordi  2004   [facility]  . Transthoracic echocardiogram  11-03-2008  DR FATH [name])    LVF NORMAL/ EF 55-60%/ LEFT ATRIAL ENLARGEMENT/  ADEQUATELY FUNCTIONING MITRAL VALVE REPAIR/  MILD MR & TRI/  NO SIGNIFICANT CHANGE FROM PREVIOUS ECHO  . Tonsillectomy  AS CHILD  . Cholecystectomy  2011  . Transurethral resection of bladder tumor N/A 07/27/2012    Procedure: TRANSURETHRAL RESECTION OF BLADDER TUMOR (TURBT);  Surgeon: Franchot Gallo, MD;  Location: [facility];  Service: Urology;  Laterality: N/A;  WITH MITOMYCIN INSTILLATION NEED GYRUS    . Hernia repair  [id number]    umbilical   . Colonoscopy  2009, 2013    Dr Vassie Moment ADENOMA     Family History  Problem Relation Age of Onset  . Adopted: Yes  . Colon cancer Mother   . Cancer Neg Hx      Prostate,Kidney,Bladder    Social History Social History  Substance Use Topics  . Smoking status: Former Research scientist (life sciences)  . Smokeless tobacco: Never Used     Comment: quit 2 years  . Alcohol Use: 0.0 oz/week    0 Standard drinks or equivalent per week     Comment: occasional    Allergies  Allergen Reactions  . Codeine Nausea And Vomiting and Hives    Current Outpatient Prescriptions  Medication Sig Dispense Refill  . carvedilol (COREG CR) 10 MG 24 hr capsule Take 10 mg by mouth every evening.    . Clindamycin Phosphate foam APPLY 1 GRAM ON SKIN DAILY  5  . desonide (DESOWEN) 0.05 % lotion     . dexlansoprazole (DEXILANT) 60 MG capsule Take 60 mg by mouth.    . dicyclomine (BENTYL) 10 MG capsule     . gabapentin (NEURONTIN) 300 MG capsule TAKE 1 CAPSULE BY MOUTH FOUR TIMES DAILY (Patient taking differently: TAKE 1 CAPSULE BY MOUTH three TIMES DAILY) 120 capsule 12  . hydrocortisone-pramoxine (ANALPRAM-HC) 2.5-1 % rectal cream APPLY A SMALL AMOUNT TWICE  A DAY AS NEEDED 30 g 0  . mirtazapine (REMERON) 15 MG tablet Take 1 tablet (15 mg total) by mouth at bedtime. 30 tablet 2   No current facility-administered medications for this visit.  Review of Systems Review of Systems  Constitutional: Negative.   Respiratory: Negative.   Cardiovascular: Negative.     Blood pressure 118/66, pulse 70, resp. rate 12, height 5\' 6"  (1.676 m), weight 148 lb (67.132 kg).  Physical Exam Physical Exam  Constitutional: He is oriented to person, place, and time. He appears well-developed and well-nourished.  HENT:  Mouth/Throat: Oropharynx is clear and moist.  Eyes: Conjunctivae are normal. No scleral icterus.  Neck: Neck supple.  Cardiovascular: Normal rate, regular rhythm and normal heart sounds.   Pulmonary/Chest: Effort normal and breath sounds normal.  Abdominal: Soft. Normal appearance and bowel sounds are normal. There is no tenderness.    Genitourinary:  Urostomy present.   Lymphadenopathy:    He has no cervical adenopathy.  Neurological: He is alert and oriented to person, place, and time.  Skin: Skin is warm and dry.  Psychiatric: His behavior is normal.    Data Reviewed 2013 colonoscopy.  Assessment    Candidate for follow colonoscopy in one year.  Reassessment at [facility] in [place] next week re: bladder cancer and possible abdominal wall defect.      Plan    Pros and cons of hernia repair reviewed. Risks associated with prosthetic mesh discussed. Patient reports intermittent burning and tingling of the stoma site, presently on Neurontin 300 mg 3 times a day. Higher doses produced undue sedation. He is able to obtain relief with the use of Tylenol. He is encouraged to make use of a delayed release Tylenol preparation twice a day( 1000 mg 3 times a day) to minimize episodes of discomfort, decreasing to the lowest effective dose.   He is scheduled to have a follow-up CT at his appointment next month and we'll be bringing a disc by the office for my review.    Colonoscopy due 2018.   PCP:  Deborra Medina  This information has been scribed by Karie Fetch Trail.  Tony Cox 06/14/2015, 4:42 PM

## 2015-06-14 NOTE — Patient Instructions (Addendum)
Colonoscopy due 2018.

## 2015-09-07 ENCOUNTER — Other Ambulatory Visit: Payer: Self-pay | Admitting: Internal Medicine

## 2015-09-08 ENCOUNTER — Other Ambulatory Visit: Payer: Self-pay | Admitting: Urology

## 2015-09-08 ENCOUNTER — Telehealth: Payer: Self-pay | Admitting: Urology

## 2015-09-08 MED ORDER — TRAMADOL HCL 50 MG PO TABS: 50.0000 mg | ORAL_TABLET | Freq: Four times a day (QID) | ORAL | Status: DC | PRN

## 2015-09-08 NOTE — Telephone Encounter (Signed)
Patient called requesting a refill for tramadol due to pain he experiences with his ostomy when he walks for long distances. He is taking a group on a field trip in the near future and will be experiencing times of extended walking.  Tramadol 50 mg, one tablet every 4 hours when necessary for pain, #[facility]

## 2015-09-19 ENCOUNTER — Telehealth: Payer: Self-pay | Admitting: Urology

## 2015-09-19 ENCOUNTER — Other Ambulatory Visit: Payer: Self-pay | Admitting: Urology

## 2015-09-19 MED ORDER — DICYCLOMINE HCL 10 MG PO CAPS: 10.0000 mg | ORAL_CAPSULE | Freq: Two times a day (BID) | ORAL | Status: DC

## 2015-09-19 NOTE — Telephone Encounter (Signed)
Patient called requesting a refill of his Bentyl until he can see Dr. Derrel Nip on the 23rd of June.  I have sent a month's worth of the script for the patient.

## 2015-10-13 ENCOUNTER — Encounter: Payer: Self-pay | Admitting: Internal Medicine

## 2015-10-13 ENCOUNTER — Ambulatory Visit (INDEPENDENT_AMBULATORY_CARE_PROVIDER_SITE_OTHER): Payer: Medicare Other | Admitting: Internal Medicine

## 2015-10-13 VITALS — BP 120/76 | HR 62 | Temp 98.3°F | Resp 12 | Ht 67.0 in | Wt 144.0 lb

## 2015-10-13 DIAGNOSIS — H53139 Sudden visual loss, unspecified eye: Secondary | ICD-10-CM | POA: Diagnosis not present

## 2015-10-13 DIAGNOSIS — G629 Polyneuropathy, unspecified: Secondary | ICD-10-CM

## 2015-10-13 DIAGNOSIS — R5383 Other fatigue: Secondary | ICD-10-CM

## 2015-10-13 DIAGNOSIS — Z0000 Encounter for general adult medical examination without abnormal findings: Secondary | ICD-10-CM

## 2015-10-13 DIAGNOSIS — Z9079 Acquired absence of other genital organ(s): Secondary | ICD-10-CM

## 2015-10-13 DIAGNOSIS — I10 Essential (primary) hypertension: Secondary | ICD-10-CM

## 2015-10-13 DIAGNOSIS — C801 Malignant (primary) neoplasm, unspecified: Secondary | ICD-10-CM

## 2015-10-13 DIAGNOSIS — E785 Hyperlipidemia, unspecified: Secondary | ICD-10-CM | POA: Diagnosis not present

## 2015-10-13 DIAGNOSIS — E559 Vitamin D deficiency, unspecified: Secondary | ICD-10-CM | POA: Diagnosis not present

## 2015-10-13 DIAGNOSIS — Z86010 Personal history of colonic polyps: Secondary | ICD-10-CM

## 2015-10-13 DIAGNOSIS — C689 Malignant neoplasm of urinary organ, unspecified: Secondary | ICD-10-CM | POA: Diagnosis not present

## 2015-10-13 DIAGNOSIS — K4090 Unilateral inguinal hernia, without obstruction or gangrene, not specified as recurrent: Secondary | ICD-10-CM

## 2015-10-13 DIAGNOSIS — H53452 Other localized visual field defect, left eye: Secondary | ICD-10-CM

## 2015-10-13 LAB — CBC WITH DIFFERENTIAL/PLATELET
Basophils Absolute: 54 cells/uL (ref 0–200)
Basophils Relative: 1 %
Eosinophils Absolute: 108 cells/uL (ref 15–500)
Eosinophils Relative: 2 %
HCT: 45.9 % (ref 38.5–50.0)
Hemoglobin: 15.4 g/dL (ref 13.2–17.1)
Lymphocytes Relative: 30 %
Lymphs Abs: 1620 cells/uL (ref 850–3900)
MCH: 31 pg (ref 27.0–33.0)
MCHC: 33.6 g/dL (ref 32.0–36.0)
MCV: 92.5 fL (ref 80.0–100.0)
MPV: 9.8 fL (ref 7.5–12.5)
Monocytes Absolute: 594 cells/uL (ref 200–950)
Monocytes Relative: 11 %
Neutro Abs: 3024 cells/uL (ref 1500–7800)
Neutrophils Relative %: 56 %
Platelets: 176 10*3/uL (ref 140–400)
RBC: 4.96 MIL/uL (ref 4.20–5.80)
RDW: 13.2 % (ref 11.0–15.0)
WBC: 5.4 10*3/uL (ref 3.8–10.8)

## 2015-10-13 LAB — TSH: TSH: 1.2 mIU/L (ref 0.40–4.50)

## 2015-10-13 MED ORDER — DEXLANSOPRAZOLE 60 MG PO CPDR: 60.0000 mg | DELAYED_RELEASE_CAPSULE | Freq: Every day | ORAL | Status: DC

## 2015-10-13 MED ORDER — CLINDAMYCIN PHOSPHATE 1 % EX FOAM: CUTANEOUS | Status: DC

## 2015-10-13 MED ORDER — PREGABALIN 50 MG PO CAPS: 50.0000 mg | ORAL_CAPSULE | Freq: Three times a day (TID) | ORAL | Status: DC

## 2015-10-13 MED ORDER — DICYCLOMINE HCL 10 MG PO CAPS: 10.0000 mg | ORAL_CAPSULE | Freq: Two times a day (BID) | ORAL | Status: DC

## 2015-10-13 MED ORDER — ALBUTEROL SULFATE HFA 108 (90 BASE) MCG/ACT IN AERS: 2.0000 | INHALATION_SPRAY | Freq: Four times a day (QID) | RESPIRATORY_TRACT | Status: DC | PRN

## 2015-10-13 MED ORDER — MIRTAZAPINE 15 MG PO TABS: 15.0000 mg | ORAL_TABLET | Freq: Every day | ORAL | Status: DC

## 2015-10-13 NOTE — Progress Notes (Signed)
Pre-visit discussion using our clinic review tool. No additional management support is needed unless otherwise documented below in the visit note.  

## 2015-10-14 LAB — COMPREHENSIVE METABOLIC PANEL
ALT: 17 U/L (ref 9–46)
AST: 16 U/L (ref 10–35)
Albumin: 4.6 g/dL (ref 3.6–5.1)
Alkaline Phosphatase: 68 U/L (ref 40–115)
BUN: 14 mg/dL (ref 7–25)
CO2: 25 mmol/L (ref 20–31)
Calcium: 9.6 mg/dL (ref 8.6–10.3)
Chloride: 103 mmol/L (ref 98–110)
Creat: 0.95 mg/dL (ref 0.70–1.25)
Glucose, Bld: 98 mg/dL (ref 65–99)
Potassium: 4.2 mmol/L (ref 3.5–5.3)
Sodium: 140 mmol/L (ref 135–146)
Total Bilirubin: 0.6 mg/dL (ref 0.2–1.2)
Total Protein: 6.9 g/dL (ref 6.1–8.1)

## 2015-10-14 LAB — LIPID PANEL
Cholesterol: 195 mg/dL (ref 125–200)
HDL: 63 mg/dL (ref >=40)
LDL Cholesterol: 110 mg/dL (ref <130)
Total CHOL/HDL Ratio: 3.1 Ratio (ref <=5.0)
Triglycerides: 112 mg/dL (ref <150)
VLDL: 22 mg/dL (ref <30)

## 2015-10-14 LAB — VITAMIN D 25 HYDROXY (VIT D DEFICIENCY, FRACTURES): Vit D, 25-Hydroxy: 38 ng/mL (ref 30–100)

## 2015-10-14 LAB — MAGNESIUM: Magnesium: 2 mg/dL (ref 1.5–2.5)

## 2015-10-15 ENCOUNTER — Encounter: Payer: Self-pay | Admitting: Internal Medicine

## 2015-10-15 DIAGNOSIS — Z9079 Acquired absence of other genital organ(s): Secondary | ICD-10-CM | POA: Insufficient documentation

## 2015-10-15 DIAGNOSIS — G629 Polyneuropathy, unspecified: Secondary | ICD-10-CM | POA: Insufficient documentation

## 2015-10-15 DIAGNOSIS — Z0000 Encounter for general adult medical examination without abnormal findings: Secondary | ICD-10-CM | POA: Insufficient documentation

## 2015-10-15 DIAGNOSIS — C801 Malignant (primary) neoplasm, unspecified: Secondary | ICD-10-CM | POA: Insufficient documentation

## 2015-10-15 NOTE — Assessment & Plan Note (Signed)
Postsurgical secondary to radical cystectomy .  He is avoiding lifting and gaining weight.

## 2015-10-15 NOTE — Assessment & Plan Note (Signed)
Well controlled on current regimen. Renal function stable, no changes today.\  Lab Results  Component Value Date   CREATININE 0.95 10/13/2015   Lab Results  Component Value Date   NA 140 10/13/2015   K 4.2 10/13/2015   CL 103 10/13/2015   CO2 25 10/13/2015

## 2015-10-15 NOTE — Assessment & Plan Note (Signed)
Tubular adenoma , by colonosocpy eb 2013.  Follow up due in  2018

## 2015-10-15 NOTE — Assessment & Plan Note (Signed)
Based on current lipid profile, the risk of clinically significant CAD is 16% over the next 10 years, using the Framingham risk calculator for men. The [employer] of Cardiology recommends starting patients aged 65 or higher on moderate intensity statin therapy for LDL between 70-189 and 10 yr risk of CAD > 7.5% .  Will discuss statin therapy with patient.   Lab Results  Component Value Date   CHOL 195 10/13/2015   HDL 63 10/13/2015   LDLCALC 110 10/13/2015   LDLDIRECT 135.5 01/16/2010   TRIG 112 10/13/2015   CHOLHDL 3.1 10/13/2015

## 2015-10-15 NOTE — Assessment & Plan Note (Signed)
Acquired, secondary to surgery.  Discussed trial of Lyrica for control of pain given side effects of gabapentin. rx given.

## 2015-10-15 NOTE — Patient Instructions (Signed)

## 2015-10-15 NOTE — Assessment & Plan Note (Signed)
Annual comprehensive preventive exam was done as well as an evaluation and management of acute and chronic conditions .  During the course of the visit the patient was educated and counseled about appropriate screening and preventive services including :  diabetes screening, lipid analysis with projected  10 year  risk for CAD , nutrition counseling,  colorectal cancer screening, and recommended immunizations.  Printed recommendations for health maintenance screenings was given.  

## 2015-10-15 NOTE — Assessment & Plan Note (Signed)
T2 urothelial carcinoma of bladder, s/p neoadjuvant chemo, radical cystectomy with ilealconduit diversion.  No recurrence at 3 years.  

## 2015-10-15 NOTE — Progress Notes (Signed)
**Note Tony-Identified via Obfuscation** Patient ID: Tony Cox, male    DOB: 1950/11/15  Age: 65 y.o. MRN: [id number]  The patient is here for a wellness examination and to establish care.    The risk factors are reflected in the social history.  The roster of all physicians providing medical care to patient - is listed in the Snapshot section of the chart.  Home safety : The patient has smoke detectors in the home. They wear seatbelts.  There are no firearms at home. There is no violence in the home.   There is no risks for hepatitis, STDs or HIV. There is no   history of blood transfusion. They have no travel history to infectious disease endemic areas of the world.  The patient has seen their dentist in the last six month. They have seen their eye doctor in the last year. They admit to slight hearing difficulty with regard to whispered voices and some television programs.  They have deferred audiologic testing in the last year.  They do not  have excessive sun exposure. Discussed the need for sun protection: hats, long sleeves and use of sunscreen if there is significant sun exposure.   Diet: the importance of a healthy diet is discussed. They do have a healthy diet.  The benefits of regular aerobic exercise were discussed. He is walking daily  8 miles for exercise.   Depression screen: there are no signs or vegative symptoms of depression- irritability, change in appetite, anhedonia, sadness/tearfullness.   The following portions of the patient's history were reviewed and updated as appropriate: allergies, current medications, past family history, past medical history,  past surgical history, past social history  and problem list.  Visual acuity was not assessed per patient preference since she has regular follow up with her ophthalmologist. Hearing and body mass index were assessed and reviewed.   During the course of the visit the patient's history of  appropriate screening and preventive services was reviewed and update   including : fall prevention , diabetes screening, nutrition counseling, colorectal cancer screening, and recommended immunizations.    CC: The primary encounter diagnosis was Vision, loss, sudden, unspecified laterality. Diagnoses of Decreased peripheral vision of left eye, Vitamin D deficiency, Hyperlipidemia, Other fatigue, Urothelial carcinoma ([facility]), Unilateral inguinal hernia without obstruction or gangrene, recurrence not specified, Essential hypertension, Cancer (Tony Cox), Hyperlipidemia LDL goal <130, History of colonic polyps, Visit for preventive health examination, S/P prostatectomy, and Sensory neuropathy (Tony Cox) were also pertinent to this visit.    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   Confirmed by cystoscopy and TURBT revealing left trigone and sidewall bladder tumor, with no regional or distant mets. After obtaining several opinions Tony Cox and Tony Cox), he underwent neoadjuvant chemotherapy  Beginning in May 2014 followed by radical cystoprostatectomy, bilateral pelvic lymphadenectomy and ileal conduit urinary diversion which was done at Tony Cox by Tony Cox.  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  His after care was transitioned to Tony Cox after becoming dissatisfied with the care, particular the stoma care /nursing support  and the lack of psychiatric counselling, at Tony Cox.  he has follow up every 4 months.  There has been no recurrence thus fat (5 year disease free survival  Is reportedly  is 50%) His initial difficulty with  maintaining his stoma without leakage and irritation resolved  With Urethral washings,  Etc. He has developed a post surgical  right sided ventral hernia resulting from that surgery that he is careful to avoid aggravating by altering his  exercise routine and lifting activity and keeping his weight down (his goal is 140 lbs)       History Tony Cox has a past medical history of Hyperlipidemia; Hypertension; History of mitral valve repair (CARDIOLOGIST-- Tony Cox--  LOV NOTE W/ CHART 10-30-2011); Heart murmur; GERD (gastroesophageal reflux disease); Depression; History of atrial fibrillation without current medication; and Cancer (Tony Cox) (2015).   He has past surgical history that includes valvuloplasty with replacement of cosgrove ring,1 congenital ruptur of cordi (2004   [facility]); transthoracic echocardiogram (11-03-2008  Tony Ubaldo Glassing [name])); Tonsillectomy (AS CHILD); Cholecystectomy (2011); Transurethral resection of bladder tumor (N/A, 07/27/2012); Hernia repair (2011); Colonoscopy (2009, 2013); and cystectomy (2014).   His family history includes Colon cancer in his mother. There is no history of Cancer. He was adopted.He reports that he has quit smoking. He has never used smokeless tobacco. He reports that he drinks about 0.6 oz of alcohol per week. He reports that he does not use illicit drugs.  Outpatient Prescriptions Prior to Visit  Medication Sig Dispense Refill  . carvedilol (COREG CR) 10 MG 24 hr capsule Take 10 mg by mouth every evening.    . Clindamycin Phosphate foam APPLY 1 GRAM ON SKIN DAILY  5  . desonide (DESOWEN) 0.05 % lotion     . gabapentin (NEURONTIN) 300 MG capsule TAKE 1 CAPSULE BY MOUTH FOUR TIMES DAILY (Patient taking differently: TAKE 1 CAPSULE BY MOUTH three TIMES DAILY) 120 capsule 12  . hydrocortisone-pramoxine (ANALPRAM-HC) 2.5-1 % rectal cream APPLY A SMALL AMOUNT TWICE  A DAY AS NEEDED 30 g 0  . traMADol (ULTRAM) 50 MG tablet Take 1 tablet (50 mg total) by mouth every 6 (six) hours as needed. 30 tablet 0  . dexlansoprazole (DEXILANT) 60 MG capsule Take 60 mg by mouth.    . dicyclomine (BENTYL) 10 MG capsule Take 1 capsule (10 mg total) by mouth 2 (two) times daily. 60 capsule 0  .  mirtazapine (REMERON) 15 MG tablet Take 1 tablet (15 mg total) by mouth at bedtime. 30 tablet 2   No facility-administered medications prior to visit.    Review of Systems   Patient denies headache, fevers, malaise, unintentional weight loss, skin rash, eye pain, sinus congestion and sinus pain, sore throat, dysphagia,  hemoptysis , cough, dyspnea, wheezing, chest pain, palpitations, orthopnea, edema, abdominal pain, nausea, melena, diarrhea, constipation, flank pain, dysuria, hematuria, urinary  Frequency, nocturia, numbness, tingling, seizures,  Focal weakness, Loss of consciousness,  Tremor, insomnia, depression, anxiety, and suicidal ideation.      Objective:  BP 120/76 mmHg  Pulse 62  Temp(Src) 98.3 F (36.8 C) (Oral)  Resp 12  Ht 5\' 7"  (1.702 m)  Wt 144 lb (65.318 kg)  BMI 22.55 kg/m2  SpO2 97%  Physical Exam  General appearance: alert, cooperative and appears stated age Ears: normal TM's and external ear canals both ears Throat: lips, mucosa, and tongue normal; teeth and gums normal Neck: no adenopathy, no carotid bruit, supple, symmetrical, trachea midline and thyroid not enlarged, symmetric, no tenderness/mass/nodules Back: symmetric, no curvature. ROM normal. No CVA tenderness. Lungs: clear to auscultation bilaterally Heart: regular rate and rhythm, S1, S2 normal, no murmur, click, rub or gallop Abdomen: right sided stoma appears healthy, no irritation.  Ventral hernia ,  non-tender; bowel sounds normal; no masses,  no organomegaly Pulses: 2+ and symmetric Skin: Skin color, texture, turgor normal. No rashes or lesions Lymph nodes: Cervical, supraclavicular,  Inguinal, popliteal  and axillary nodes normal.  Assessment & Plan:   Problem List Items Addressed This Visit    Hyperlipidemia LDL goal <130    Based on current lipid profile, the risk of clinically significant CAD is 16% over the next 10 years, using the Framingham risk calculator for men. The [employer]  of Cardiology recommends starting patients aged 1 or higher on moderate intensity statin therapy for LDL between 70-189 and 10 yr risk of CAD > 7.5% .  Will discuss statin therapy with patient.   Lab Results  Component Value Date   CHOL 195 10/13/2015   HDL 63 10/13/2015   LDLCALC 110 10/13/2015   LDLDIRECT 135.5 01/16/2010   TRIG 112 10/13/2015   CHOLHDL 3.1 10/13/2015         Essential hypertension    Well controlled on current regimen. Renal function stable, no changes today.\  Lab Results  Component Value Date   CREATININE 0.95 10/13/2015   Lab Results  Component Value Date   NA 140 10/13/2015   K 4.2 10/13/2015   CL 103 10/13/2015   CO2 25 10/13/2015         Urothelial carcinoma ([facility])    T2 urothelial carcinoma of bladder, s/p neoadjuvant chemo, radical cystectomy with ilealconduit diversion.  No recurrence at 3 years.       Relevant Medications   pregabalin (LYRICA) 50 MG capsule   Hernia, inguinal    Postsurgical secondary to radical cystectomy .  He is avoiding lifting and gaining weight.       History of colonic polyps    Tubular adenoma , by colonosocpy eb 2013.  Follow up due in  2018      RESOLVED: Cancer ([place])   Relevant Medications   pregabalin (LYRICA) 50 MG capsule   Visit for preventive health examination    Annual comprehensive preventive exam was done as well as an evaluation and management of acute and chronic conditions .  During the course of the visit the patient was educated and counseled about appropriate screening and preventive services including :  diabetes screening, lipid analysis with projected  10 year  risk for CAD , nutrition counseling,  colorectal cancer screening, and recommended immunizations.  Printed recommendations for health maintenance screenings was given.       S/P prostatectomy   Sensory neuropathy ([facility])    Acquired, secondary to surgery.  Discussed trial of Lyrica for control of pain given side effects of  gabapentin. rx given.       Relevant Medications   mirtazapine (REMERON) 15 MG tablet   pregabalin (LYRICA) 50 MG capsule    Other Visit Diagnoses    Vision, loss, sudden, unspecified laterality    -  Primary    Decreased peripheral vision of left eye        Relevant Orders    Ambulatory referral to Ophthalmology    Vitamin D deficiency        Relevant Orders    VITAMIN D 25 Hydroxy (Vit-D Deficiency, Fractures) (Completed)    Hyperlipidemia        Relevant Orders    Lipid panel (Completed)    Other fatigue        Relevant Orders    Comprehensive metabolic panel (Completed)    Magnesium (Completed)    TSH (Completed)  CBC with Differential/Platelet (Completed)       I have changed Mr. Brasher's dexlansoprazole and albuterol. I am also having him start on pregabalin and Clindamycin Phosphate. Additionally, I am having him maintain his carvedilol, desonide, hydrocortisone-pramoxine, gabapentin, Clindamycin Phosphate, traMADol, Vitamin D3, polyethylene glycol, docusate sodium, Probiotic Product (PROBIOTIC & ACIDOPHILUS EX ST PO), dicyclomine, and mirtazapine.  Meds ordered this encounter  Medications  . Cholecalciferol (VITAMIN D3) 10000 units TABS    Sig: Take 1 tablet by mouth daily.  . polyethylene glycol (MIRALAX / GLYCOLAX) packet    Sig: Take 17 g by mouth daily.  Marland Kitchen docusate sodium (COLACE) 100 MG capsule    Sig: Take 100 mg by mouth 2 (two) times daily.  . Probiotic Product (PROBIOTIC & ACIDOPHILUS EX ST PO)    Sig: Take 2 tablets by mouth at bedtime as needed.  Marland Kitchen DISCONTD: albuterol (PROAIR HFA) 108 (90 Base) MCG/ACT inhaler    Sig: Inhale into the lungs.  Marland Kitchen dexlansoprazole (DEXILANT) 60 MG capsule    Sig: Take 1 capsule (60 mg total) by mouth daily.    Dispense:  30 capsule    Refill:  2  . dicyclomine (BENTYL) 10 MG capsule    Sig: Take 1 capsule (10 mg total) by mouth 2 (two) times daily.    Dispense:  60 capsule    Refill:  2  . mirtazapine (REMERON) 15 MG  tablet    Sig: Take 1 tablet (15 mg total) by mouth at bedtime.    Dispense:  30 tablet    Refill:  2  . pregabalin (LYRICA) 50 MG capsule    Sig: Take 1 capsule (50 mg total) by mouth 3 (three) times daily.    Dispense:  90 capsule    Refill:  1  . Clindamycin Phosphate foam    Sig: Apply one pump daily as needed    Dispense:  50 g    Refill:  5  . albuterol (PROAIR HFA) 108 (90 Base) MCG/ACT inhaler    Sig: Inhale 2 puffs into the lungs every 6 (six) hours as needed for wheezing or shortness of breath.    Dispense:  6.7 g    Refill:  3    KEEP ON FILE FOR FUTURE REFILLS    Medications Discontinued During This Encounter  Medication Reason  . dexlansoprazole (DEXILANT) 60 MG capsule Reorder  . dicyclomine (BENTYL) 10 MG capsule Reorder  . mirtazapine (REMERON) 15 MG tablet Reorder  . albuterol (PROAIR HFA) 108 (90 Base) MCG/ACT inhaler Reorder    Follow-up: No Follow-up on file.   Crecencio Mc, Tony

## 2015-10-16 ENCOUNTER — Encounter: Payer: Self-pay | Admitting: Internal Medicine

## 2015-10-16 ENCOUNTER — Encounter: Payer: Self-pay | Admitting: *Deleted

## 2015-10-16 NOTE — Addendum Note (Signed)
Addended by: Kerin Salen R on: 10/16/2015 10:02 AM   Modules accepted: Miquel Dunn

## 2015-11-06 ENCOUNTER — Telehealth: Payer: Self-pay | Admitting: Radiology

## 2015-11-06 NOTE — Telephone Encounter (Signed)
Patient states that he has had malaise, but he has not had a fever, no malodorous urine and left side pain.  His urine is clear.  He is seeing his urologist in [place] on Monday.  He will push fluids and call back tomorrow if he is still having symptoms.

## 2015-11-06 NOTE — Telephone Encounter (Signed)
Pt requests a return call to [phone] regarding possible kidney infection. Pt did not want to speak with triage nurse.

## 2015-11-20 ENCOUNTER — Ambulatory Visit
Admission: RE | Admit: 2015-11-20 | Discharge: 2015-11-20 | Disposition: A | Discharge status: Home / Self Care | Payer: Medicare Other | Source: Ambulatory Visit | Attending: Ophthalmology | Admitting: Ophthalmology

## 2015-11-20 ENCOUNTER — Ambulatory Visit: Payer: Medicare Other | Admitting: Anesthesiology

## 2015-11-20 ENCOUNTER — Encounter: Payer: Self-pay | Admitting: Anesthesiology

## 2015-11-20 ENCOUNTER — Encounter
Admission: RE | Disposition: A | Discharge status: Home / Self Care | Payer: Self-pay | Source: Ambulatory Visit | Attending: Ophthalmology

## 2015-11-20 DIAGNOSIS — H9190 Unspecified hearing loss, unspecified ear: Secondary | ICD-10-CM | POA: Diagnosis not present

## 2015-11-20 DIAGNOSIS — Z87891 Personal history of nicotine dependence: Secondary | ICD-10-CM | POA: Diagnosis not present

## 2015-11-20 DIAGNOSIS — H2512 Age-related nuclear cataract, left eye: Secondary | ICD-10-CM | POA: Diagnosis present

## 2015-11-20 DIAGNOSIS — G709 Myoneural disorder, unspecified: Secondary | ICD-10-CM | POA: Insufficient documentation

## 2015-11-20 DIAGNOSIS — Z8551 Personal history of malignant neoplasm of bladder: Secondary | ICD-10-CM | POA: Insufficient documentation

## 2015-11-20 DIAGNOSIS — F329 Major depressive disorder, single episode, unspecified: Secondary | ICD-10-CM | POA: Diagnosis not present

## 2015-11-20 DIAGNOSIS — I10 Essential (primary) hypertension: Secondary | ICD-10-CM | POA: Insufficient documentation

## 2015-11-20 DIAGNOSIS — Z885 Allergy status to narcotic agent status: Secondary | ICD-10-CM | POA: Diagnosis not present

## 2015-11-20 DIAGNOSIS — K219 Gastro-esophageal reflux disease without esophagitis: Secondary | ICD-10-CM | POA: Diagnosis not present

## 2015-11-20 HISTORY — PX: CATARACT EXTRACTION W/PHACO: SHX586

## 2015-11-20 SURGERY — PHACOEMULSIFICATION, CATARACT, WITH IOL INSERTION
Anesthesia: Monitor Anesthesia Care | Site: Eye | Laterality: Left | Wound class: Clean

## 2015-11-20 MED ORDER — TETRACAINE HCL 0.5 % OP SOLN
1.0000 [drp] | Freq: Once | OPHTHALMIC | Status: AC
  Administered 2015-11-20: 1 [drp] via OPHTHALMIC

## 2015-11-20 MED ORDER — POVIDONE-IODINE 5 % OP SOLN
OPHTHALMIC | Status: AC
  Filled 2015-11-20: qty 30

## 2015-11-20 MED ORDER — MOXIFLOXACIN HCL 0.5 % OP SOLN
OPHTHALMIC | Status: DC | PRN
  Administered 2015-11-20: 1 [drp] via OPHTHALMIC

## 2015-11-20 MED ORDER — TETRACAINE HCL 0.5 % OP SOLN
OPHTHALMIC | Status: AC
  Filled 2015-11-20: qty 2

## 2015-11-20 MED ORDER — EPINEPHRINE HCL 1 MG/ML IJ SOLN
INTRAOCULAR | Status: DC | PRN
  Administered 2015-11-20: 1 mL via OPHTHALMIC

## 2015-11-20 MED ORDER — MOXIFLOXACIN HCL 0.5 % OP SOLN
OPHTHALMIC | Status: DC
Start: 2015-11-20 — End: 2015-11-20
  Filled 2015-11-20: qty 3

## 2015-11-20 MED ORDER — CEFUROXIME OPHTHALMIC INJECTION 1 MG/0.1 ML
INJECTION | OPHTHALMIC | Status: AC
  Filled 2015-11-20: qty 0.1

## 2015-11-20 MED ORDER — EPINEPHRINE HCL 1 MG/ML IJ SOLN
INTRAMUSCULAR | Status: AC
  Filled 2015-11-20: qty 2

## 2015-11-20 MED ORDER — CEFUROXIME OPHTHALMIC INJECTION 1 MG/0.1 ML
INJECTION | OPHTHALMIC | Status: DC | PRN
  Administered 2015-11-20: .1 mL via INTRACAMERAL

## 2015-11-20 MED ORDER — FENTANYL CITRATE (PF) 100 MCG/2ML IJ SOLN
INTRAMUSCULAR | Status: DC | PRN
  Administered 2015-11-20: 50 ug via INTRAVENOUS

## 2015-11-20 MED ORDER — MIDAZOLAM HCL 2 MG/2ML IJ SOLN
INTRAMUSCULAR | Status: DC | PRN
  Administered 2015-11-20: 1 mg via INTRAVENOUS

## 2015-11-20 MED ORDER — ARMC OPHTHALMIC DILATING GEL
1.0000 "application " | OPHTHALMIC | Status: DC | PRN
  Administered 2015-11-20 (×2): 1 via OPHTHALMIC

## 2015-11-20 MED ORDER — LIDOCAINE HCL (PF) 4 % IJ SOLN
INTRAOCULAR | Status: DC | PRN
  Administered 2015-11-20: .5 mL via OPHTHALMIC

## 2015-11-20 MED ORDER — CARBACHOL 0.01 % IO SOLN
INTRAOCULAR | Status: DC | PRN
  Administered 2015-11-20: .5 mL via INTRAOCULAR

## 2015-11-20 MED ORDER — MOXIFLOXACIN HCL 0.5 % OP SOLN: 1.0000 [drp] | OPHTHALMIC | Status: DC | PRN

## 2015-11-20 MED ORDER — NA CHONDROIT SULF-NA HYALURON 40-17 MG/ML IO SOLN
INTRAOCULAR | Status: AC
  Filled 2015-11-20: qty 1

## 2015-11-20 MED ORDER — NA CHONDROIT SULF-NA HYALURON 40-17 MG/ML IO SOLN
INTRAOCULAR | Status: DC | PRN
  Administered 2015-11-20: 1 mL via INTRAOCULAR

## 2015-11-20 MED ORDER — SODIUM CHLORIDE 0.9 % IV SOLN
INTRAVENOUS | Status: DC
  Administered 2015-11-20: 07:00:00 via INTRAVENOUS

## 2015-11-20 MED ORDER — POVIDONE-IODINE 5 % OP SOLN
1.0000 "application " | Freq: Once | OPHTHALMIC | Status: AC
  Administered 2015-11-20: 1 via OPHTHALMIC

## 2015-11-20 SURGICAL SUPPLY — 21 items
CANNULA ANT/CHMB 27GA (MISCELLANEOUS) ×2 IMPLANT
CUP MEDICINE 2OZ PLAST GRAD ST (MISCELLANEOUS) ×2 IMPLANT
GLOVE BIO SURGEON STRL SZ8 (GLOVE) ×2 IMPLANT
GLOVE BIOGEL M 6.5 STRL (GLOVE) ×2 IMPLANT
GLOVE SURG LX 8.0 MICRO (GLOVE) ×1
GLOVE SURG LX STRL 8.0 MICRO (GLOVE) ×1 IMPLANT
GOWN STRL REUS W/ TWL LRG LVL3 (GOWN DISPOSABLE) ×2 IMPLANT
GOWN STRL REUS W/TWL LRG LVL3 (GOWN DISPOSABLE) ×2
LENS IOL TECNIS ITEC 20.0 (Intraocular Lens) ×2 IMPLANT
PACK CATARACT (MISCELLANEOUS) ×2 IMPLANT
PACK CATARACT BRASINGTON LX (MISCELLANEOUS) ×2 IMPLANT
PACK EYE AFTER SURG (MISCELLANEOUS) ×2 IMPLANT
SOL BSS BAG (MISCELLANEOUS) ×2
SOL PREP PVP 2OZ (MISCELLANEOUS) ×2
SOLUTION BSS BAG (MISCELLANEOUS) ×1 IMPLANT
SOLUTION PREP PVP 2OZ (MISCELLANEOUS) ×1 IMPLANT
SYR 3ML LL SCALE MARK (SYRINGE) ×2 IMPLANT
SYR 5ML LL (SYRINGE) ×2 IMPLANT
SYR TB 1ML 27GX1/2 LL (SYRINGE) ×2 IMPLANT
WATER STERILE IRR 1000ML POUR (IV SOLUTION) ×2 IMPLANT
WIPE NON LINTING 3.25X3.25 (MISCELLANEOUS) ×2 IMPLANT

## 2015-11-20 NOTE — Op Note (Signed)
PREOPERATIVE DIAGNOSIS:  Nuclear sclerotic cataract of the left eye.   POSTOPERATIVE DIAGNOSIS:  nuclear sclerotic cataract left eye   OPERATIVE PROCEDURE:  Procedure(s): CATARACT EXTRACTION PHACO AND INTRAOCULAR LENS PLACEMENT (IOC)   SURGEON:  Birder Robson, MD.   ANESTHESIA:   Anesthesiologist: Gunnar Bulla, MD CRNA: Lance Muss, CRNA  1.      Managed anesthesia care. 2.      Topical tetracaine drops followed by 2% Xylocaine jelly applied in the preoperative holding area.       3.   0.2 ml of epi-Shugarcaine was  placed in the anterior chamber following the paracentesis.    COMPLICATIONS:  None.   TECHNIQUE:   Stop and chop   DESCRIPTION OF PROCEDURE:  The patient was examined and consented in the preoperative holding area where the aforementioned topical anesthesia was applied to the left eye and then brought back to the Operating Room where the left eye was prepped and draped in the usual sterile ophthalmic fashion and a lid speculum was placed. A paracentesis was created with the side port blade and the anterior chamber was filled with viscoelastic. A near clear corneal incision was performed with the steel keratome. A continuous curvilinear capsulorrhexis was performed with a cystotome followed by the capsulorrhexis forceps. Hydrodissection and hydrodelineation were carried out with BSS on a blunt cannula. The lens was removed in a stop and chop  technique and the remaining cortical material was removed with the irrigation-aspiration handpiece. The capsular bag was inflated with viscoelastic and the Technis [id number] lens was placed in the capsular bag without complication. The remaining viscoelastic was removed from the eye with the irrigation-aspiration handpiece. The wounds were hydrated. The anterior chamber was flushed with Miostat and the eye was inflated to physiologic pressure. 0.1 mL of cefuroxime concentration 10 mg/mL was placed in the anterior chamber. The wounds were found  to be water tight. The eye was dressed with Vigamox. The patient was given protective glasses to wear throughout the day and a shield with which to sleep tonight. The patient was also given drops with which to begin a drop regimen today and will follow-up with me in one day.  Implant Name Type Inv. Item Serial No. Manufacturer Lot No. LRB No. Used  LENS IOL DIOP 20.0 - [id number] Intraocular Lens LENS IOL DIOP 20.0 [id number]   Left 1   Procedure(s) with comments: CATARACT EXTRACTION PHACO AND INTRAOCULAR LENS PLACEMENT (IOC) (Left) - [place] AP% CDE fluid pack lot # [id number]  Electronically signed: [place] 11/20/2015 7:48 AM

## 2015-11-20 NOTE — Anesthesia Preprocedure Evaluation (Signed)
Anesthesia Evaluation  Patient identified by MRN, date of birth, ID band Patient awake    Reviewed: Allergy & Precautions, NPO status , Patient's Chart, lab work & pertinent test results, reviewed documented beta blocker date and time   Airway Mallampati: II  TM Distance: >3 FB     Dental  (+) Chipped   Pulmonary former smoker,           Cardiovascular hypertension, Pt. on medications and Pt. on home beta blockers      Neuro/Psych PSYCHIATRIC DISORDERS Depression  Neuromuscular disease    GI/Hepatic GERD  Controlled,  Endo/Other    Renal/GU      Musculoskeletal   Abdominal   Peds  Hematology   Anesthesia Other Findings   Reproductive/Obstetrics                             Anesthesia Physical Anesthesia Plan  ASA: III  Anesthesia Plan: MAC   Post-op Pain Management:    Induction:   Airway Management Planned:   Additional Equipment:   Intra-op Plan:   Post-operative Plan:   Informed Consent: I have reviewed the patients History and Physical, chart, labs and discussed the procedure including the risks, benefits and alternatives for the proposed anesthesia with the patient or authorized representative who has indicated his/her understanding and acceptance.     Plan Discussed with: CRNA  Anesthesia Plan Comments:         Anesthesia Quick Evaluation

## 2015-11-20 NOTE — H&P (Signed)
  All labs reviewed. Abnormal studies sent to patients PCP when indicated.  Previous H&P reviewed, patient examined, there are NO CHANGES.  [name] LOUIS7/31/20177:16 AM

## 2015-11-20 NOTE — Discharge Instructions (Signed)
Eye Surgery Discharge Instructions  Expect mild scratchy sensation or mild soreness. DO NOT RUB YOUR EYE!  The day of surgery:  Minimal physical activity, but bed rest is not required  No reading, computer work, or close hand work  No bending, lifting, or straining.  May watch TV  For 24 hours:  No driving, legal decisions, or alcoholic beverages  Safety precautions  Eat anything you prefer: It is better to start with liquids, then soup then solid foods.  _____ Eye patch should be worn until postoperative exam tomorrow.  ____ Solar shield eyeglasses should be worn for comfort in the sunlight/patch while sleeping  Resume all regular medications including aspirin or Coumadin if these were discontinued prior to surgery. You may shower, bathe, shave, or wash your hair. Tylenol may be taken for mild discomfort.  Call your doctor if you experience significant pain, nausea, or vomiting, fever > 101 or other signs of infection. [phone] or [phone] Specific instructions:  Follow-up Information    Tim Lair, MD .   Specialty:  Ophthalmology Why:  3:45 today Contact information: [address] [place] [address] [phone]          Eye Surgery Discharge Instructions  Expect mild scratchy sensation or mild soreness. DO NOT RUB YOUR EYE!  The day of surgery:  Minimal physical activity, but bed rest is not required  No reading, computer work, or close hand work  No bending, lifting, or straining.  May watch TV  For 24 hours:  No driving, legal decisions, or alcoholic beverages  Safety precautions  Eat anything you prefer: It is better to start with liquids, then soup then solid foods.  _____ Eye patch should be worn until postoperative exam tomorrow.  ____ Solar shield eyeglasses should be worn for comfort in the sunlight/patch while sleeping  Resume all regular medications including aspirin or Coumadin if these were  discontinued prior to surgery. You may shower, bathe, shave, or wash your hair. Tylenol may be taken for mild discomfort.  Call your doctor if you experience significant pain, nausea, or vomiting, fever > 101 or other signs of infection. [phone] or [phone] Specific instructions:  Follow-up Information    Tim Lair, MD .   Specialty:  Ophthalmology Why:  3:45 today Contact information: [address] [place] [address] [phone]

## 2015-11-20 NOTE — Transfer of Care (Signed)
Immediate Anesthesia Transfer of Care Note  Patient: Tony Cox  Procedure(s) Performed: Procedure(s) with comments: CATARACT EXTRACTION PHACO AND INTRAOCULAR LENS PLACEMENT (IOC) (Left) - [place] AP% CDE fluid pack lot # [id number]  Patient Location: PACU  Anesthesia Type:MAC  Level of Consciousness: awake, alert  and oriented  Airway & Oxygen Therapy: Patient Spontanous Breathing  Post-op Assessment: Report given to RN and Post -op Vital signs reviewed and stable  Post vital signs: Reviewed and stable  Last Vitals:  Vitals:   11/20/15 0614 11/20/15 0751  BP: (!) 143/80 120/81  Pulse: (!) 59 (!) 57  Resp: 16 12  Temp: (!) 35.6 C     Last Pain:  Vitals:   11/20/15 0614  TempSrc: Tympanic         Complications: No apparent anesthesia complications

## 2015-11-20 NOTE — OR Nursing (Signed)
Iv left hand - p ox left mid finger, ecg leads/shoe covers in place

## 2015-11-20 NOTE — Anesthesia Procedure Notes (Signed)
Procedure Name: MAC Performed by: Lance Muss Pre-anesthesia Checklist: Patient identified, Emergency Drugs available, Suction available, Timeout performed and Patient being monitored Patient Re-evaluated:Patient Re-evaluated prior to inductionOxygen Delivery Method: Nasal cannula

## 2015-11-20 NOTE — Anesthesia Postprocedure Evaluation (Signed)
Anesthesia Post Note  Patient: Tony Cox  Procedure(s) Performed: Procedure(s) (LRB): CATARACT EXTRACTION PHACO AND INTRAOCULAR LENS PLACEMENT (IOC) (Left)  Patient location during evaluation: PACU Anesthesia Type: MAC Level of consciousness: awake and alert, awake and oriented Pain management: pain level controlled Vital Signs Assessment: post-procedure vital signs reviewed and stable Respiratory status: spontaneous breathing, nonlabored ventilation and respiratory function stable Cardiovascular status: stable Anesthetic complications: no    Last Vitals:  Vitals:   11/20/15 0614 11/20/15 0751  BP: (!) 143/80 120/81  Pulse: (!) 59 (!) 57  Resp: 16 12  Temp: (!) 35.6 C     Last Pain:  Vitals:   11/20/15 0614  TempSrc: Tympanic                 Lance Muss

## 2015-11-24 ENCOUNTER — Telehealth: Payer: Self-pay | Admitting: Urology

## 2015-12-12 ENCOUNTER — Other Ambulatory Visit: Payer: Self-pay | Admitting: Internal Medicine

## 2016-01-07 ENCOUNTER — Other Ambulatory Visit: Payer: Self-pay | Admitting: Urology

## 2016-01-07 ENCOUNTER — Encounter: Payer: Self-pay | Admitting: Urology

## 2016-01-09 ENCOUNTER — Encounter: Payer: Self-pay | Admitting: Urology

## 2016-01-19 ENCOUNTER — Ambulatory Visit (INDEPENDENT_AMBULATORY_CARE_PROVIDER_SITE_OTHER): Payer: Medicare Other | Admitting: Urology

## 2016-01-19 ENCOUNTER — Encounter: Payer: Self-pay | Admitting: Urology

## 2016-01-19 VITALS — BP 164/88 | HR 56 | Ht 66.0 in | Wt 147.4 lb

## 2016-01-19 DIAGNOSIS — K439 Ventral hernia without obstruction or gangrene: Secondary | ICD-10-CM | POA: Diagnosis not present

## 2016-01-19 DIAGNOSIS — C679 Malignant neoplasm of bladder, unspecified: Secondary | ICD-10-CM | POA: Diagnosis not present

## 2016-01-19 DIAGNOSIS — C61 Malignant neoplasm of prostate: Secondary | ICD-10-CM | POA: Diagnosis not present

## 2016-01-19 MED ORDER — GABAPENTIN 300 MG PO CAPS: 300.0000 mg | ORAL_CAPSULE | Freq: Three times a day (TID) | ORAL | 12 refills | Status: DC

## 2016-01-27 NOTE — Progress Notes (Signed)
01/19/2016 1:46 PM   [name] [birth year] [id number]  Referring provider: Crecencio Mc, MD [place], [place] [address]  Chief Complaint  Patient presents with  . Follow-up    HPI: Mr. Sagona is a 65 year old [place] male who is status post robotic assisted cystectomy/prostatectomy with ileo conduit diversion performed by Dr. Ardath Sax the on 01/28/2013 for muscle invasive bladder cancer status post neoadjuvant it chemotherapy who presents today for follow up visit.    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    He has had one urinary tract infection positive for enterococcus and Escherichia coli  on 10/01/2014.   He does have stoma pain associated with a hernia for which he uses a compression belt and takes gabapentin and tramadol.  The belt can become uncomfortable when worn for long periods of time.  He also have significant discomfort when he is standing for long periods of time.  He has not had recent fevers, chills, nausea or vomiting.    He is not having difficulty with the stroma or foul drainage.  He has recently had a series of urethral washing which have returned atypical urothelial cells, suspicious cells and more recently atypical urothelial cells on 01/18/2016.  He has been on a protocol of imaging every three months (patient driven) with Dr. Darcel Bayley.  There has not been evidence of reoccurrence or metastasis.       PMH: Past Medical History:  Diagnosis Date  . Cancer ([place]) 2015   T2 uroepithelial bladder carcinoma   . Depression   . GERD (gastroesophageal reflux disease)   . Heart murmur   . History of atrial fibrillation without current medication    POST OP MITRIAL VALVE REPLACEMENT IN 2004  . 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 Hyperlipidemia   . Hypertension     Surgical History: Past Surgical History:  Procedure Laterality Date  . CARDIAC CATHETERIZATION    . CATARACT EXTRACTION W/PHACO Left 11/20/2015   Procedure: CATARACT EXTRACTION PHACO AND INTRAOCULAR LENS PLACEMENT (IOC);  Surgeon: Birder Robson, MD;  Location: [facility] ORS;  Service: Ophthalmology;  Laterality: Left;  [place] AP% CDE fluid pack lot # [id number]  . CHOLECYSTECTOMY  2011  . COLONOSCOPY  2009, 2013   Dr Vassie Moment ADENOMA   . cystectomy  2014   ileal conduit diversion   . HERNIA REPAIR  6812   umbilical   . TONSILLECTOMY  AS CHILD  . TRANSTHORACIC ECHOCARDIOGRAM  11-03-2008  DR FATH [name])   LVF NORMAL/ EF 55-60%/ LEFT ATRIAL ENLARGEMENT/  ADEQUATELY FUNCTIONING MITRAL VALVE REPAIR/  MILD MR & TRI/  NO SIGNIFICANT CHANGE FROM PREVIOUS ECHO  . TRANSURETHRAL RESECTION OF BLADDER TUMOR N/A 07/27/2012   Procedure: TRANSURETHRAL RESECTION OF BLADDER TUMOR (TURBT);  Surgeon: Franchot Gallo, MD;  Location: [facility];  Service: Urology;  Laterality: N/A;  WITH MITOMYCIN INSTILLATION   . valvuloplasty with replacement of cosgrove ring,1 congenital ruptur of cordi  2004   [facility]   mitral valve     Home Medications:    Medication List       Accurate as of 01/19/16 11:59 PM. Always use your most recent med list.  carvedilol 10 MG 24 hr capsule Commonly known as:  COREG CR Take 10 mg by mouth every evening.   Clindamycin Phosphate foam APPLY 1 GRAM ON SKIN DAILY   desonide 0.05 % lotion Commonly known as:  DESOWEN   dexlansoprazole 60 MG capsule Commonly known as:  DEXILANT Take 1 capsule (60 mg total) by mouth daily.   dicyclomine 10 MG capsule Commonly known as:  BENTYL TAKE 1 CAPSULE BY MOUTH TWICE DAILY   gabapentin 300 MG capsule Commonly known as:  NEURONTIN Take 1 capsule (300 mg total) by mouth  3 (three) times daily.   hydrocortisone-pramoxine 2.5-1 % rectal cream Commonly known as:  ANALPRAM-HC APPLY A SMALL AMOUNT TWICE  A DAY AS NEEDED   mirtazapine 15 MG tablet Commonly known as:  REMERON Take 1 tablet (15 mg total) by mouth at bedtime.   polyethylene glycol packet Commonly known as:  MIRALAX / GLYCOLAX Take 17 g by mouth daily.   PROBIOTIC & ACIDOPHILUS EX ST PO Take 2 tablets by mouth at bedtime as needed.   traMADol 50 MG tablet Commonly known as:  ULTRAM Take 1 tablet (50 mg total) by mouth every 6 (six) hours as needed.   Vitamin D3 10000 units Tabs Take 1 tablet by mouth daily.       Allergies:  Allergies  Allergen Reactions  . Codeine Nausea And Vomiting and Hives    Family History: Family History  Problem Relation Age of Onset  . Adopted: Yes  . Colon cancer Mother   . Cancer Neg Hx     Prostate,Kidney,Bladder    Social History:  reports that he has quit smoking. He has never used smokeless tobacco. He reports that he drinks about 0.6 oz of alcohol per week . He reports that he does not use drugs.  ROS: Urological Symptom Review  Patient is experiencing the following symptoms: Urinary tract infection   Review of Systems  Gastrointestinal (upper)  : Negative for upper GI symptoms  Gastrointestinal (lower) : Negative for lower GI symptoms  Constitutional : Negative for symptoms  Skin: Negative for skin symptoms  Eyes: Negative for eye symptoms  Ear/Nose/Throat : Negative for Ear/Nose/Throat symptoms  Hematologic/Lymphatic: Negative for Hematologic/Lymphatic symptoms  Cardiovascular : Negative for cardiovascular symptoms  Respiratory : Negative for respiratory symptoms  Endocrine: Negative for endocrine symptoms  Musculoskeletal: Negative for musculoskeletal symptoms  Neurological: Negative for neurological symptoms  Psychologic: Negative for psychiatric symptoms   Physical Exam: BP (!) 164/88   Pulse  (!) 56   Ht 5' 6"  (1.676 m)   Wt 147 lb 6.4 oz (66.9 kg)   BMI 23.79 kg/m   Constitutional: Well nourished. Alert and oriented, No acute distress. HEENT: [place] AT, moist mucus membranes. Trachea midline, no masses. Cardiovascular: No clubbing, cyanosis, or edema. Respiratory: Normal respiratory effort, no increased work of breathing. GI: Abdomen is soft, non tender, non distended, no abdominal masses. Liver and spleen not palpable.  Abdominal hernia appreciated.  Stool sample for occult testing is not indicated.  Stoma located in the right lower quadrant.  No rash.  It is pink and healthy.  Urine in drainage bag is clear yellow.   GU: No CVA tenderness.  No bladder fullness or masses.   Skin: No rashes, bruises or suspicious lesions. Lymph: No cervical or inguinal adenopathy. Neurologic: Grossly intact, no focal deficits, moving all 4 extremities. Psychiatric: Normal mood and affect.  Laboratory Data: Results for orders placed or performed in visit on 10/13/15  Comprehensive  metabolic panel  Result Value Ref Range   Sodium 140 135 - 146 mmol/L   Potassium 4.2 3.5 - 5.3 mmol/L   Chloride 103 98 - 110 mmol/L   CO2 25 20 - 31 mmol/L   Glucose, Bld 98 65 - 99 mg/dL   BUN 14 7 - 25 mg/dL   Creat 0.95 0.70 - 1.25 mg/dL   Total Bilirubin 0.6 0.2 - 1.2 mg/dL   Alkaline Phosphatase 68 40 - 115 U/L   AST 16 10 - 35 U/L   ALT 17 9 - 46 U/L   Total Protein 6.9 6.1 - 8.1 g/dL   Albumin 4.6 3.6 - 5.1 g/dL   Calcium 9.6 8.6 - 10.3 mg/dL  VITAMIN D 25 Hydroxy (Vit-D Deficiency, Fractures)  Result Value Ref Range   Vit D, 25-Hydroxy 38 30 - 100 ng/mL  Magnesium  Result Value Ref Range   Magnesium 2.0 1.5 - 2.5 mg/dL  TSH  Result Value Ref Range   TSH 1.20 0.40 - 4.50 mIU/L  CBC with Differential/Platelet  Result Value Ref Range   WBC 5.4 3.8 - 10.8 K/uL   RBC 4.96 4.20 - 5.80 MIL/uL   Hemoglobin 15.4 13.2 - 17.1 g/dL   HCT 45.9 38.5 - 50.0 %   MCV 92.5 80.0 - 100.0 fL   MCH 31.0 27.0  - 33.0 pg   MCHC 33.6 32.0 - 36.0 g/dL   RDW 13.2 11.0 - 15.0 %   Platelets 176 140 - 400 K/uL   MPV 9.8 7.5 - 12.5 fL   Neutro Abs 3,024 1,500 - 7,800 cells/uL   Lymphs Abs 1,620 850 - 3,900 cells/uL   Monocytes Absolute 594 200 - 950 cells/uL   Eosinophils Absolute 108 15 - 500 cells/uL   Basophils Absolute 54 0 - 200 cells/uL   Neutrophils Relative % 56 %   Lymphocytes Relative 30 %   Monocytes Relative 11 %   Eosinophils Relative 2 %   Basophils Relative 1 %   Smear Review Criteria for review not met   Lipid panel  Result Value Ref Range   Cholesterol 195 125 - 200 mg/dL   Triglycerides 112 <150 mg/dL   HDL 63 >=40 mg/dL   Total CHOL/HDL Ratio 3.1 <=5.0 Ratio   VLDL 22 <30 mg/dL   LDL Cholesterol 110 <130 mg/dL   Lab Results  Component Value Date   WBC 5.4 10/13/2015   HGB 15.4 10/13/2015   HCT 45.9 10/13/2015   MCV 92.5 10/13/2015   PLT 176 10/13/2015    Lab Results  Component Value Date   CREATININE 0.95 10/13/2015    Lab Results  Component Value Date   PSA 2.1 10/19/2012   PSA 3.16 05/31/2011   PSA 1.80 10/09/2009    Lab Results  Component Value Date   TESTOSTERONE 489.65 05/31/2011     Pertinent Imaging:  Assessment & Plan:   1. Bladder cancer:  Patient is status post robotic assisted cystectomy/prostatectomy with ileo conduit diversion performed by Dr. Ardath Sax the on 01/28/2013.  He is being followed at [facility] in [place].    2. Prostate cancer:   Patient is status post robotic assisted cystectomy/prostatectomy with ileo conduit diversion performed by Dr. Ardath Sax the on 01/28/2013.  He is being followed at [facility] in [place].    3. Stoma pain/hernia  - continue gabapentin 300 mg, one capsule tid - refills given  - continue tramadol 50 mg, on tablet q 6 hours for  pain - no refills needed at this time  Return in about 1 year (around 01/18/2017) for follow up.  Zara Council, [facility] [address] [place], [place] [address] [phone]

## 2016-02-12 ENCOUNTER — Other Ambulatory Visit: Payer: Self-pay | Admitting: Urology

## 2016-02-12 ENCOUNTER — Other Ambulatory Visit: Payer: Self-pay | Admitting: Internal Medicine

## 2016-02-12 MED ORDER — LEVOFLOXACIN 500 MG PO TABS: 500.0000 mg | ORAL_TABLET | Freq: Every day | ORAL | 0 refills | Status: DC

## 2016-02-12 NOTE — Progress Notes (Signed)
Patient called and stated that he has having chills.  I have sent Levaquin 500 mg to [facility] pharmacy. He will come in for an UA.

## 2016-02-13 ENCOUNTER — Other Ambulatory Visit: Payer: Medicare Other

## 2016-02-13 DIAGNOSIS — N390 Urinary tract infection, site not specified: Secondary | ICD-10-CM

## 2016-02-14 LAB — URINALYSIS, COMPLETE
Bilirubin, UA: NEGATIVE
Glucose, UA: NEGATIVE
Ketones, UA: NEGATIVE
Leukocytes, UA: NEGATIVE
Nitrite, UA: NEGATIVE
Protein, UA: NEGATIVE
RBC, UA: NEGATIVE
Specific Gravity, UA: <1.005 — ABNORMAL LOW (ref 1.005–1.030)
Urobilinogen, Ur: 0.2 mg/dL (ref 0.2–1.0)
pH, UA: 6.5 (ref 5.0–7.5)

## 2016-02-14 LAB — MICROSCOPIC EXAMINATION
Bacteria, UA: NONE SEEN
Epithelial Cells (non renal): NONE SEEN /hpf (ref 0–10)

## 2016-02-16 ENCOUNTER — Telehealth: Payer: Self-pay

## 2016-02-16 DIAGNOSIS — N390 Urinary tract infection, site not specified: Secondary | ICD-10-CM

## 2016-02-16 LAB — CULTURE, URINE COMPREHENSIVE

## 2016-02-16 MED ORDER — NITROFURANTOIN MONOHYD MACRO 100 MG PO CAPS: 100.0000 mg | ORAL_CAPSULE | Freq: Two times a day (BID) | ORAL | 0 refills | Status: AC

## 2016-02-16 NOTE — Telephone Encounter (Signed)
Spoke with pt in reference to ucx results. Pt elected to have macrobid. Medication sent to pharmacy. Pt requested a phone call from [facility].

## 2016-02-16 NOTE — Telephone Encounter (Signed)
I have spoken with the patient and have answered his questions.

## 2016-02-16 NOTE — Telephone Encounter (Signed)
-----   Message from Nori Riis, PA-C sent at 02/16/2016  1:49 PM EDT ----- Please notify the patient that he has a positive urine culture. The Escherichia coli is resistant to the Levaquin he is currently taking. We need to change antibiotics. Would he prefer Augmentin, Macrobid or Septra DS.

## 2016-02-27 ENCOUNTER — Ambulatory Visit (INDEPENDENT_AMBULATORY_CARE_PROVIDER_SITE_OTHER): Payer: Medicare Other

## 2016-02-27 VITALS — BP 136/84 | HR 62 | Temp 97.7°F | Wt 148.2 lb

## 2016-02-27 DIAGNOSIS — N390 Urinary tract infection, site not specified: Secondary | ICD-10-CM

## 2016-02-27 LAB — URINALYSIS, COMPLETE
Bilirubin, UA: NEGATIVE
Glucose, UA: NEGATIVE
Ketones, UA: NEGATIVE
Nitrite, UA: NEGATIVE
Protein, UA: NEGATIVE
Specific Gravity, UA: <1.005 — ABNORMAL LOW (ref 1.005–1.030)
Urobilinogen, Ur: 0.2 mg/dL (ref 0.2–1.0)
pH, UA: 7 (ref 5.0–7.5)

## 2016-02-27 LAB — MICROSCOPIC EXAMINATION
Bacteria, UA: NONE SEEN
Epithelial Cells (non renal): NONE SEEN /hpf (ref 0–10)

## 2016-02-27 NOTE — Progress Notes (Signed)
Pt presented today for repeat u/a and cx. Pt c/o nausea and chills. Pt has been on abx in the last 30 days for a UTI.   Blood pressure 136/84, pulse 62, temperature 97.7 F (36.5 C), weight 148 lb 3.2 oz (67.2 kg).

## 2016-03-01 ENCOUNTER — Telehealth: Payer: Self-pay | Admitting: Urology

## 2016-03-01 ENCOUNTER — Other Ambulatory Visit: Payer: Self-pay | Admitting: Urology

## 2016-03-01 LAB — CULTURE, URINE COMPREHENSIVE

## 2016-03-01 MED ORDER — NITROFURANTOIN MONOHYD MACRO 100 MG PO CAPS: 100.0000 mg | ORAL_CAPSULE | Freq: Two times a day (BID) | ORAL | 0 refills | Status: DC

## 2016-03-04 ENCOUNTER — Ambulatory Visit
Admission: RE | Admit: 2016-03-04 | Discharge: 2016-03-04 | Disposition: A | Discharge status: Home / Self Care | Payer: Medicare Other | Source: Ambulatory Visit | Attending: Unknown Physician Specialty | Admitting: Unknown Physician Specialty

## 2016-03-04 ENCOUNTER — Other Ambulatory Visit: Payer: Self-pay | Admitting: Unknown Physician Specialty

## 2016-03-04 ENCOUNTER — Telehealth: Payer: Self-pay

## 2016-03-04 DIAGNOSIS — R05 Cough: Secondary | ICD-10-CM

## 2016-03-04 DIAGNOSIS — R059 Cough, unspecified: Secondary | ICD-10-CM

## 2016-03-04 NOTE — Telephone Encounter (Signed)
-----   Message from Nori Riis, PA-C sent at 03/01/2016  8:26 AM EST ----- Patient has a +UCx.  They need to start Macrobid 100 mg,  one capsule twice daily for seven days.  They also need to take a probiotic with the antibiotic course.  The dosage is listed below:  L. acidophilus and L. casei (25 x 109 CFU/day for 2 days, then 50 x 109 CFU/day for duration of the antibiotic course)  I have e scribed their prescription to [facility] pharmacy.

## 2016-03-04 NOTE — Telephone Encounter (Signed)
Spoke with pt in reference to +ucx. Pt stated he started macrobid Friday.

## 2016-03-04 NOTE — Telephone Encounter (Signed)
LMOM

## 2016-03-13 ENCOUNTER — Other Ambulatory Visit: Payer: Self-pay | Admitting: Internal Medicine

## 2016-03-18 ENCOUNTER — Other Ambulatory Visit: Payer: Self-pay | Admitting: Urology

## 2016-03-18 DIAGNOSIS — R52 Pain, unspecified: Secondary | ICD-10-CM

## 2016-05-30 ENCOUNTER — Ambulatory Visit (INDEPENDENT_AMBULATORY_CARE_PROVIDER_SITE_OTHER): Payer: Medicare Other | Admitting: General Surgery

## 2016-05-30 ENCOUNTER — Encounter: Payer: Self-pay | Admitting: General Surgery

## 2016-05-30 VITALS — BP 130/70 | HR 56 | Resp 14 | Ht 66.0 in | Wt 148.0 lb

## 2016-05-30 DIAGNOSIS — Z86010 Personal history of colonic polyps: Secondary | ICD-10-CM | POA: Diagnosis not present

## 2016-05-30 MED ORDER — POLYETHYLENE GLYCOL 3350 17 GM/SCOOP PO POWD: ORAL | 0 refills | Status: DC

## 2016-05-30 NOTE — Patient Instructions (Signed)
The patient is aware to call back for any questions or concerns.  Colonoscopy, Adult A colonoscopy is an exam to look at the entire large intestine. During the exam, a lubricated, bendable tube is inserted into the anus and then passed into the rectum, colon, and other parts of the large intestine. A colonoscopy is often done as a part of normal colorectal screening or in response to certain symptoms, such as anemia, persistent diarrhea, abdominal pain, and blood in the stool. The exam can help screen for and diagnose medical problems, including:  Tumors.  Polyps.  Inflammation.  Areas of bleeding. Tell a health care provider about:  Any allergies you have.  All medicines you are taking, including vitamins, herbs, eye drops, creams, and over-the-counter medicines.  Any problems you or family members have had with anesthetic medicines.  Any blood disorders you have.  Any surgeries you have had.  Any medical conditions you have.  Any problems you have had passing stool. What are the risks? Generally, this is a safe procedure. However, problems may occur, including:  Bleeding.  A tear in the intestine.  A reaction to medicines given during the exam.  Infection (rare). What happens before the procedure? Eating and drinking restrictions  Follow instructions from your health care provider about eating and drinking, which may include:  A few days before the procedure - follow a low-fiber diet. Avoid nuts, seeds, dried fruit, raw fruits, and vegetables.  1-3 days before the procedure - follow a clear liquid diet. Drink only clear liquids, such as clear broth or bouillon, black coffee or tea, clear juice, clear soft drinks or sports drinks, gelatin desert, and popsicles. Avoid any liquids that contain red or purple dye.  On the day of the procedure - do not eat or drink anything during the 2 hours before the procedure, or within the time period that your health care provider  recommends. Bowel prep  If you were prescribed an oral bowel prep to clean out your colon:  Take it as told by your health care provider. Starting the day before your procedure, you will need to drink a large amount of medicated liquid. The liquid will cause you to have multiple loose stools until your stool is almost clear or light green.  If your skin or anus gets irritated from diarrhea, you may use these to relieve the irritation:  Medicated wipes, such as adult wet wipes with aloe and vitamin E.  A skin soothing-product like petroleum jelly.  If you vomit while drinking the bowel prep, take a break for up to 60 minutes and then begin the bowel prep again. If vomiting continues and you cannot take the bowel prep without vomiting, call your health care provider. General instructions  Ask your health care provider about changing or stopping your regular medicines. This is especially important if you are taking diabetes medicines or blood thinners.  Plan to have someone take you home from the hospital or clinic. What happens during the procedure?  An IV tube may be inserted into one of your veins.  You will be given medicine to help you relax (sedative).  To reduce your risk of infection:  Your health care team will wash or sanitize their hands.  Your anal area will be washed with soap.  You will be asked to lie on your side with your knees bent.  Your health care provider will lubricate a long, thin, flexible tube. The tube will have a camera and a light  on the end.  The tube will be inserted into your anus.  The tube will be gently eased through your rectum and colon.  Air will be delivered into your colon to keep it open. You may feel some pressure or cramping.  The camera will be used to take images during the procedure.  A small tissue sample may be removed from your body to be examined under a microscope (biopsy). If any potential problems are found, the tissue will  be sent to a lab for testing.  If small polyps are found, your health care provider may remove them and have them checked for cancer cells.  The tube that was inserted into your anus will be slowly removed. The procedure may vary among health care providers and hospitals. What happens after the procedure?  Your blood pressure, heart rate, breathing rate, and blood oxygen level will be monitored until the medicines you were given have worn off.  Do not drive for 24 hours after the exam.  You may have a small amount of blood in your stool.  You may pass gas and have mild abdominal cramping or bloating due to the air that was used to inflate your colon during the exam.  It is up to you to get the results of your procedure. Ask your health care provider, or the department performing the procedure, when your results will be ready. This information is not intended to replace advice given to you by your health care provider. Make sure you discuss any questions you have with your health care provider. Document Released: 04/05/2000 Document Revised: 10/27/2015 Document Reviewed: 06/20/2015 Elsevier Interactive Patient Education  2017 [employer].

## 2016-05-30 NOTE — Progress Notes (Signed)
24fPatient ID: Tony Cox, male   DOB: 02/05/1951, 66 y.o.   MRN: [id number]  Chief Complaint  Patient presents with  . Colonoscopy    HPI Tony Cox is a 66 y.o. male.  Who presents for a colonoscopy discussion. The last colonoscopy was completed in 2013. Denies any gastrointestinal issues. Bowels move regular and no bleeding noted.  He does have a hernia at the urostomy site and wears a urostomy belt.  HPI  Past Medical History:  Diagnosis Date  . Cancer ([place]) 2015   T2 uroepithelial bladder carcinoma   . Depression   . GERD (gastroesophageal reflux disease)   . Heart murmur   . History of atrial fibrillation without current medication    POST OP MITRIAL VALVE REPLACEMENT IN 2004  . 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 Hyperlipidemia   . Hypertension     Past Surgical History:  Procedure Laterality Date  . CARDIAC CATHETERIZATION    . CATARACT EXTRACTION W/PHACO Left 11/20/2015   Procedure: CATARACT EXTRACTION PHACO AND INTRAOCULAR LENS PLACEMENT (IOC);  Surgeon: Birder Robson, MD;  Location: [facility] ORS;  Service: Ophthalmology;  Laterality: Left;  [place] AP% CDE fluid pack lot # [id number]  . CHOLECYSTECTOMY  2011  . COLONOSCOPY  2009, 2013   Dr Vassie Moment ADENOMA   . cystectomy  2014   ileal conduit diversion   . HERNIA REPAIR  [id number]   umbilical   . TONSILLECTOMY  AS CHILD  . TRANSTHORACIC ECHOCARDIOGRAM  11-03-2008  DR FATH [name])   LVF NORMAL/ EF 55-60%/ LEFT ATRIAL ENLARGEMENT/  ADEQUATELY FUNCTIONING MITRAL VALVE REPAIR/  MILD MR & TRI/  NO SIGNIFICANT CHANGE FROM PREVIOUS ECHO  . TRANSURETHRAL RESECTION OF BLADDER TUMOR N/A 07/27/2012   Procedure: TRANSURETHRAL RESECTION OF BLADDER TUMOR (TURBT);  Surgeon: Franchot Gallo, MD;  Location: [facility];  Service: Urology;  Laterality: N/A;  WITH MITOMYCIN  INSTILLATION   . valvuloplasty with replacement of cosgrove ring,1 congenital ruptur of cordi  2004   [facility]   mitral valve     Family History  Problem Relation Age of Onset  . Adopted: Yes  . Colon cancer Mother   . Cancer Neg Hx     Prostate,Kidney,Bladder    Social History Social History  Substance Use Topics  . Smoking status: Former Research scientist (life sciences)  . Smokeless tobacco: Never Used     Comment: quit 2 years  . Alcohol use 0.6 oz/week    1 Standard drinks or equivalent per week     Comment: occasional    Allergies  Allergen Reactions  . Codeine Nausea And Vomiting and Hives    Current Outpatient Prescriptions  Medication Sig Dispense Refill  . carvedilol (COREG CR) 10 MG 24 hr capsule Take 10 mg by mouth every evening.    . Cholecalciferol (VITAMIN D3) 10000 units TABS Take 1 tablet by mouth daily.    . Clindamycin Phosphate foam APPLY 1 GRAM ON SKIN DAILY  5  . desonide (DESOWEN) 0.05 % lotion     . dexlansoprazole (DEXILANT) 60 MG capsule Take 1 capsule (60 mg total) by mouth daily. 30 capsule 2  . dicyclomine (BENTYL) 10 MG capsule TAKE 1 CAPSULE TWICE DAILY 60 capsule 2  . gabapentin (NEURONTIN) 300 MG capsule Take 1 capsule (300 mg total) by mouth 3 (three) times daily. 90 capsule 12  . hydrocortisone-pramoxine (  ANALPRAM-HC) 2.5-1 % rectal cream APPLY A SMALL AMOUNT TWICE  A DAY AS NEEDED 30 g 0  . mirtazapine (REMERON) 15 MG tablet Take 1 tablet (15 mg total) by mouth at bedtime. (Patient taking differently: Take 7.5 mg by mouth at bedtime. ) 30 tablet 2  . polyethylene glycol (MIRALAX / GLYCOLAX) packet Take 17 g by mouth as needed.     . Probiotic Product (PROBIOTIC & ACIDOPHILUS EX ST PO) Take 2 tablets by mouth at bedtime as needed.    . polyethylene glycol powder (GLYCOLAX/MIRALAX) powder 255 grams one bottle for colonoscopy prep 255 g 0   No current facility-administered medications for this visit.     Review of Systems Review of Systems   Constitutional: Negative.   Respiratory: Negative.   Cardiovascular: Negative.   Gastrointestinal: Negative for constipation and diarrhea.    Blood pressure 130/70, pulse (!) 56, resp. rate 14, height 5\' 6"  (1.676 m), weight 148 lb (67.1 kg), SpO2 98 %.  Physical Exam Physical Exam  Constitutional: He is oriented to person, place, and time. He appears well-developed and well-nourished.  HENT:  Mouth/Throat: Oropharynx is clear and moist.  Eyes: Conjunctivae are normal. No scleral icterus.  Neck: Neck supple.  Cardiovascular: Normal rate, regular rhythm and normal heart sounds.   Pulmonary/Chest: Effort normal and breath sounds normal.  Abdominal: Soft.    Lymphadenopathy:    He has no cervical adenopathy.  Neurological: He is alert and oriented to person, place, and time.  Skin: Skin is warm and dry.  Psychiatric: His behavior is normal.    Data Reviewed 06/07/2011 colonoscopy showed 25 mm polyps in the transverse colon.  TRANSVERSE COLON POLYP X 2 SNARED:  - TUBULAR ADENOMA (1).  - HYPERPLASTIC POLYP (1).  - NEGATIVE FOR HIGH GRADE DYSPLASIA AND MALIGNANCY.    Assessment    Candidate for follow-up colonoscopy.    Plan    Colonoscopy with possible biopsy/polypectomy prn: Information regarding the procedure, including its potential risks and complications (including but not limited to perforation of the bowel, which may require emergency surgery to repair, and bleeding) was verbally given to the patient. Educational information regarding lower intestinal endoscopy was given to the patient. Written instructions for how to complete the bowel prep using Miralax were provided. The importance of drinking ample fluids to avoid dehydration as a result of the prep emphasized.  Patient has been scheduled for a colonoscopy on 07-10-16 at [facility].     This information has been scribed by Karie Fetch RN, BSN,BC.   Robert Bellow 05/30/2016, 12:02 PM

## 2016-06-18 ENCOUNTER — Other Ambulatory Visit: Payer: Self-pay | Admitting: Internal Medicine

## 2016-06-30 NOTE — Telephone Encounter (Signed)
Opened in error

## 2016-07-01 ENCOUNTER — Encounter: Payer: Self-pay | Admitting: *Deleted

## 2016-07-01 NOTE — Progress Notes (Signed)
Patient contacted the office wanting to reschedule his colonoscopy from 07-10-16 to 07-24-16.  Kieth Brightly in Endoscopy has been notified of date change.

## 2016-07-17 ENCOUNTER — Encounter: Payer: Self-pay | Admitting: *Deleted

## 2016-07-22 ENCOUNTER — Telehealth: Payer: Self-pay | Admitting: *Deleted

## 2016-07-22 NOTE — Telephone Encounter (Signed)
Patient called the office today with a few questions about colonoscopy. Questions were answered.   He reports no medication changes since his last office visit.  Also, states he has yet to pick up Miralax prescription but was instructed to do so.   We will proceed with colonoscopy as scheduled for Wednesday, 07-24-16 at [facility].

## 2016-07-23 ENCOUNTER — Telehealth: Payer: Self-pay | Admitting: Urology

## 2016-07-23 NOTE — Telephone Encounter (Signed)
Pt called office asking to speak to [facility].  Explained that [name] is in clinic seeing Pt's, how could I help? Pt seemed frustrated that I took time to put this message in (chart) phone note stating that he'll just text [name] himself, he didn't realize it was this difficult to get a message to her, that I have apparently not been here long, as everyone "here" should know him and just give [name] the message. Please call pt at your earliest convenience. Thanks.

## 2016-07-24 ENCOUNTER — Encounter: Payer: Self-pay | Admitting: *Deleted

## 2016-07-24 ENCOUNTER — Ambulatory Visit
Admission: RE | Admit: 2016-07-24 | Discharge: 2016-07-24 | Disposition: A | Discharge status: Home / Self Care | Payer: Medicare Other | Source: Ambulatory Visit | Attending: General Surgery | Admitting: General Surgery

## 2016-07-24 ENCOUNTER — Ambulatory Visit: Payer: Medicare Other | Admitting: Certified Registered Nurse Anesthetist

## 2016-07-24 ENCOUNTER — Encounter
Admission: RE | Disposition: A | Discharge status: Home / Self Care | Payer: Self-pay | Source: Ambulatory Visit | Attending: General Surgery

## 2016-07-24 DIAGNOSIS — Z87891 Personal history of nicotine dependence: Secondary | ICD-10-CM | POA: Diagnosis not present

## 2016-07-24 DIAGNOSIS — E785 Hyperlipidemia, unspecified: Secondary | ICD-10-CM | POA: Insufficient documentation

## 2016-07-24 DIAGNOSIS — F329 Major depressive disorder, single episode, unspecified: Secondary | ICD-10-CM | POA: Diagnosis not present

## 2016-07-24 DIAGNOSIS — Z952 Presence of prosthetic heart valve: Secondary | ICD-10-CM | POA: Insufficient documentation

## 2016-07-24 DIAGNOSIS — I10 Essential (primary) hypertension: Secondary | ICD-10-CM | POA: Diagnosis not present

## 2016-07-24 DIAGNOSIS — Z79899 Other long term (current) drug therapy: Secondary | ICD-10-CM | POA: Insufficient documentation

## 2016-07-24 DIAGNOSIS — K5730 Diverticulosis of large intestine without perforation or abscess without bleeding: Secondary | ICD-10-CM | POA: Insufficient documentation

## 2016-07-24 DIAGNOSIS — Z1211 Encounter for screening for malignant neoplasm of colon: Secondary | ICD-10-CM | POA: Diagnosis not present

## 2016-07-24 DIAGNOSIS — Z86010 Personal history of colonic polyps: Secondary | ICD-10-CM | POA: Diagnosis not present

## 2016-07-24 DIAGNOSIS — G709 Myoneural disorder, unspecified: Secondary | ICD-10-CM | POA: Insufficient documentation

## 2016-07-24 DIAGNOSIS — Z8551 Personal history of malignant neoplasm of bladder: Secondary | ICD-10-CM | POA: Diagnosis not present

## 2016-07-24 DIAGNOSIS — K219 Gastro-esophageal reflux disease without esophagitis: Secondary | ICD-10-CM | POA: Diagnosis not present

## 2016-07-24 DIAGNOSIS — I4891 Unspecified atrial fibrillation: Secondary | ICD-10-CM | POA: Diagnosis not present

## 2016-07-24 HISTORY — PX: COLONOSCOPY WITH PROPOFOL: SHX5780

## 2016-07-24 SURGERY — COLONOSCOPY WITH PROPOFOL
Anesthesia: General

## 2016-07-24 MED ORDER — PROPOFOL 500 MG/50ML IV EMUL
INTRAVENOUS | Status: AC
  Filled 2016-07-24: qty 50

## 2016-07-24 MED ORDER — PROPOFOL 10 MG/ML IV BOLUS
INTRAVENOUS | Status: AC
  Filled 2016-07-24: qty 20

## 2016-07-24 MED ORDER — SODIUM CHLORIDE 0.9 % IV SOLN
INTRAVENOUS | Status: DC
Start: 2016-07-24 — End: 2016-07-24
  Administered 2016-07-24: 1000 mL via INTRAVENOUS

## 2016-07-24 MED ORDER — PROPOFOL 10 MG/ML IV BOLUS
INTRAVENOUS | Status: DC | PRN
  Administered 2016-07-24: 30 mg via INTRAVENOUS
  Administered 2016-07-24: 60 mg via INTRAVENOUS
  Administered 2016-07-24: 20 mg via INTRAVENOUS
  Administered 2016-07-24: 30 mg via INTRAVENOUS

## 2016-07-24 MED ORDER — EPHEDRINE SULFATE 50 MG/ML IJ SOLN
INTRAMUSCULAR | Status: DC | PRN
  Administered 2016-07-24: 10 mg via INTRAVENOUS

## 2016-07-24 MED ORDER — PROPOFOL 500 MG/50ML IV EMUL
INTRAVENOUS | Status: DC | PRN
  Administered 2016-07-24: 150 ug/kg/min via INTRAVENOUS

## 2016-07-24 NOTE — Transfer of Care (Signed)
Immediate Anesthesia Transfer of Care Note  Patient: Tony Cox  Procedure(s) Performed: Procedure(s): COLONOSCOPY WITH PROPOFOL (N/A)  Patient Location: PACU  Anesthesia Type:General  Level of Consciousness: awake, alert  and oriented  Airway & Oxygen Therapy: Patient Spontanous Breathing and Patient connected to nasal cannula oxygen  Post-op Assessment: Report given to RN and Post -op Vital signs reviewed and stable  Post vital signs: Reviewed and stable  Last Vitals:  Vitals:   07/24/16 1050 07/24/16 1235  BP: 128/71 99/66  Pulse: 60 66  Resp: 16 17  Temp: 36.2 C 36.4 C    Last Pain:  Vitals:   07/24/16 1235  TempSrc: Tympanic         Complications: No apparent anesthesia complications

## 2016-07-24 NOTE — Anesthesia Preprocedure Evaluation (Signed)
Anesthesia Evaluation  Patient identified by MRN, date of birth, ID band Patient awake    Reviewed: Allergy & Precautions, H&P , NPO status , Patient's Chart, lab work & pertinent test results, reviewed documented beta blocker date and time   Airway Mallampati: II   Neck ROM: full    Dental  (+) Poor Dentition   Pulmonary neg pulmonary ROS, former smoker,    Pulmonary exam normal        Cardiovascular hypertension, negative cardio ROS Normal cardiovascular exam+ Valvular Problems/Murmurs MR  Rhythm:regular Rate:Normal     Neuro/Psych PSYCHIATRIC DISORDERS  Neuromuscular disease negative neurological ROS  negative psych ROS   GI/Hepatic negative GI ROS, Neg liver ROS, GERD  Medicated,  Endo/Other  negative endocrine ROS  Renal/GU negative Renal ROS  negative genitourinary   Musculoskeletal   Abdominal   Peds  Hematology negative hematology ROS (+)   Anesthesia Other Findings Past Medical History: 2015: Cancer ([place])     Comment: T2 uroepithelial bladder carcinoma  No date: Depression No date: GERD (gastroesophageal reflux disease) No date: Heart murmur No date: History of atrial fibrillation without current*     Comment: POST OP MITRIAL VALVE REPLACEMENT IN 2004 CARDIOLOGIST-- DR FATH--  LOV NOTE W/ CHART 10-30-2011: History of mitral valve repair     Comment: PLACEMENT OF COSGROVE RING DUE TO CONGENITAL               RUPTURE OF CHORDAE No date: Hyperlipidemia No date: Hypertension Past Surgical History: No date: CARDIAC CATHETERIZATION 11/20/2015: CATARACT EXTRACTION W/PHACO Left     Comment: Procedure: CATARACT EXTRACTION PHACO AND               INTRAOCULAR LENS PLACEMENT ([place]);  Surgeon:               Birder Robson, MD;  Location: [facility] ORS;                Service: Ophthalmology;  Laterality: Left;                [place] AP% CDE fluid pack lot # [id number] 2011: CHOLECYSTECTOMY 2009, 2013: COLONOSCOPY  Comment: Dr Vassie Moment ADENOMA  2014: cystectomy     Comment: ileal conduit diversion  2011: HERNIA REPAIR     Comment: umbilical  AS CHILD: TONSILLECTOMY 11-03-2008  DR Ubaldo Glassing [name]): TRANSTHORACIC ECHOCARDIOGRAM     Comment: LVF NORMAL/ EF 55-60%/ LEFT ATRIAL               ENLARGEMENT/  ADEQUATELY FUNCTIONING MITRAL               VALVE REPAIR/  MILD MR & TRI/  NO SIGNIFICANT               CHANGE FROM PREVIOUS ECHO 07/27/2012: TRANSURETHRAL RESECTION OF BLADDER TUMOR N/A     Comment: Procedure: TRANSURETHRAL RESECTION OF BLADDER               TUMOR (TURBT);  Surgeon: Franchot Gallo, MD;              Location: [facility];  Service:              Urology;  Laterality: N/A;  WITH MITOMYCIN               INSTILLATION  2004   [facility]: valvuloplasty with replacement of cosgrove rin*     Comment: mitral valve  BMI    Body Mass Index:  22.58 kg/m  Reproductive/Obstetrics negative OB ROS                             Anesthesia Physical Anesthesia Plan  ASA: III  Anesthesia Plan: General   Post-op Pain Management:    Induction:   Airway Management Planned:   Additional Equipment:   Intra-op Plan:   Post-operative Plan:   Informed Consent: I have reviewed the patients History and Physical, chart, labs and discussed the procedure including the risks, benefits and alternatives for the proposed anesthesia with the patient or authorized representative who has indicated his/her understanding and acceptance.   Dental Advisory Given  Plan Discussed with: CRNA  Anesthesia Plan Comments:         Anesthesia Quick Evaluation

## 2016-07-24 NOTE — Op Note (Signed)
[facility] Gastroenterology Patient Name: Tony Cox Procedure Date: 07/24/2016 11:52 AM MRN: [id number] Account #: [id number] Date of Birth: March 18, 1951 Admit Type: Outpatient Age: 66 Room: [facility] ENDO ROOM 1 Gender: Male Note Status: Finalized Procedure:            Colonoscopy Indications:          High risk colon cancer surveillance: Personal history                        of colonic polyps Providers:            Robert Bellow, MD Referring MD:         Deborra Medina, MD (Referring MD) Medicines:            Monitored Anesthesia Care Complications:        No immediate complications. Procedure:            Pre-Anesthesia Assessment:                       - Prior to the procedure, a History and Physical was                        performed, and patient medications, allergies and                        sensitivities were reviewed. The patient's tolerance of                        previous anesthesia was reviewed.                       - The risks and benefits of the procedure and the                        sedation options and risks were discussed with the                        patient. All questions were answered and informed                        consent was obtained.                       After obtaining informed consent, the colonoscope was                        passed under direct vision. Throughout the procedure,                        the patient's blood pressure, pulse, and oxygen                        saturations were monitored continuously. The                        Colonoscope was introduced through the anus and                        advanced to the the cecum, identified by the  appendiceal orifice, IC valve and transillumination.                        The colonoscopy was technically difficult and complex                        due to restricted mobility of the colon. Successful                        completion of the  procedure was aided by changing the                        patient to a supine position. The patient tolerated the                        procedure well. The quality of the bowel preparation                        was excellent.                       Relative fixation of the proximal sigmoid relieved w/                        rolling to the supine position. Findings:      A few small-mouthed diverticula were found in the sigmoid colon.      The retroflexed view of the distal rectum and anal verge was normal and       showed no anal or rectal abnormalities. Impression:           - Diverticulosis in the sigmoid colon.                       - The distal rectum and anal verge are normal on                        retroflexion view.                       - No specimens collected. Recommendation:       - Repeat colonoscopy in 5 years for surveillance. Procedure Code(s):    --- Professional ---                       [phone], Colonoscopy, flexible; diagnostic, including                        collection of specimen(s) by brushing or washing, when                        performed (separate procedure) Diagnosis Code(s):    --- Professional ---                       Z86.010, Personal history of colonic polyps                       K57.30, Diverticulosis of large intestine without                        perforation or abscess without bleeding CPT copyright 2016 American Medical Association. All rights reserved.  The codes documented in this report are preliminary and upon coder review may  be revised to meet current compliance requirements. Robert Bellow, MD 07/24/2016 12:29:17 PM This report has been signed electronically. Number of Addenda: 0 Note Initiated On: 07/24/2016 11:52 AM Scope Withdrawal Time: 0 hours 7 minutes 10 seconds  Total Procedure Duration: 0 hours 26 minutes 32 seconds       [facility]

## 2016-07-24 NOTE — H&P (Signed)
Tony Cox [id number] January 17, 1951     HPI:  66 y/o male s/p transverse colon tubular adenoma removal in 2013.  For follow up colonoscopy. Tolerated prep well.  Receiving po antibiotic for a dermal infection on the right wrist per dermatology. S/P cystectomy and ileal loop stoma since last exam.   Prescriptions Prior to Admission  Medication Sig Dispense Refill Last Dose  . carvedilol (COREG CR) 10 MG 24 hr capsule Take 10 mg by mouth every evening.   07/23/2016 at Unknown time  . Cholecalciferol (VITAMIN D3) 10000 units TABS Take 1 tablet by mouth daily.   07/23/2016 at Unknown time  . Clindamycin Phosphate foam APPLY 1 GRAM ON SKIN DAILY  5 Taking  . desonide (DESOWEN) 0.05 % lotion    Taking  . dexlansoprazole (DEXILANT) 60 MG capsule Take 1 capsule (60 mg total) by mouth daily. 30 capsule 2 07/24/2016 at 0900  . dicyclomine (BENTYL) 10 MG capsule TAKE 1 CAPSULE TWICE DAILY 60 capsule 3 07/24/2016 at 0900  . gabapentin (NEURONTIN) 300 MG capsule Take 1 capsule (300 mg total) by mouth 3 (three) times daily. 90 capsule 12 07/24/2016 at 0900  . Probiotic Product (PROBIOTIC & ACIDOPHILUS EX ST PO) Take 2 tablets by mouth at bedtime as needed.   07/24/2016 at 0900  . hydrocortisone-pramoxine (ANALPRAM-HC) 2.5-1 % rectal cream APPLY A SMALL AMOUNT TWICE  A DAY AS NEEDED 30 g 0 Taking  . mirtazapine (REMERON) 15 MG tablet Take 1 tablet (15 mg total) by mouth at bedtime. (Patient taking differently: Take 7.5 mg by mouth at bedtime. ) 30 tablet 2 Taking  . polyethylene glycol (MIRALAX / GLYCOLAX) packet Take 17 g by mouth as needed.    Taking  . polyethylene glycol powder (GLYCOLAX/MIRALAX) powder 255 grams one bottle for colonoscopy prep 255 g 0    Allergies  Allergen Reactions  . Codeine Nausea And Vomiting and Hives   Past Medical History:  Diagnosis Date  . Cancer ([place]) 2015   T2 uroepithelial bladder carcinoma   . Depression   . GERD (gastroesophageal reflux disease)   . Heart murmur   .  History of atrial fibrillation without current medication    POST OP MITRIAL VALVE REPLACEMENT IN 2004  . 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 Hyperlipidemia   . Hypertension    Past Surgical History:  Procedure Laterality Date  . CARDIAC CATHETERIZATION    . CATARACT EXTRACTION W/PHACO Left 11/20/2015   Procedure: CATARACT EXTRACTION PHACO AND INTRAOCULAR LENS PLACEMENT (IOC);  Surgeon: Birder Robson, MD;  Location: [facility] ORS;  Service: Ophthalmology;  Laterality: Left;  [place] AP% CDE fluid pack lot # [id number]  . CHOLECYSTECTOMY  2011  . COLONOSCOPY  2009, 2013   Dr Vassie Moment ADENOMA   . cystectomy  2014   ileal conduit diversion   . HERNIA REPAIR  2683   umbilical   . TONSILLECTOMY  AS CHILD  . TRANSTHORACIC ECHOCARDIOGRAM  11-03-2008  DR FATH [name])   LVF NORMAL/ EF 55-60%/ LEFT ATRIAL ENLARGEMENT/  ADEQUATELY FUNCTIONING MITRAL VALVE REPAIR/  MILD MR & TRI/  NO SIGNIFICANT CHANGE FROM PREVIOUS ECHO  . TRANSURETHRAL RESECTION OF BLADDER TUMOR N/A 07/27/2012   Procedure: TRANSURETHRAL RESECTION OF BLADDER TUMOR (TURBT);  Surgeon: Franchot Gallo, MD;  Location: [facility];  Service: Urology;  Laterality: N/A;  WITH MITOMYCIN INSTILLATION   .  valvuloplasty with replacement of cosgrove ring,1 congenital ruptur of cordi  2004   [facility]   mitral valve    Social History   Social History  . Marital status: Single    Spouse name: N/A  . Number of children: N/A  . Years of education: N/A   Occupational History  . Not on file.   Social History Main Topics  . Smoking status: Former Research scientist (life sciences)  . Smokeless tobacco: Never Used     Comment: quit 2 years  . Alcohol use 0.6 oz/week    1 Standard drinks or equivalent per week     Comment: occasional  . Drug use: No  . Sexual activity: Not on file   Other Topics Concern  . Not on  file   Social History Narrative   High school Investment banker, corporate    Social History   Social History Narrative   High school choir director      ROS: Negative.     PE: HEENT: Negative. Lungs: Clear. Cardio: RR.  Assessment/Plan:  Proceed with planned endoscopy.   Robert Bellow 07/24/2016

## 2016-07-24 NOTE — Anesthesia Post-op Follow-up Note (Cosign Needed)
Anesthesia QCDR form completed.        

## 2016-07-24 NOTE — Anesthesia Postprocedure Evaluation (Signed)
Anesthesia Post Note  Patient: Tony Cox  Procedure(s) Performed: Procedure(s) (LRB): COLONOSCOPY WITH PROPOFOL (N/A)  Patient location during evaluation: PACU Anesthesia Type: General Level of consciousness: awake and alert and oriented Pain management: pain level controlled Vital Signs Assessment: post-procedure vital signs reviewed and stable Respiratory status: spontaneous breathing Cardiovascular status: blood pressure returned to baseline Anesthetic complications: no     Last Vitals:  Vitals:   07/24/16 1255 07/24/16 1305  BP: 116/86 124/80  Pulse: (!) 58 (!) 58  Resp: 17 17  Temp:      Last Pain:  Vitals:   07/24/16 1235  TempSrc: Tympanic                 [name]

## 2016-07-24 NOTE — Telephone Encounter (Signed)
Spoke with patient and he was seeing spots of blood in his underwear.  He did speak with Dr. Lajuan Lines in [place] and he recommends no intervention unless it happens again.  We both recommend using lubricating lotions on the meatus.

## 2016-07-25 ENCOUNTER — Encounter: Payer: Self-pay | Admitting: General Surgery

## 2016-07-25 NOTE — Progress Notes (Signed)
07/26/2016 11:02 AM   Tony Cox 08/14/1950 [id number]  Referring provider: Crecencio Mc, MD [place], [place] [address]  Chief Complaint  Patient presents with  . 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  Background history Tony Cox is a 66 year old C[place] male who is status post robotic assisted cystectomy/prostatectomy with ileo conduit diversion performed by Tony Cox the on 01/28/2013 for muscle invasive bladder cancer status post neoadjuvant it chemotherapy who presents today for follow up visit.  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  He has had one urinary tract infection positive for enterococcus and Escherichia coli  on 10/01/2014.   He does have stoma pain associated with a hernia for which he uses a compression belt and takes gabapentin and tramadol.  The belt can become uncomfortable when worn for long periods of time.  He also have significant discomfort when he is standing for long periods of time.  He has not had recent fevers, chills, nausea or vomiting.  He is not having difficulty with the stroma or foul drainage.  He has recently had a series of urethral washing which have returned atypical urothelial cells, suspicious cells and more recently atypical urothelial cells on 01/18/2016.  He has been on a protocol of imaging every three months (patient driven) with Tony Cox.  There has not been evidence of reoccurrence or metastasis.    Patient wears an athletic supporter with tissue in the crotch area to monitor for bleeding.  Recently, he had noted some spotting in his tissue and become concerned.   He had spoken  with Tony Cox in [place] and he recommends no intervention unless it happens again.  We both recommend using lubricating lotions on the meatus.    Patient brings in the tissue from my evaluation. The tissue has a few small spots of light blood consistent with a point contact with an area of irritation.    PMH: Past Medical History:  Diagnosis Date  . Cancer ([place]) 2015   T2 uroepithelial bladder carcinoma   . Depression   . GERD (gastroesophageal reflux disease)   . Heart murmur   . History of atrial fibrillation without current medication    POST OP MITRIAL VALVE REPLACEMENT IN 2004  . 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 Hyperlipidemia   . Hypertension     Surgical History: Past Surgical History:  Procedure Laterality Date  . CARDIAC CATHETERIZATION    . CATARACT EXTRACTION W/PHACO Left 11/20/2015   Procedure: CATARACT EXTRACTION PHACO AND INTRAOCULAR LENS PLACEMENT (IOC);  Surgeon: Birder Robson, MD;  Location: [facility] ORS;  Service: Ophthalmology;  Laterality: Left;  [place] AP% CDE fluid pack lot # [id number]  . CHOLECYSTECTOMY  2011  . COLONOSCOPY  2009, 2013   Tony Vassie Moment ADENOMA   . COLONOSCOPY WITH PROPOFOL N/A 07/24/2016   Procedure: COLONOSCOPY WITH PROPOFOL;  Surgeon: Robert Bellow, MD;  Location[facility] END[facility]COPY;  Service: Endoscopy;  Laterality: N/A;  . cystectomy  2014   ileal conduit diversion   .  HERNIA REPAIR  7408   umbilical   . TONSILLECTOMY  AS CHILD  . TRANSTHORACIC ECHOCARDIOGRAM  11-03-2008  Tony FATH [name])   LVF NORMAL/ EF 55-60%/ LEFT ATRIAL ENLARGEMENT/  ADEQUATELY FUNCTIONING MITRAL VALVE REPAIR/  MILD MR & TRI/  NO SIGNIFICANT CHANGE FROM PREVIOUS ECHO  . TRANSURETHRAL RESECTION OF BLADDER TUMOR N/A 07/27/2012   Procedure: TRANSURETHRAL RESECTION OF BLADDER TUMOR (TURBT);  Surgeon: Franchot Gallo, MD;  Location: [facility];  Service: Urology;  Laterality: N/A;  WITH MITOMYCIN INSTILLATION   . valvuloplasty with replacement of cosgrove ring,1 congenital ruptur of cordi  2004   [facility]   mitral valve     Home Medications:  Allergies as of 07/26/2016      Reactions   Codeine Nausea And Vomiting, Hives      Medication List       Accurate as of 07/26/16 11:59 PM. Always use your most recent med list.          carvedilol 10 MG 24 hr capsule Commonly known as:  COREG CR Take 10 mg by mouth every evening.   Clindamycin Phosphate foam APPLY 1 GRAM ON SKIN DAILY   desonide 0.05 % lotion Commonly known as:  DESOWEN   dexlansoprazole 60 MG capsule Commonly known as:  DEXILANT Take 1 capsule (60 mg total) by mouth daily.   dicyclomine 10 MG capsule Commonly known as:  BENTYL TAKE 1 CAPSULE TWICE DAILY   gabapentin 300 MG capsule Commonly known as:  NEURONTIN Take 1 capsule (300 mg total) by mouth 3 (three) times daily.   hydrocortisone-pramoxine 2.5-1 % rectal cream Commonly known as:  ANALPRAM-HC APPLY A SMALL AMOUNT TWICE  A DAY AS NEEDED   mirtazapine 15 MG tablet Commonly known as:  REMERON Take 1 tablet (15 mg total) by mouth at bedtime.   polyethylene glycol packet Commonly known as:  MIRALAX / GLYCOLAX Take 17 g by mouth as needed.   polyethylene glycol powder powder Commonly known as:  GLYCOLAX/MIRALAX 255 grams one bottle for colonoscopy prep   PROBIOTIC & ACIDOPHILUS EX ST PO Take 2 tablets by mouth at bedtime as needed.   Vitamin D3 10000 units Tabs Take 1 tablet by mouth daily.       Allergies:  Allergies  Allergen Reactions  . Codeine Nausea And Vomiting and Hives    Family History: Family History  Problem Relation Age of Onset  . Adopted: Yes  . Colon cancer Mother   . Cancer Neg Hx     Prostate,Kidney,Bladder    Social History:  reports that he has quit smoking. He has never used smokeless tobacco. He reports that he drinks about  0.6 oz of alcohol per week . He reports that he does not use drugs.  ROS: UROLOGY Frequent Urination?: No Hard to postpone urination?: No Burning/pain with urination?: No Get up at night to urinate?: No Leakage of urine?: No Urine stream starts and stops?: No Trouble starting stream?: No Do you have to strain to urinate?: No Blood in urine?: No Urinary tract infection?: No Sexually transmitted disease?: No Injury to kidneys or bladder?: No Painful intercourse?: No Weak stream?: No Erection problems?: No Penile pain?: No Gastrointestinal Nausea?: No Vomiting?: No Indigestion/heartburn?: No Diarrhea?: No Constipation?: No Constitutional Fever: No Night sweats?: No Weight loss?: No Fatigue?: No Skin Skin rash/lesions?: No Itching?: No Eyes Blurred vision?: No Double vision?: No Ears/Nose/Throat Sore throat?: No Sinus problems?: No Hematologic/Lymphatic Swollen glands?: No Easy bruising?: No Cardiovascular  Leg swelling?: No Chest pain?: No Respiratory Cough?: No Shortness of breath?: No Endocrine Excessive thirst?: No Musculoskeletal Back pain?: No Joint pain?: No Neurological Headaches?: No Dizziness?: No Psychologic Depression?: No Anxiety?: No     Physical Exam: BP 118/76   Pulse 90   Ht 5\' 6"  (1.676 m)   Wt 142 lb (64.4 kg)   BMI 22.92 kg/m   Constitutional: Well nourished. Alert and oriented, No acute distress. HEENT: [place] AT, moist mucus membranes. Trachea midline, no masses. Cardiovascular: No clubbing, cyanosis, or edema. Respiratory: Normal respiratory effort, no increased work of breathing. GI: Abdomen is soft, non tender, non distended, no abdominal masses. Liver and spleen not palpable.  Abdominal hernia appreciated.  Stool sample for occult testing is not indicated.  Stoma located in the right lower quadrant.  No rash.  It is pink and healthy.  Urine in drainage bag is clear yellow.   GU: No CVA tenderness.  No bladder fullness or  masses.  Penis is circumcised.  There are two small areas ( < 1 mm each) located on the dorsal side of the meatus consistent with a separation of meatal tissue.   Skin: No rashes, bruises or suspicious lesions. Lymph: No cervical or inguinal adenopathy. Neurologic: Grossly intact, no focal deficits, moving all 4 extremities. Psychiatric: Normal mood and affect.  Laboratory Data: Results for orders placed or performed in visit on 02/27/16  CULTURE, URINE COMPREHENSIVE  Result Value Ref Range   Urine Culture, Comprehensive Final report (A)    Result 1 Escherichia coli (A)    Result 2 Comment    ANTIMICROBIAL SUSCEPTIBILITY Comment   Microscopic Examination  Result Value Ref Range   WBC, UA 0-5 0 - 5 /hpf   RBC, UA 0-2 0 - 2 /hpf   Epithelial Cells (non renal) None seen 0 - 10 /hpf   Bacteria, UA None seen None seen/Few  Urinalysis, Complete  Result Value Ref Range   Specific Gravity, UA <1.005 (L) 1.005 - 1.030   pH, UA 7.0 5.0 - 7.5   Color, UA Straw Yellow   Appearance Ur Clear Clear   Leukocytes, UA Trace (A) Negative   Protein, UA Negative Negative/Trace   Glucose, UA Negative Negative   Ketones, UA Negative Negative   RBC, UA Trace (A) Negative   Bilirubin, UA Negative Negative   Urobilinogen, Ur 0.2 0.2 - 1.0 mg/dL   Nitrite, UA Negative Negative   Microscopic Examination See below:    Lab Results  Component Value Date   WBC 5.4 10/13/2015   HGB 15.4 10/13/2015   HCT 45.9 10/13/2015   MCV 92.5 10/13/2015   PLT 176 10/13/2015    Lab Results  Component Value Date   CREATININE 0.95 10/13/2015    Lab Results  Component Value Date   PSA 2.1 10/19/2012   PSA 3.16 05/31/2011   PSA 1.80 10/09/2009    Lab Results  Component Value Date   TESTOSTERONE 489.65 05/31/2011     Pertinent Imaging:  Assessment & Plan:   1. Meatal tear  - Patient is reassured that this is benign finding explained that since urine is no longer moving through the urethra, the  urethra, especially in the meatal area, can fuse together  - if continues to occur - he will seek further evaluation with Tony Cox  2. Bladder cancer:  Patient is status post robotic assisted cystectomy/prostatectomy with ileo conduit diversion performed by Tony Cox the on 01/28/2013.  He is being followed at  [facility] in [place].    3. Prostate cancer:   Patient is status post robotic assisted cystectomy/prostatectomy with ileo conduit diversion performed by Tony Cox the on 01/28/2013.  He is being followed at [facility] in [place].     Return if symptoms worsen or fail to improve.  Tony Cox, [facility] [address], [place], [place] [address] [phone]

## 2016-07-26 ENCOUNTER — Encounter: Payer: Self-pay | Admitting: Urology

## 2016-07-26 ENCOUNTER — Ambulatory Visit (INDEPENDENT_AMBULATORY_CARE_PROVIDER_SITE_OTHER): Payer: Medicare Other | Admitting: Urology

## 2016-07-26 VITALS — BP 118/76 | HR 90 | Ht 66.0 in | Wt 142.0 lb

## 2016-07-26 DIAGNOSIS — N359 Urethral stricture, unspecified: Secondary | ICD-10-CM

## 2016-07-26 DIAGNOSIS — IMO0002 Reserved for concepts with insufficient information to code with codable children: Secondary | ICD-10-CM

## 2016-08-22 ENCOUNTER — Other Ambulatory Visit: Payer: Self-pay | Admitting: Urology

## 2016-08-29 NOTE — Telephone Encounter (Signed)
error 

## 2016-09-18 ENCOUNTER — Other Ambulatory Visit: Payer: Self-pay | Admitting: Internal Medicine

## 2016-09-19 ENCOUNTER — Other Ambulatory Visit: Payer: Self-pay | Admitting: Urology

## 2016-09-19 MED ORDER — DEXLANSOPRAZOLE 60 MG PO CPDR: 60.0000 mg | DELAYED_RELEASE_CAPSULE | Freq: Every day | ORAL | 12 refills | Status: DC

## 2016-09-24 ENCOUNTER — Ambulatory Visit: Payer: Medicare Other

## 2016-09-25 ENCOUNTER — Other Ambulatory Visit: Payer: Self-pay | Admitting: Urology

## 2016-09-30 ENCOUNTER — Ambulatory Visit (INDEPENDENT_AMBULATORY_CARE_PROVIDER_SITE_OTHER): Payer: Medicare Other

## 2016-09-30 VITALS — BP 125/79 | HR 56 | Ht 66.0 in | Wt 146.5 lb

## 2016-09-30 DIAGNOSIS — N390 Urinary tract infection, site not specified: Secondary | ICD-10-CM | POA: Diagnosis not present

## 2016-09-30 LAB — MICROSCOPIC EXAMINATION: RBC, UA: NONE SEEN /hpf (ref 0–?)

## 2016-09-30 LAB — URINALYSIS, COMPLETE
Bilirubin, UA: NEGATIVE
Glucose, UA: NEGATIVE
Ketones, UA: NEGATIVE
Nitrite, UA: NEGATIVE
Protein, UA: NEGATIVE
RBC, UA: NEGATIVE
Specific Gravity, UA: <1.005 — ABNORMAL LOW (ref 1.005–1.030)
Urobilinogen, Ur: 0.2 mg/dL (ref 0.2–1.0)
pH, UA: 7 (ref 5.0–7.5)

## 2016-09-30 NOTE — Progress Notes (Signed)
Pt c/o: nausea, fever, chills, back/ flank pain, lower abdominal pain. Provided urine sample. Advised will be contacted when results reviewed. Patient verbalized understanding.

## 2016-10-02 ENCOUNTER — Telehealth: Payer: Self-pay | Admitting: Internal Medicine

## 2016-10-02 NOTE — Telephone Encounter (Signed)
Left pt message asking to call Ebony Hail back directly at [phone] to schedule AWV. Thanks!  *Pt no showed for appt on 09/24/16

## 2016-10-03 ENCOUNTER — Telehealth: Payer: Self-pay

## 2016-10-03 LAB — URINE CULTURE

## 2016-10-03 MED ORDER — NITROFURANTOIN MONOHYD MACRO 100 MG PO CAPS
100.0000 mg | ORAL_CAPSULE | Freq: Two times a day (BID) | ORAL | 0 refills | Status: DC
Start: 2016-10-03 — End: 2017-01-27

## 2016-10-03 NOTE — Telephone Encounter (Signed)
Patient notified that urine culture was positive for infection and a script for nitrofurantion 100mg  BID #14 per Dr. Pilar Jarvis sent to patient's pharm

## 2016-10-26 ENCOUNTER — Other Ambulatory Visit: Payer: Self-pay | Admitting: Internal Medicine

## 2016-11-05 HISTORY — PX: OTHER SURGICAL HISTORY: SHX169

## 2016-11-15 ENCOUNTER — Telehealth: Payer: Self-pay | Admitting: Internal Medicine

## 2016-11-15 NOTE — Telephone Encounter (Signed)
Pt out of town at the moment and will call back to schedule AWV.

## 2016-11-15 NOTE — Telephone Encounter (Signed)
Spoke to pt. He would like Dr. Derrel Nip to call him so that he can update her on his situation. Urologist found small bladder cancer cells again and ended up having a urethrectomy.   Pt would really like to talk to Dr. Derrel Nip. Pt call back [phone]  Thanks

## 2016-11-18 MED ORDER — MIRTAZAPINE 7.5 MG PO TABS: 7.5000 mg | ORAL_TABLET | Freq: Every day | ORAL | 3 refills | Status: DC

## 2016-11-18 MED ORDER — DICYCLOMINE HCL 10 MG PO CAPS: 10.0000 mg | ORAL_CAPSULE | Freq: Two times a day (BID) | ORAL | 3 refills | Status: DC

## 2016-11-18 MED ORDER — GABAPENTIN 300 MG PO CAPS: 300.0000 mg | ORAL_CAPSULE | Freq: Three times a day (TID) | ORAL | 3 refills | Status: DC

## 2016-11-18 NOTE — Telephone Encounter (Signed)
  Patient called .  Remeron. dicyclomine and gabapentin refilled for one year to [facility]

## 2016-11-19 NOTE — Progress Notes (Signed)
11/20/2016 11:14 PM   Tony Cox [birth year] [id number]  Referring provider: Crecencio Mc, [facility] [address] [place], [place] [address]  Chief Complaint  Patient presents with  . 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    Background history Tony Cox is a 66 year old Causation male who is status post robotic assisted cystectomy/prostatectomy with ileo conduit diversion performed by Tony Cox the on 01/28/2013 for muscle invasive bladder cancer status post neoadjuvant it chemotherapy who presents today for follow up visit.  His pathological diagnosis was pT2b TCC positive bladder cancer and pT2c NO Adenocarcinoma of the prostate who is currently being followed and managed by Tony Cox at [facility] in [place].  He has had one urinary tract infection positive for enterococcus and Escherichia coli  on 10/01/2014.   He does have stoma pain associated with a hernia for which he uses a compression belt and takes gabapentin and tramadol.  The belt can become uncomfortable when worn for long periods of time.  He also have significant discomfort when he is standing for long periods of time.  He has not had recent fevers, chills, nausea or vomiting.  He is not having difficulty with the stroma or foul drainage.  He has recently had a series of urethral washing which have returned atypical urothelial cells, suspicious cells and more recently atypical urothelial cells on 01/18/2016.  He has been on a protocol of imaging every three months (patient driven) with Tony Cox.  There has not been evidence of reoccurrence or metastasis.    Patient underwent surgery with Tony Cox on 11/05/2016.  Pathology was benign.  He is doing well post operatively.  He is having some  mild swelling in the penis.  He has not had fevers, chills, nausea or vomiting.     PMH: Past Medical History:  Diagnosis Date  . Cancer ([place]) 2015   T2 uroepithelial bladder carcinoma   . Depression   . GERD (gastroesophageal reflux disease)   . Heart murmur   . History of atrial fibrillation without current medication    POST OP MITRIAL VALVE REPLACEMENT IN 2004  . 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 Hyperlipidemia   . Hypertension     Surgical History: Past Surgical History:  Procedure Laterality Date  . CARDIAC CATHETERIZATION    . CATARACT EXTRACTION W/PHACO Left 11/20/2015   Procedure: CATARACT EXTRACTION PHACO AND INTRAOCULAR LENS PLACEMENT (IOC);  Surgeon: Birder Robson, Cox;  Location: [facility] ORS;  Service: Ophthalmology;  Laterality: Left;  [place] AP% CDE fluid pack lot # [id number]  . CHOLECYSTECTOMY  2011  . COLONOSCOPY  2009, 2013   Tony Cox ADENOMA   . COLONOSCOPY WITH PROPOFOL N/A 07/24/2016   Procedure: COLONOSCOPY WITH PROPOFOL;  Surgeon: Tony Cox;  Location: [facility] ENDOSCOPY;  Service: Endoscopy;  Laterality: N/A;  . cystectomy  2014   ileal conduit diversion   . HERNIA REPAIR  7867   umbilical   . TONSILLECTOMY  AS CHILD  . TRANSTHORACIC ECHOCARDIOGRAM  11-03-2008  Tony FATH Tony Cox)   LVF NORMAL/ EF 55-60%/ LEFT ATRIAL ENLARGEMENT/  ADEQUATELY FUNCTIONING  MITRAL VALVE REPAIR/  MILD MR & TRI/  NO SIGNIFICANT CHANGE FROM PREVIOUS ECHO  . TRANSURETHRAL RESECTION OF BLADDER TUMOR N/A 07/27/2012   Procedure: TRANSURETHRAL RESECTION OF BLADDER TUMOR (TURBT);  Surgeon: Tony Cox;  Location: [facility];  Service: Urology;  Laterality: N/A;  WITH MITOMYCIN INSTILLATION   . urethroptomy  11/05/2016   [place]  . valvuloplasty with replacement of cosgrove ring,1 congenital ruptur of cordi  2004   [facility]   mitral valve     Home Medications:  Allergies as of 11/20/2016      Reactions   Codeine Nausea And Vomiting, Hives      Medication List       Accurate as of 11/20/16 11:14 PM. Always use your most recent med list.          carvedilol 10 MG 24 hr capsule Commonly known as:  COREG CR Take 10 mg by mouth every evening.   Clindamycin Phosphate foam APPLY 1 GRAM ON SKIN DAILY   clobetasol ointment 0.05 % Commonly known as:  TEMOVATE Apply topically.   desonide 0.05 % lotion Commonly known as:  DESOWEN   dexlansoprazole 60 MG capsule Commonly known as:  DEXILANT Take 1 capsule (60 mg total) by mouth daily.   dicyclomine 10 MG capsule Commonly known as:  BENTYL Take 1 capsule (10 mg total) by mouth 2 (two) times daily.   gabapentin 300 MG capsule Commonly known as:  NEURONTIN Take 1 capsule (300 mg total) by mouth 3 (three) times daily.   hydrocortisone-pramoxine 2.5-1 % rectal cream Commonly known as:  ANALPRAM-HC APPLY A SMALL AMOUNT TWICE  A DAY AS NEEDED   mirtazapine 7.5 MG tablet Commonly known as:  REMERON Take 1 tablet (7.5 mg total) by mouth at bedtime.   nitrofurantoin (macrocrystal-monohydrate) 100 MG capsule Commonly known as:  MACROBID Take 1 capsule (100 mg total) by mouth every 12 (twelve) hours.   polyethylene glycol packet Commonly known as:  MIRALAX / GLYCOLAX Take 17 g by mouth as needed.   polyethylene glycol powder powder Commonly known as:  GLYCOLAX/MIRALAX 255 grams one bottle for colonoscopy prep   PROBIOTIC & ACIDOPHILUS EX ST PO Take 2 tablets by mouth at bedtime as needed.   Vitamin D3 10000 units Tabs Take 1 tablet by mouth daily.       Allergies:  Allergies  Allergen Reactions  . Codeine Nausea And Vomiting and Hives    Family History: Family History  Problem Relation Age of Onset  . Adopted: Yes  . Colon cancer Mother   . Cancer Neg Hx        Prostate,Kidney,Bladder  . Prostate cancer Neg Hx   . Bladder  Cancer Neg Hx   . Kidney cancer Neg Hx     Social History:  reports that he quit smoking about 4 years ago. He has never used smokeless tobacco. He reports that he drinks about 0.6 oz of alcohol per week . He reports that he does not use drugs.  ROS: UROLOGY Frequent Urination?: No Hard to postpone urination?: No Burning/pain with urination?: No Get up at night to urinate?: No Leakage of urine?: No Urine stream starts and stops?: No Trouble starting stream?: No Do you have to strain to urinate?: No Blood in urine?: No Urinary tract infection?: No Sexually transmitted disease?: No Injury to kidneys or bladder?: No Painful intercourse?: No Weak stream?: No Erection problems?: No Penile pain?: No Gastrointestinal Nausea?: No Vomiting?: No Indigestion/heartburn?:  No Diarrhea?: No Constipation?: No Constitutional Fever: No Night sweats?: No Weight loss?: No Fatigue?: No Skin Skin rash/lesions?: No Itching?: No Eyes Blurred vision?: No Double vision?: No Ears/Nose/Throat Sore throat?: No Sinus problems?: No Hematologic/Lymphatic Swollen glands?: No Easy bruising?: No Cardiovascular Leg swelling?: No Chest pain?: No Respiratory Cough?: No Shortness of breath?: No Endocrine Excessive thirst?: No Musculoskeletal Back pain?: No Joint pain?: No Neurological Headaches?: No Dizziness?: No Psychologic Depression?: No Anxiety?: No     Physical Exam: BP 119/74   Pulse 73   Ht 5\' 5"  (1.651 m)   Wt 150 lb 3.2 oz (68.1 kg)   BMI 24.99 kg/m   Constitutional: Well nourished. Alert and oriented, No acute distress. HEENT: [place] AT, moist mucus membranes. Trachea midline, no masses. Cardiovascular: No clubbing, cyanosis, or edema. Respiratory: Normal respiratory effort, no increased work of breathing. GI: Abdomen is soft, non tender, non distended, no abdominal masses. Liver and spleen not palpable.  Abdominal hernia appreciated.  Stool sample for occult testing  is not indicated.  Stoma located in the right lower quadrant.  No rash.  It is pink and healthy.  Urine in drainage bag is clear yellow.   GU: No CVA tenderness.  No bladder fullness or masses.  Penis is circumcised.  Mild swelling in the coronal area on the ventral surface.  Incisions clean and dry.   Skin: No rashes, bruises or suspicious lesions. Lymph: No cervical or inguinal adenopathy. Neurologic: Grossly intact, no focal deficits, moving all 4 extremities. Psychiatric: Normal mood and affect.  Laboratory Data: Results for orders placed or performed in visit on 09/30/16  Urine Culture  Result Value Ref Range   Urine Culture, Routine Final report (A)    Organism ID, Bacteria Escherichia coli (A)    Antimicrobial Susceptibility Comment   Microscopic Examination  Result Value Ref Range   WBC, UA 0-5 0 - 5 /hpf   RBC, UA None seen 0 - 2 /hpf   Epithelial Cells (non renal) 0-10 0 - 10 /hpf   Bacteria, UA Few (A) None seen/Few  Urinalysis, Complete  Result Value Ref Range   Specific Gravity, UA <1.005 (L) 1.005 - 1.030   pH, UA 7.0 5.0 - 7.5   Color, UA Yellow Yellow   Appearance Ur Clear Clear   Leukocytes, UA Trace (A) Negative   Protein, UA Negative Negative/Trace   Glucose, UA Negative Negative   Ketones, UA Negative Negative   RBC, UA Negative Negative   Bilirubin, UA Negative Negative   Urobilinogen, Ur 0.2 0.2 - 1.0 mg/dL   Nitrite, UA Negative Negative   Microscopic Examination See below:      Pertinent Imaging:  Assessment & Plan:   1. Bladder cancer:  Patient is status post robotic assisted cystectomy/prostatectomy with ileo conduit diversion performed by Tony Cox the on 01/28/2013.  And now s/p urethrectomy by Tony Cox.  He is being followed at [facility] in [place].     2. Prostate cancer:   Patient is status post robotic assisted cystectomy/prostatectomy with ileo conduit diversion performed by Tony Cox the on 01/28/2013.  He is being followed  at [facility] in [place].     No Follow-up on file.  Zara Council, [facility] [address], [place], [place] [address] [phone]

## 2016-11-20 ENCOUNTER — Other Ambulatory Visit: Payer: Self-pay

## 2016-11-20 ENCOUNTER — Encounter: Payer: Self-pay | Admitting: Urology

## 2016-11-20 ENCOUNTER — Ambulatory Visit (INDEPENDENT_AMBULATORY_CARE_PROVIDER_SITE_OTHER): Payer: Medicare Other | Admitting: Urology

## 2016-11-20 VITALS — BP 119/74 | HR 73 | Ht 65.0 in | Wt 150.2 lb

## 2016-11-20 DIAGNOSIS — C61 Malignant neoplasm of prostate: Secondary | ICD-10-CM

## 2016-11-20 DIAGNOSIS — C679 Malignant neoplasm of bladder, unspecified: Secondary | ICD-10-CM

## 2016-12-01 NOTE — Progress Notes (Signed)
12/02/2016 1:20 PM   Tony Cox [birth year] [id number]  Referring provider: Crecencio Mc, MD [address] [place], [place] [address]  Chief Complaint  Patient presents with  . 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    Background history Tony Cox is a 66 year old Causation male who is status post robotic assisted cystectomy/prostatectomy with ileo conduit diversion performed by Dr. Ardath Sax the on 01/28/2013 for muscle invasive bladder cancer status post neoadjuvant it chemotherapy who presents today for follow up visit.  His pathological diagnosis was pT2b TCC positive bladder cancer and pT2c NO Adenocarcinoma of the prostate who is currently being followed and managed by Dr Lovette Cliche at [facility] in [place].  He has had one urinary tract infection positive for enterococcus and Escherichia coli  on 10/01/2014. He does have stoma pain associated with a hernia for which he uses a compression belt and takes gabapentin and tramadol.  The belt can become uncomfortable when worn for long periods of time.  He also have significant discomfort when he is standing for long periods of time.  He has not had recent fevers, chills, nausea or vomiting.  He is not having difficulty with the stroma or foul drainage.  He has recently had a series of urethral washing which have returned atypical urothelial cells, suspicious cells and more recently atypical urothelial cells on 01/18/2016.  He has been on a protocol of imaging every three months (patient driven) with Dr. Darcel Bayley.  There has not been evidence of reoccurrence or metastasis.    Patient underwent surgery with Dr. Darcel Bayley on 11/05/2016.  Pathology was benign.  He is doing well  post operatively.  He is having some mild swelling in the penis.  He has not had fevers, chills, nausea or vomiting.   Over the last few days, he has noticed a greenish color to his incision.  He is worried about infection.  He is controlling his post operative pain with Tramadol and 2000 mg of acetaminophen daily.      PMH: Past Medical History:  Diagnosis Date  . Cancer ([place]) 2015   T2 uroepithelial bladder carcinoma   . Depression   . GERD (gastroesophageal reflux disease)   . Heart murmur   . History of atrial fibrillation without current medication    POST OP MITRIAL VALVE REPLACEMENT IN 2004  . 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 Hyperlipidemia   . Hypertension     Surgical History: Past Surgical History:  Procedure Laterality Date  . CARDIAC CATHETERIZATION    . CATARACT EXTRACTION W/PHACO Left 11/20/2015   Procedure: CATARACT EXTRACTION PHACO AND INTRAOCULAR LENS PLACEMENT (IOC);  Surgeon: Birder Robson, MD;  Location: [facility] ORS;  Service: Ophthalmology;  Laterality: Left;  [place] AP% CDE fluid pack lot # [id number]  . CHOLECYSTECTOMY  2011  . COLONOSCOPY  2009, 2013   Dr Vassie Moment ADENOMA   . COLONOSCOPY WITH PROPOFOL N/A 07/24/2016   Procedure: COLONOSCOPY WITH PROPOFOL;  Surgeon: Robert Bellow, MD;  Location: [facility] ENDOSCOPY;  Service: Endoscopy;  Laterality: N/A;  . cystectomy  2014  ileal conduit diversion   . HERNIA REPAIR  9528   umbilical   . TONSILLECTOMY  AS CHILD  . TRANSTHORACIC ECHOCARDIOGRAM  11-03-2008  DR FATH [name])   LVF NORMAL/ EF 55-60%/ LEFT ATRIAL ENLARGEMENT/  ADEQUATELY FUNCTIONING MITRAL VALVE REPAIR/  MILD MR & TRI/  NO SIGNIFICANT CHANGE FROM PREVIOUS ECHO  . TRANSURETHRAL RESECTION OF BLADDER TUMOR N/A 07/27/2012   Procedure: TRANSURETHRAL RESECTION OF BLADDER TUMOR (TURBT);  Surgeon: Franchot Gallo, MD;   Location: [facility];  Service: Urology;  Laterality: N/A;  WITH MITOMYCIN INSTILLATION   . urethroptomy  11/05/2016   [place]  . valvuloplasty with replacement of cosgrove ring,1 congenital ruptur of cordi  2004   [facility]   mitral valve     Home Medications:  Allergies as of 12/02/2016      Reactions   Codeine Nausea And Vomiting, Hives      Medication List       Accurate as of 12/02/16  1:20 PM. Always use your most recent med list.          carvedilol 10 MG 24 hr capsule Commonly known as:  COREG CR Take 10 mg by mouth every evening.   cephALEXin 500 MG capsule Commonly known as:  KEFLEX Take 1 capsule (500 mg total) by mouth 2 (two) times daily.   Clindamycin Phosphate foam APPLY 1 GRAM ON SKIN DAILY   clobetasol ointment 0.05 % Commonly known as:  TEMOVATE Apply topically.   desonide 0.05 % lotion Commonly known as:  DESOWEN   dexlansoprazole 60 MG capsule Commonly known as:  DEXILANT Take 1 capsule (60 mg total) by mouth daily.   dicyclomine 10 MG capsule Commonly known as:  BENTYL Take 1 capsule (10 mg total) by mouth 2 (two) times daily.   gabapentin 300 MG capsule Commonly known as:  NEURONTIN Take 1 capsule (300 mg total) by mouth 3 (three) times daily.   hydrocortisone-pramoxine 2.5-1 % rectal cream Commonly known as:  ANALPRAM-HC APPLY A SMALL AMOUNT TWICE  A DAY AS NEEDED   mirtazapine 7.5 MG tablet Commonly known as:  REMERON Take 1 tablet (7.5 mg total) by mouth at bedtime.   nitrofurantoin (macrocrystal-monohydrate) 100 MG capsule Commonly known as:  MACROBID Take 1 capsule (100 mg total) by mouth every 12 (twelve) hours.   polyethylene glycol packet Commonly known as:  MIRALAX / GLYCOLAX Take 17 g by mouth as needed.   polyethylene glycol powder powder Commonly known as:  GLYCOLAX/MIRALAX 255 grams one bottle for colonoscopy prep   PROBIOTIC & ACIDOPHILUS EX ST PO Take 2 tablets by mouth at bedtime as  needed.   Vitamin D3 10000 units Tabs Take 1 tablet by mouth daily.       Allergies:  Allergies  Allergen Reactions  . Codeine Nausea And Vomiting and Hives    Family History: Family History  Problem Relation Age of Onset  . Adopted: Yes  . Colon cancer Mother   . Cancer Neg Hx        Prostate,Kidney,Bladder  . Prostate cancer Neg Hx   . Bladder Cancer Neg Hx   . Kidney cancer Neg Hx     Social History:  reports that he quit smoking about 4 years ago. He has never used smokeless tobacco. He reports that he drinks about 0.6 oz of alcohol per week . He reports that he does not use drugs.  ROS: UROLOGY Frequent Urination?: No Hard to postpone urination?: No Burning/pain with urination?:  No Get up at night to urinate?: No Leakage of urine?: No Urine stream starts and stops?: No Trouble starting stream?: No Do you have to strain to urinate?: No Blood in urine?: No Urinary tract infection?: No Sexually transmitted disease?: No Injury to kidneys or bladder?: No Painful intercourse?: No Weak stream?: No Erection problems?: No Penile pain?: No Gastrointestinal Nausea?: No Vomiting?: No Indigestion/heartburn?: No Diarrhea?: No Constipation?: No Constitutional Fever: No Night sweats?: No Weight loss?: No Fatigue?: No Skin Skin rash/lesions?: No Itching?: No Eyes Blurred vision?: No Double vision?: No Ears/Nose/Throat Sore throat?: No Sinus problems?: No Hematologic/Lymphatic Swollen glands?: No Easy bruising?: No Cardiovascular Leg swelling?: No Chest pain?: No Respiratory Cough?: No Shortness of breath?: No Endocrine Excessive thirst?: No Musculoskeletal Back pain?: No Joint pain?: No Neurological Headaches?: No Dizziness?: No Psychologic Depression?: No Anxiety?: No   Physical Exam: BP 120/74   Pulse 66   Ht 5\' 6"  (1.676 m)   Wt 148 lb 9.6 oz (67.4 kg)   BMI 23.98 kg/m   Constitutional: Well nourished. Alert and oriented, No acute  distress. HEENT: [place] AT, moist mucus membranes. Trachea midline, no masses. Cardiovascular: No clubbing, cyanosis, or edema. Respiratory: Normal respiratory effort, no increased work of breathing. GI: Abdomen is soft, non tender, non distended, no abdominal masses. Liver and spleen not palpable.  Abdominal hernia appreciated.  Stool sample for occult testing is not indicated.  Stoma located in the right lower quadrant.  No rash.  It is pink and healthy.  Urine in drainage bag is clear yellow.   GU: No CVA tenderness.  No bladder fullness or masses.  Penis is uncircumcised.  No swelling in the coronal area on the ventral surface.  Incisions with a greenish film.  No erythema.  No discharge from the incision.   Skin: No rashes, bruises or suspicious lesions. Lymph: No cervical or inguinal adenopathy. Neurologic: Grossly intact, no focal deficits, moving all 4 extremities. Psychiatric: Normal mood and affect.  Laboratory Data: Results for orders placed or performed in visit on 09/30/16  Urine Culture  Result Value Ref Range   Urine Culture, Routine Final report (A)    Organism ID, Bacteria Escherichia coli (A)    Antimicrobial Susceptibility Comment   Microscopic Examination  Result Value Ref Range   WBC, UA 0-5 0 - 5 /hpf   RBC, UA None seen 0 - 2 /hpf   Epithelial Cells (non renal) 0-10 0 - 10 /hpf   Bacteria, UA Few (A) None seen/Few  Urinalysis, Complete  Result Value Ref Range   Specific Gravity, UA <1.005 (L) 1.005 - 1.030   pH, UA 7.0 5.0 - 7.5   Color, UA Yellow Yellow   Appearance Ur Clear Clear   Leukocytes, UA Trace (A) Negative   Protein, UA Negative Negative/Trace   Glucose, UA Negative Negative   Ketones, UA Negative Negative   RBC, UA Negative Negative   Bilirubin, UA Negative Negative   Urobilinogen, Ur 0.2 0.2 - 1.0 mg/dL   Nitrite, UA Negative Negative   Microscopic Examination See below:      Pertinent Imaging:  Assessment & Plan:   1. Bladder cancer:   Patient is status post robotic assisted cystectomy/prostatectomy with ileo conduit diversion performed by Dr. Ardath Sax the on 01/28/2013.  And now s/p urethrectomy by Dr. Darcel Bayley.  He is being followed at [facility] in [place].   He is concerned that an infection may be occurring so we will refill his Keflex.  He  is also concerned about his tylenol use and would like some blood work performed.   Will obtain CMP and CBC.    2. Prostate cancer:   Patient is status post robotic assisted cystectomy/prostatectomy with ileo conduit diversion performed by Dr. Ardath Sax the on 01/28/2013.  He is being followed at [facility] in [place].     Return in about 1 week (around 12/09/2016) for exam.  Zara Council, [facility]  [facility] [address] [place], [place] [address] [phone]

## 2016-12-02 ENCOUNTER — Other Ambulatory Visit: Payer: Self-pay | Admitting: Urology

## 2016-12-02 ENCOUNTER — Ambulatory Visit: Payer: Medicare Other | Admitting: Urology

## 2016-12-02 ENCOUNTER — Encounter: Payer: Self-pay | Admitting: Urology

## 2016-12-02 VITALS — BP 120/74 | HR 66 | Ht 66.0 in | Wt 148.6 lb

## 2016-12-02 DIAGNOSIS — C61 Malignant neoplasm of prostate: Secondary | ICD-10-CM

## 2016-12-02 DIAGNOSIS — C679 Malignant neoplasm of bladder, unspecified: Secondary | ICD-10-CM | POA: Diagnosis not present

## 2016-12-02 MED ORDER — CEPHALEXIN 500 MG PO CAPS: 500.0000 mg | ORAL_CAPSULE | Freq: Two times a day (BID) | ORAL | 0 refills | Status: DC

## 2016-12-03 ENCOUNTER — Telehealth: Payer: Self-pay | Admitting: Urology

## 2016-12-03 LAB — COMPREHENSIVE METABOLIC PANEL
ALT: 16 IU/L (ref 0–44)
AST: 18 IU/L (ref 0–40)
Albumin/Globulin Ratio: 2.1 (ref 1.2–2.2)
Albumin: 4.7 g/dL (ref 3.6–4.8)
Alkaline Phosphatase: 76 IU/L (ref 39–117)
BUN/Creatinine Ratio: 12 (ref 10–24)
BUN: 12 mg/dL (ref 8–27)
Bilirubin Total: 0.3 mg/dL (ref 0.0–1.2)
CO2: 24 mmol/L (ref 20–29)
Calcium: 9.2 mg/dL (ref 8.6–10.2)
Chloride: 101 mmol/L (ref 96–106)
Creatinine, Ser: 0.98 mg/dL (ref 0.76–1.27)
GFR calc Af Amer: 92 mL/min/{1.73_m2} (ref >59)
GFR calc non Af Amer: 80 mL/min/{1.73_m2} (ref >59)
Globulin, Total: 2.2 g/dL (ref 1.5–4.5)
Glucose: 89 mg/dL (ref 65–99)
Potassium: 4.3 mmol/L (ref 3.5–5.2)
Sodium: 140 mmol/L (ref 134–144)
Total Protein: 6.9 g/dL (ref 6.0–8.5)

## 2016-12-03 LAB — CBC WITH DIFFERENTIAL/PLATELET
Basophils Absolute: 0 10*3/uL (ref 0.0–0.2)
Basos: 1 %
EOS (ABSOLUTE): 0.3 10*3/uL (ref 0.0–0.4)
Eos: 5 %
Hematocrit: 39.8 % (ref 37.5–51.0)
Hemoglobin: 13.6 g/dL (ref 13.0–17.7)
Immature Grans (Abs): 0 10*3/uL (ref 0.0–0.1)
Immature Granulocytes: 0 %
Lymphocytes Absolute: 1.5 10*3/uL (ref 0.7–3.1)
Lymphs: 31 %
MCH: 30.7 pg (ref 26.6–33.0)
MCHC: 34.2 g/dL (ref 31.5–35.7)
MCV: 90 fL (ref 79–97)
Monocytes Absolute: 0.5 10*3/uL (ref 0.1–0.9)
Monocytes: 11 %
Neutrophils Absolute: 2.4 10*3/uL (ref 1.4–7.0)
Neutrophils: 52 %
Platelets: 205 10*3/uL (ref 150–379)
RBC: 4.43 x10E6/uL (ref 4.14–5.80)
RDW: 13.2 % (ref 12.3–15.4)
WBC: 4.7 10*3/uL (ref 3.4–10.8)

## 2016-12-03 NOTE — Telephone Encounter (Signed)
Patient needs an appointment on Monday.  Please schedule him as the last patient of the day.

## 2016-12-03 NOTE — Telephone Encounter (Signed)
Would you send him a copy of his recent labs?

## 2016-12-03 NOTE — Telephone Encounter (Signed)
done

## 2016-12-04 NOTE — Telephone Encounter (Signed)
done

## 2016-12-08 NOTE — Progress Notes (Signed)
12/09/2016 5:01 PM   Tony Cox 11-18-1950 [id number]  Referring provider: Crecencio Mc, MD [place], [place] [address]  Chief Complaint  Patient presents with  . 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    Background history Tony Cox is a 65 year old [place] male who is status post robotic assisted cystectomy/prostatectomy with ileo conduit diversion performed by Dr. Ardath Sax the on 01/28/2013 for muscle invasive bladder cancer status post neoadjuvant it chemotherapy who presents today for follow up visit.  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  He has had one urinary tract infection positive for enterococcus and Escherichia coli  on 10/01/2014. He does have stoma pain associated with a hernia for which he uses a compression belt and takes gabapentin and tramadol.  The belt can become uncomfortable when worn for long periods of time.  He also have significant discomfort when he is standing for long periods of time.  He has not had recent fevers, chills, nausea or vomiting.  He is not having difficulty with the stroma or foul drainage.  He has recently had a series of urethral washing which have returned atypical urothelial cells, suspicious cells and more recently atypical urothelial cells on 01/18/2016.  He has been on a protocol of imaging every three months (patient driven) with Dr. Darcel Bayley.  There has not been evidence of reoccurrence or metastasis.    Patient underwent surgery with Dr. Darcel Bayley on 11/05/2016.  Pathology was benign.  He is doing well post operatively.  He is  having some mild swelling in the penis.  He has not had fevers, chills, nausea or vomiting.   Over the last few days, he has noticed a greenish color to his incision.  He is worried about infection.  He is controlling his post operative pain with Tramadol and 2000 mg of acetaminophen daily.    He was started on Keflex last week and the eschar has resolved.  He has been in contact with Dr. Darcel Bayley and has sent him images of his wound.  Dr. Darcel Bayley states that he is pleased with how he is recovering.  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  He is worried about his discharge.  He has not had fevers, chills, nausea or vomiting.      PMH: Past Medical History:  Diagnosis Date  . Cancer ([place]) 2015   T2 uroepithelial bladder carcinoma   . Depression   . GERD (gastroesophageal reflux disease)   . Heart murmur   . History of atrial fibrillation without current medication    POST OP MITRIAL VALVE REPLACEMENT IN 2004  . 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 Hyperlipidemia   . Hypertension     Surgical History: Past Surgical History:  Procedure Laterality Date  . CARDIAC CATHETERIZATION    . CATARACT EXTRACTION W/PHACO Left 11/20/2015   Procedure: CATARACT EXTRACTION PHACO AND INTRAOCULAR LENS PLACEMENT (IOC);  Surgeon: Gwyndolyn Saxon  Porfilio, MD;  Location: [facility] ORS;  Service: Ophthalmology;  Laterality: Left;  [place] AP% CDE fluid pack lot # [id number]  . CHOLECYSTECTOMY  2011  . COLONOSCOPY  2009, 2013   Dr Vassie Moment ADENOMA   . COLONOSCOPY WITH PROPOFOL N/A 07/24/2016   Procedure: COLONOSCOPY WITH PROPOFOL;  Surgeon: Robert Bellow, MD;  Location: [facility];  Service: Endoscopy;  Laterality: N/A;  . cystectomy  2014   ileal conduit diversion   . HERNIA REPAIR  6734   umbilical   . TONSILLECTOMY  AS CHILD  . TRANSTHORACIC ECHOCARDIOGRAM  11-03-2008   DR FATH [name])   LVF NORMAL/ EF 55-60%/ LEFT ATRIAL ENLARGEMENT/  ADEQUATELY FUNCTIONING MITRAL VALVE REPAIR/  MILD MR & TRI/  NO SIGNIFICANT CHANGE FROM PREVIOUS ECHO  . TRANSURETHRAL RESECTION OF BLADDER TUMOR N/A 07/27/2012   Procedure: TRANSURETHRAL RESECTION OF BLADDER TUMOR (TURBT);  Surgeon: Franchot Gallo, MD;  Location: [facility];  Service: Urology;  Laterality: N/A;  WITH MITOMYCIN INSTILLATION   . urethroptomy  11/05/2016   [place]  . valvuloplasty with replacement of cosgrove ring,1 congenital ruptur of cordi  2004   [facility]   mitral valve     Home Medications:  Allergies as of 12/09/2016      Reactions   Codeine Nausea And Vomiting, Hives      Medication List       Accurate as of 12/09/16  5:01 PM. Always use your most recent med list.          carvedilol 10 MG 24 hr capsule Commonly known as:  COREG CR Take 10 mg by mouth every evening.   cephALEXin 500 MG capsule Commonly known as:  KEFLEX Take 1 capsule (500 mg total) by mouth 2 (two) times daily.   Clindamycin Phosphate foam APPLY 1 GRAM ON SKIN DAILY   clobetasol ointment 0.05 % Commonly known as:  TEMOVATE Apply topically.   desonide 0.05 % lotion Commonly known as:  DESOWEN   dexlansoprazole 60 MG capsule Commonly known as:  DEXILANT Take 1 capsule (60 mg total) by mouth daily.   dicyclomine 10 MG capsule Commonly known as:  BENTYL Take 1 capsule (10 mg total) by mouth 2 (two) times daily.   gabapentin 300 MG capsule Commonly known as:  NEURONTIN Take 1 capsule (300 mg total) by mouth 3 (three) times daily.   hydrocortisone-pramoxine 2.5-1 % rectal cream Commonly known as:  ANALPRAM-HC APPLY A SMALL AMOUNT TWICE  A DAY AS NEEDED   mirtazapine 7.5 MG tablet Commonly known as:  REMERON Take 1 tablet (7.5 mg total) by mouth at bedtime.   nitrofurantoin (macrocrystal-monohydrate) 100 MG capsule Commonly known as:  MACROBID Take 1 capsule (100 mg total) by  mouth every 12 (twelve) hours.   polyethylene glycol packet Commonly known as:  MIRALAX / GLYCOLAX Take 17 g by mouth as needed.   polyethylene glycol powder powder Commonly known as:  GLYCOLAX/MIRALAX 255 grams one bottle for colonoscopy prep   PROBIOTIC & ACIDOPHILUS EX ST PO Take 2 tablets by mouth at bedtime as needed.   Vitamin D3 10000 units Tabs Take 1 tablet by mouth daily.       Allergies:  Allergies  Allergen Reactions  . Codeine Nausea And Vomiting and Hives    Family History: Family History  Problem Relation Age of Onset  . Adopted: Yes  . Colon cancer Mother   . Cancer Neg Hx        Prostate,Kidney,Bladder  . Prostate  cancer Neg Hx   . Bladder Cancer Neg Hx   . Kidney cancer Neg Hx     Social History:  reports that he quit smoking about 4 years ago. He has never used smokeless tobacco. He reports that he drinks about 0.6 oz of alcohol per week . He reports that he does not use drugs.  ROS:     Physical Exam: BP 115/74   Pulse 70   Ht 5\' 6"  (1.676 m)   Wt 147 lb 3.2 oz (66.8 kg)   BMI 23.76 kg/m   Constitutional: Well nourished. Alert and oriented, No acute distress. HEENT: [place] AT, moist mucus membranes. Trachea midline, no masses. Cardiovascular: No clubbing, cyanosis, or edema. Respiratory: Normal respiratory effort, no increased work of breathing. GI: Abdomen is soft, non tender, non distended, no abdominal masses. Liver and spleen not palpable.  Abdominal hernia appreciated.  Stool sample for occult testing is not indicated.  Stoma located in the right lower quadrant.  No rash.  It is pink and healthy.  Urine in drainage bag is clear yellow.   GU: No CVA tenderness.  No bladder fullness or masses.  Penis is uncircumcised.  No swelling in the coronal area on the ventral surface.  Incision is clean and dry.  No erythema.  A scant amount of serosanguinous fluid is seen on the gauze.   Skin: No rashes, bruises or suspicious lesions. Lymph: No  cervical or inguinal adenopathy. Neurologic: Grossly intact, no focal deficits, moving all 4 extremities. Psychiatric: Normal mood and affect.  Laboratory Data: Results for orders placed or performed in visit on 12/02/16  Comprehensive metabolic panel  Result Value Ref Range   Glucose 89 65 - 99 mg/dL   BUN 12 8 - 27 mg/dL   Creatinine, Ser 0.98 0.76 - 1.27 mg/dL   GFR calc non Af Amer 80 >59 mL/min/1.73   GFR calc Af Amer 92 >59 mL/min/1.73   BUN/Creatinine Ratio 12 10 - 24   Sodium 140 134 - 144 mmol/L   Potassium 4.3 3.5 - 5.2 mmol/L   Chloride 101 96 - 106 mmol/L   CO2 24 20 - 29 mmol/L   Calcium 9.2 8.6 - 10.2 mg/dL   Total Protein 6.9 6.0 - 8.5 g/dL   Albumin 4.7 3.6 - 4.8 g/dL   Globulin, Total 2.2 1.5 - 4.5 g/dL   Albumin/Globulin Ratio 2.1 1.2 - 2.2   Bilirubin Total 0.3 0.0 - 1.2 mg/dL   Alkaline Phosphatase 76 39 - 117 IU/L   AST 18 0 - 40 IU/L   ALT 16 0 - 44 IU/L  CBC with Differential/Platelet  Result Value Ref Range   WBC 4.7 3.4 - 10.8 x10E3/uL   RBC 4.43 4.14 - 5.80 x10E6/uL   Hemoglobin 13.6 13.0 - 17.7 g/dL   Hematocrit 39.8 37.5 - 51.0 %   MCV 90 79 - 97 fL   MCH 30.7 26.6 - 33.0 pg   MCHC 34.2 31.5 - 35.7 g/dL   RDW 13.2 12.3 - 15.4 %   Platelets 205 150 - 379 x10E3/uL   Neutrophils 52 Not Estab. %   Lymphs 31 Not Estab. %   Monocytes 11 Not Estab. %   Eos 5 Not Estab. %   Basos 1 Not Estab. %   Neutrophils Absolute 2.4 1.4 - 7.0 x10E3/uL   Lymphocytes Absolute 1.5 0.7 - 3.1 x10E3/uL   Monocytes Absolute 0.5 0.1 - 0.9 x10E3/uL   EOS (ABSOLUTE) 0.3 0.0 - 0.4 x10E3/uL  Basophils Absolute 0.0 0.0 - 0.2 x10E3/uL   Immature Granulocytes 0 Not Estab. %   Immature Grans (Abs) 0.0 0.0 - 0.1 x10E3/uL     Pertinent Imaging:  Assessment & Plan:   1. Bladder cancer:  Patient is status post robotic assisted cystectomy/prostatectomy with ileo conduit diversion performed by Dr. Ardath Sax the on 01/28/2013.  And now s/p urethrectomy by Dr. Darcel Bayley.  He is  being followed at [facility] in [place].   He is concerned that an infection may be occurring so we will refill his Keflex.  Dr. Darcel Bayley is pleased with how he is progressing.    2. Prostate cancer:   Patient is status post robotic assisted cystectomy/prostatectomy with ileo conduit diversion performed by Dr. Ardath Sax the on 01/28/2013.  He is being followed at [facility] in [place].     Return in about 1 week (around 12/16/2016) for exam.  Zara Council, [facility]  [facility] [address], [place], [place] [address] [phone]

## 2016-12-09 ENCOUNTER — Ambulatory Visit: Payer: Medicare Other | Admitting: Urology

## 2016-12-09 ENCOUNTER — Encounter: Payer: Self-pay | Admitting: Urology

## 2016-12-09 VITALS — BP 115/74 | HR 70 | Ht 66.0 in | Wt 147.2 lb

## 2016-12-09 DIAGNOSIS — C679 Malignant neoplasm of bladder, unspecified: Secondary | ICD-10-CM | POA: Diagnosis not present

## 2016-12-09 DIAGNOSIS — C61 Malignant neoplasm of prostate: Secondary | ICD-10-CM | POA: Diagnosis not present

## 2016-12-09 MED ORDER — CEPHALEXIN 500 MG PO CAPS: 500.0000 mg | ORAL_CAPSULE | Freq: Two times a day (BID) | ORAL | 0 refills | Status: DC

## 2016-12-12 ENCOUNTER — Encounter: Payer: Self-pay | Admitting: General Surgery

## 2016-12-12 ENCOUNTER — Ambulatory Visit (INDEPENDENT_AMBULATORY_CARE_PROVIDER_SITE_OTHER): Payer: Medicare Other | Admitting: General Surgery

## 2016-12-12 VITALS — Resp 12 | Ht 66.0 in | Wt 145.0 lb

## 2016-12-12 DIAGNOSIS — C680 Malignant neoplasm of urethra: Secondary | ICD-10-CM

## 2016-12-12 NOTE — Progress Notes (Signed)
Patient ID: Tony Cox, male   DOB: 1950/08/11, 66 y.o.   MRN: [id number]  Chief Complaint  Patient presents with  . Other    HPI Tony Cox is a 66 y.o. male here today for a discission Regarding his recently completed urethral resection completed at [facility] in [place].Marland Kitchen  HPI  Past Medical History:  Diagnosis Date  . Cancer ([place]) 2015   T2 uroepithelial bladder carcinoma   . Depression   . GERD (gastroesophageal reflux disease)   . Heart murmur   . History of atrial fibrillation without current medication    POST OP MITRIAL VALVE REPLACEMENT IN 2004  . 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 Hyperlipidemia   . Hypertension     Past Surgical History:  Procedure Laterality Date  . CARDIAC CATHETERIZATION    . CATARACT EXTRACTION W/PHACO Left 11/20/2015   Procedure: CATARACT EXTRACTION PHACO AND INTRAOCULAR LENS PLACEMENT (IOC);  Surgeon: Birder Robson, MD;  Location: [facility] ORS;  Service: Ophthalmology;  Laterality: Left;  [place] AP% CDE fluid pack lot # [id number]  . CHOLECYSTECTOMY  2011  . COLONOSCOPY  2009, 2013   Dr Vassie Moment ADENOMA   . COLONOSCOPY WITH PROPOFOL N/A 07/24/2016   Procedure: COLONOSCOPY WITH PROPOFOL;  Surgeon: Robert Bellow, MD;  Location: [facility] ENDOSCOPY;  Service: Endoscopy;  Laterality: N/A;  . cystectomy  2014   ileal conduit diversion   . HERNIA REPAIR  4431   umbilical   . TONSILLECTOMY  AS CHILD  . TRANSTHORACIC ECHOCARDIOGRAM  11-03-2008  DR FATH Tony Rabon)   LVF NORMAL/ EF 55-60%/ LEFT ATRIAL ENLARGEMENT/  ADEQUATELY FUNCTIONING MITRAL VALVE REPAIR/  MILD MR & TRI/  NO SIGNIFICANT CHANGE FROM PREVIOUS ECHO  . TRANSURETHRAL RESECTION OF BLADDER TUMOR N/A 07/27/2012   Procedure: TRANSURETHRAL RESECTION OF BLADDER TUMOR (TURBT);  Surgeon: Franchot Gallo, MD;  Location: [facility];  Service: Urology;  Laterality: N/A;  WITH MITOMYCIN INSTILLATION   . urethroptomy  11/05/2016   [place]  . valvuloplasty with replacement of cosgrove ring,1 congenital ruptur of cordi  2004   [facility]   mitral valve     Family History  Problem Relation Age of Onset  . Adopted: Yes  . Colon cancer Mother   . Cancer Neg Hx        Prostate,Kidney,Bladder  . Prostate cancer Neg Hx   . Bladder Cancer Neg Hx   . Kidney cancer Neg Hx     Social History Social History  Substance Use Topics  . Smoking status: Former Smoker    Quit date: 04/22/2012  . Smokeless tobacco: Never Used  . Alcohol use 0.6 oz/week    1 Standard drinks or equivalent per week     Comment: occasional    Allergies  Allergen Reactions  . Codeine Nausea And Vomiting and Hives    Current Outpatient Prescriptions  Medication Sig Dispense Refill  . carvedilol (COREG CR) 10 MG 24 hr capsule Take 10 mg by mouth every evening.    . cephALEXin (KEFLEX) 500 MG capsule Take 1 capsule (500 mg total) by mouth 2 (two) times daily. 14 capsule 0  . Cholecalciferol (VITAMIN D3) 10000 units TABS Take 1 tablet by mouth daily.    . Clindamycin Phosphate foam APPLY 1 GRAM ON SKIN DAILY  5  . clobetasol ointment (TEMOVATE) 0.05 % Apply  topically.    Marland Kitchen desonide (DESOWEN) 0.05 % lotion     . dexlansoprazole (DEXILANT) 60 MG capsule Take 1 capsule (60 mg total) by mouth daily. 30 capsule 12  . dicyclomine (BENTYL) 10 MG capsule Take 1 capsule (10 mg total) by mouth 2 (two) times daily. 180 capsule 3  . gabapentin (NEURONTIN) 300 MG capsule Take 1 capsule (300 mg total) by mouth 3 (three) times daily. 270 capsule 3  . hydrocortisone-pramoxine (ANALPRAM-HC) 2.5-1 % rectal cream APPLY A SMALL AMOUNT TWICE  A DAY AS NEEDED 30 g 0  . mirtazapine (REMERON) 7.5 MG tablet Take 1 tablet (7.5 mg total) by mouth at bedtime. 90 tablet 3  . nitrofurantoin, macrocrystal-monohydrate, (MACROBID) 100 MG capsule Take 1 capsule (100  mg total) by mouth every 12 (twelve) hours. 14 capsule 0  . polyethylene glycol (MIRALAX / GLYCOLAX) packet Take 17 g by mouth as needed.     . polyethylene glycol powder (GLYCOLAX/MIRALAX) powder 255 grams one bottle for colonoscopy prep 255 g 0  . Probiotic Product (PROBIOTIC & ACIDOPHILUS EX ST PO) Take 2 tablets by mouth at bedtime as needed.     No current facility-administered medications for this visit.     Review of Systems Review of Systems  Constitutional: Negative.   Respiratory: Negative.   Cardiovascular: Negative.     Resp. rate 12, height 5\' 6"  (1.676 m), weight 145 lb (65.8 kg).  Physical Exam Physical Exam  Genitourinary:          Data Reviewed Urethral biopsy pathology not available. Urethral resection pathology showed no residual carcinoma.  Assessment    Study progress post resection of the urethra for a small  foci of cancer.    Plan    Follow-up as needed       Robert Bellow 12/13/2016, 9:53 AM

## 2016-12-13 DIAGNOSIS — C680 Malignant neoplasm of urethra: Secondary | ICD-10-CM | POA: Insufficient documentation

## 2016-12-18 ENCOUNTER — Encounter: Payer: Self-pay | Admitting: Urology

## 2016-12-18 ENCOUNTER — Ambulatory Visit (INDEPENDENT_AMBULATORY_CARE_PROVIDER_SITE_OTHER): Payer: Medicare Other | Admitting: Urology

## 2016-12-18 VITALS — BP 112/71 | HR 65 | Ht 66.0 in | Wt 146.7 lb

## 2016-12-18 DIAGNOSIS — C679 Malignant neoplasm of bladder, unspecified: Secondary | ICD-10-CM

## 2016-12-18 DIAGNOSIS — C61 Malignant neoplasm of prostate: Secondary | ICD-10-CM | POA: Diagnosis not present

## 2016-12-18 MED ORDER — CEPHALEXIN 500 MG PO CAPS: 500.0000 mg | ORAL_CAPSULE | Freq: Two times a day (BID) | ORAL | 0 refills | Status: DC

## 2016-12-18 NOTE — Progress Notes (Signed)
12/18/2016 4:48 PM   [name] 10/10/1950 [id number]  Referring provider: Crecencio Mc, MD [place], [place] [address]  Chief Complaint  Patient presents with  . 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    Background history Mr. Verdi is a 66 year old [place] male who is status post robotic assisted cystectomy/prostatectomy with ileo conduit diversion performed by Dr. Ardath Sax the on 01/28/2013 for muscle invasive bladder cancer status post neoadjuvant it chemotherapy who presents today for follow up visit.  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  He has had one urinary tract infection positive for enterococcus and Escherichia coli  on 10/01/2014. He does have stoma pain associated with a hernia for which he uses a compression belt and takes gabapentin and tramadol.  The belt can become uncomfortable when worn for long periods of time.  He also have significant discomfort when he is standing for long periods of time.  He has not had recent fevers, chills, nausea or vomiting.  He is not having difficulty with the stroma or foul drainage.  He has recently had a series of urethral washing which have returned atypical urothelial cells, suspicious cells and more recently atypical urothelial cells on 01/18/2016.  He has been on a protocol of imaging every three months (patient driven) with Dr. Darcel Bayley.  There has not been evidence of reoccurrence or metastasis.    Patient underwent surgery with Dr. Darcel Bayley on 11/05/2016.  Pathology was benign.  He is doing well post operatively.  He  is having some mild swelling in the penis.  He has not had fevers, chills, nausea or vomiting.   Over the last few days, he has noticed a greenish color to his incision.  He is worried about infection.  He is controlling his post operative pain with Tramadol and 2000 mg of acetaminophen daily.    He was started on Keflex last week and the eschar has resolved.  He has been in contact with Dr. Darcel Bayley and has sent him images of his wound.  Dr. Darcel Bayley states that he is pleased with how he is recovering.  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  He is worried about his discharge.  He has not had fevers, chills, nausea or vomiting.    Today, he states he is feeling better.  He has more energy.  He feels that the foreskin is looking more normal.  He has not had discharge.  He has not had fevers, chills, nausea or vomiting.     PMH: Past Medical History:  Diagnosis Date  . Cancer ([place]) 2015   T2 uroepithelial bladder carcinoma   . Depression   . GERD (gastroesophageal reflux disease)   . Heart murmur   . History of atrial fibrillation without current medication    POST OP MITRIAL VALVE REPLACEMENT IN 2004  . 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 Hyperlipidemia   . Hypertension  Surgical History: Past Surgical History:  Procedure Laterality Date  . CARDIAC CATHETERIZATION    . CATARACT EXTRACTION W/PHACO Left 11/20/2015   Procedure: CATARACT EXTRACTION PHACO AND INTRAOCULAR LENS PLACEMENT (IOC);  Surgeon: Birder Robson, MD;  Location: [facility] ORS;  Service: Ophthalmology;  Laterality: Left;  [place] AP% CDE fluid pack lot # [id number]  . CHOLECYSTECTOMY  2011  . COLONOSCOPY  2009, 2013   Dr Vassie Moment ADENOMA   . COLONOSCOPY WITH PROPOFOL N/A 07/24/2016   Procedure: COLONOSCOPY WITH PROPOFOL;  Surgeon: Robert Bellow, MD;  Location: [facility] ENDOSCOPY;   Service: Endoscopy;  Laterality: N/A;  . cystectomy  2014   ileal conduit diversion   . HERNIA REPAIR  3295   umbilical   . TONSILLECTOMY  AS CHILD  . TRANSTHORACIC ECHOCARDIOGRAM  11-03-2008  DR FATH [name])   LVF NORMAL/ EF 55-60%/ LEFT ATRIAL ENLARGEMENT/  ADEQUATELY FUNCTIONING MITRAL VALVE REPAIR/  MILD MR & TRI/  NO SIGNIFICANT CHANGE FROM PREVIOUS ECHO  . TRANSURETHRAL RESECTION OF BLADDER TUMOR N/A 07/27/2012   Procedure: TRANSURETHRAL RESECTION OF BLADDER TUMOR (TURBT);  Surgeon: Franchot Gallo, MD;  Location: [facility];  Service: Urology;  Laterality: N/A;  WITH MITOMYCIN INSTILLATION   . urethroptomy  11/05/2016   [place]  . valvuloplasty with replacement of cosgrove ring,1 congenital ruptur of cordi  2004   [facility]   mitral valve     Home Medications:  Allergies as of 12/18/2016      Reactions   Codeine Nausea And Vomiting, Hives      Medication List       Accurate as of 12/18/16  4:48 PM. Always use your most recent med list.          carvedilol 10 MG 24 hr capsule Commonly known as:  COREG CR Take 10 mg by mouth every evening.   cephALEXin 500 MG capsule Commonly known as:  KEFLEX Take 1 capsule (500 mg total) by mouth 2 (two) times daily.   Clindamycin Phosphate foam APPLY 1 GRAM ON SKIN DAILY   clobetasol ointment 0.05 % Commonly known as:  TEMOVATE Apply topically.   desonide 0.05 % lotion Commonly known as:  DESOWEN   dexlansoprazole 60 MG capsule Commonly known as:  DEXILANT Take 1 capsule (60 mg total) by mouth daily.   dicyclomine 10 MG capsule Commonly known as:  BENTYL Take 1 capsule (10 mg total) by mouth 2 (two) times daily.   gabapentin 300 MG capsule Commonly known as:  NEURONTIN Take 1 capsule (300 mg total) by mouth 3 (three) times daily.   hydrocortisone-pramoxine 2.5-1 % rectal cream Commonly known as:  ANALPRAM-HC APPLY A SMALL AMOUNT TWICE  A DAY AS NEEDED   mirtazapine 7.5 MG  tablet Commonly known as:  REMERON Take 1 tablet (7.5 mg total) by mouth at bedtime.   nitrofurantoin (macrocrystal-monohydrate) 100 MG capsule Commonly known as:  MACROBID Take 1 capsule (100 mg total) by mouth every 12 (twelve) hours.   polyethylene glycol packet Commonly known as:  MIRALAX / GLYCOLAX Take 17 g by mouth as needed.   polyethylene glycol powder powder Commonly known as:  GLYCOLAX/MIRALAX 255 grams one bottle for colonoscopy prep   PROBIOTIC & ACIDOPHILUS EX ST PO Take 2 tablets by mouth at bedtime as needed.   Vitamin D3 10000 units Tabs Take 1 tablet by mouth daily.            Discharge Care Instructions        Start  Ordered   12/18/16 0000  cephALEXin (KEFLEX) 500 MG capsule  2 times daily    Question:  Supervising Provider  Answer:  Hollice Espy   12/18/16 1635      Allergies:  Allergies  Allergen Reactions  . Codeine Nausea And Vomiting and Hives    Family History: Family History  Problem Relation Age of Onset  . Adopted: Yes  . Colon cancer Mother   . Cancer Neg Hx        Prostate,Kidney,Bladder  . Prostate cancer Neg Hx   . Bladder Cancer Neg Hx   . Kidney cancer Neg Hx     Social History:  reports that he quit smoking about 4 years ago. He has never used smokeless tobacco. He reports that he drinks about 0.6 oz of alcohol per week . He reports that he does not use drugs.  ROS: UROLOGY Frequent Urination?: No Hard to postpone urination?: No Burning/pain with urination?: No Get up at night to urinate?: No Leakage of urine?: No Urine stream starts and stops?: No Trouble starting stream?: No Do you have to strain to urinate?: No Blood in urine?: No Urinary tract infection?: No Sexually transmitted disease?: No Injury to kidneys or bladder?: No Painful intercourse?: No Weak stream?: No Erection problems?: No Penile pain?: No Gastrointestinal Nausea?: No Vomiting?: No Indigestion/heartburn?: No Diarrhea?:  No Constipation?: No Constitutional Fever: No Night sweats?: No Weight loss?: No Fatigue?: No Skin Skin rash/lesions?: No Itching?: No Eyes Blurred vision?: No Double vision?: No Ears/Nose/Throat Sore throat?: No Sinus problems?: No Hematologic/Lymphatic Swollen glands?: No Easy bruising?: No Cardiovascular Leg swelling?: No Chest pain?: No Respiratory Cough?: No Shortness of breath?: No Endocrine Excessive thirst?: No Musculoskeletal Back pain?: No Joint pain?: No Neurological Headaches?: No Dizziness?: No Psychologic Depression?: No Anxiety?: No   Physical Exam: BP 112/71   Pulse 65   Ht 5\' 6"  (1.676 m)   Wt 146 lb 11.2 oz (66.5 kg)   BMI 23.68 kg/m   Constitutional: Well nourished. Alert and oriented, No acute distress. HEENT: [place] AT, moist mucus membranes. Trachea midline, no masses. Cardiovascular: No clubbing, cyanosis, or edema. Respiratory: Normal respiratory effort, no increased work of breathing. GI: Abdomen is soft, non tender, non distended, no abdominal masses. Liver and spleen not palpable.  Abdominal hernia appreciated.  Stool sample for occult testing is not indicated.  Stoma located in the right lower quadrant.  No rash.  It is pink and healthy.  Urine in drainage bag is clear yellow.   GU: No CVA tenderness.  No bladder fullness or masses.  Penis is uncircumcised.  No swelling in the coronal area on the ventral surface.  Incision is clean and dry.  No erythema.  Healthy granulation tissue is in the meatus. Skin: No rashes, bruises or suspicious lesions. Lymph: No cervical or inguinal adenopathy. Neurologic: Grossly intact, no focal deficits, moving all 4 extremities. Psychiatric: Normal mood and affect.  Laboratory Data: Results for orders placed or performed in visit on 12/02/16  Comprehensive metabolic panel  Result Value Ref Range   Glucose 89 65 - 99 mg/dL   BUN 12 8 - 27 mg/dL   Creatinine, Ser 0.98 0.76 - 1.27 mg/dL   GFR calc  non Af Amer 80 >59 mL/min/1.73   GFR calc Af Amer 92 >59 mL/min/1.73   BUN/Creatinine Ratio 12 10 - 24   Sodium 140 134 - 144 mmol/L   Potassium 4.3 3.5 - 5.2 mmol/L   Chloride 101 96 - 106 mmol/L  CO2 24 20 - 29 mmol/L   Calcium 9.2 8.6 - 10.2 mg/dL   Total Protein 6.9 6.0 - 8.5 g/dL   Albumin 4.7 3.6 - 4.8 g/dL   Globulin, Total 2.2 1.5 - 4.5 g/dL   Albumin/Globulin Ratio 2.1 1.2 - 2.2   Bilirubin Total 0.3 0.0 - 1.2 mg/dL   Alkaline Phosphatase 76 39 - 117 IU/L   AST 18 0 - 40 IU/L   ALT 16 0 - 44 IU/L  CBC with Differential/Platelet  Result Value Ref Range   WBC 4.7 3.4 - 10.8 x10E3/uL   RBC 4.43 4.14 - 5.80 x10E6/uL   Hemoglobin 13.6 13.0 - 17.7 g/dL   Hematocrit 39.8 37.5 - 51.0 %   MCV 90 79 - 97 fL   MCH 30.7 26.6 - 33.0 pg   MCHC 34.2 31.5 - 35.7 g/dL   RDW 13.2 12.3 - 15.4 %   Platelets 205 150 - 379 x10E3/uL   Neutrophils 52 Not Estab. %   Lymphs 31 Not Estab. %   Monocytes 11 Not Estab. %   Eos 5 Not Estab. %   Basos 1 Not Estab. %   Neutrophils Absolute 2.4 1.4 - 7.0 x10E3/uL   Lymphocytes Absolute 1.5 0.7 - 3.1 x10E3/uL   Monocytes Absolute 0.5 0.1 - 0.9 x10E3/uL   EOS (ABSOLUTE) 0.3 0.0 - 0.4 x10E3/uL   Basophils Absolute 0.0 0.0 - 0.2 x10E3/uL   Immature Granulocytes 0 Not Estab. %   Immature Grans (Abs) 0.0 0.0 - 0.1 x10E3/uL   I have reviewed the labs  Pertinent Imaging:  Assessment & Plan:   1. Bladder cancer:  Patient is status post robotic assisted cystectomy/prostatectomy with ileo conduit diversion performed by Dr. Ardath Sax the on 01/28/2013.  And now s/p urethrectomy by Dr. Darcel Bayley.  He is being followed at [facility] in [place].   He is concerned that an infection may re occur so we will refill his Keflex.  Dr. Darcel Bayley is pleased with how he is progressing.  Advised to move the foreskin back and forth to keep it pliable.    2. Prostate cancer:   Patient is status post robotic assisted cystectomy/prostatectomy with ileo conduit diversion  performed by Dr. Ardath Sax the on 01/28/2013.  He is being followed at [facility] in [place].     Return in about 1 week (around 12/25/2016) for recheck.  Zara Council, [facility] [address], [place], [place] [address] [phone]

## 2017-01-20 NOTE — Telephone Encounter (Signed)
Error

## 2017-01-23 ENCOUNTER — Telehealth: Payer: Self-pay | Admitting: Internal Medicine

## 2017-01-23 NOTE — Telephone Encounter (Signed)
Pt declined AWV. °

## 2017-01-27 ENCOUNTER — Ambulatory Visit (INDEPENDENT_AMBULATORY_CARE_PROVIDER_SITE_OTHER): Payer: Medicare Other

## 2017-01-27 ENCOUNTER — Encounter: Payer: Self-pay | Admitting: Podiatry

## 2017-01-27 ENCOUNTER — Ambulatory Visit (INDEPENDENT_AMBULATORY_CARE_PROVIDER_SITE_OTHER): Payer: Medicare Other | Admitting: Podiatry

## 2017-01-27 DIAGNOSIS — L603 Nail dystrophy: Secondary | ICD-10-CM

## 2017-01-27 DIAGNOSIS — M722 Plantar fascial fibromatosis: Secondary | ICD-10-CM

## 2017-01-27 DIAGNOSIS — G4700 Insomnia, unspecified: Secondary | ICD-10-CM | POA: Insufficient documentation

## 2017-01-27 MED ORDER — METHYLPREDNISOLONE 4 MG PO TBPK: ORAL_TABLET | ORAL | 0 refills | Status: DC

## 2017-01-27 MED ORDER — NAFTIFINE HCL 2 % EX CREA: 1.0000 [drp] | TOPICAL_CREAM | CUTANEOUS | 2 refills | Status: DC

## 2017-01-27 NOTE — Patient Instructions (Signed)

## 2017-01-27 NOTE — Progress Notes (Signed)
Subjective:  Patient ID: Tony Cox, male    DOB: 1951-01-23,  MRN: [id number] HPI Chief Complaint  Patient presents with  . Foot Pain    Plantar heel right     66 y.o. male presents with the above complaint.   Presents complaining of heel pain times the past several months to the right heel. He states is painful after prolonged time and particularly in the morning when he first. He is also complaining of a rash and plantar aspect of the foot as well as a thickened toenail hallux right. These around for quite a while and states. He has a history of bladder cancer.  Past Medical History:  Diagnosis Date  . Cancer ([place]) 2015   T2 uroepithelial bladder carcinoma   . Depression   . GERD (gastroesophageal reflux disease)   . Heart murmur   . History of atrial fibrillation without current medication    POST OP MITRIAL VALVE REPLACEMENT IN 2004  . 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 Hyperlipidemia   . Hypertension    Past Surgical History:  Procedure Laterality Date  . CARDIAC CATHETERIZATION    . CATARACT EXTRACTION W/PHACO Left 11/20/2015   Procedure: CATARACT EXTRACTION PHACO AND INTRAOCULAR LENS PLACEMENT (IOC);  Surgeon: Birder Robson, MD;  Location: [facility] ORS;  Service: Ophthalmology;  Laterality: Left;  [place] AP% CDE fluid pack lot # [id number]  . CHOLECYSTECTOMY  2011  . COLONOSCOPY  2009, 2013   Dr Vassie Moment ADENOMA   . COLONOSCOPY WITH PROPOFOL N/A 07/24/2016   Procedure: COLONOSCOPY WITH PROPOFOL;  Surgeon: Robert Bellow, MD;  Location[facility] ENDOSCOPY;  Service: Endoscopy;  Laterality: N/A;  . cystectomy  2014   ileal conduit diversion   . HERNIA REPAIR  1478   umbilical   . TONSILLECTOMY  AS CHILD  . TRANSTHORACIC ECHOCARDIOGRAM  11-03-2008  DR FATH [name])   LVF NORMAL/ EF 55-60%/ LEFT ATRIAL ENLARGEMENT/  ADEQUATELY FUNCTIONING  MITRAL VALVE REPAIR/  MILD MR & TRI/  NO SIGNIFICANT CHANGE FROM PREVIOUS ECHO  . TRANSURETHRAL RESECTION OF BLADDER TUMOR N/A 07/27/2012   Procedure: TRANSURETHRAL RESECTION OF BLADDER TUMOR (TURBT);  Surgeon: Franchot Gallo, MD;  Location: [facility];  Service: Urology;  Laterality: N/A;  WITH MITOMYCIN INSTILLATION   . urethroptomy  11/05/2016   [place]  . valvuloplasty with replacement of cosgrove ring,1 congenital ruptur of cordi  2004   [facility]   mitral valve     Current Outpatient Prescriptions:  .  carvedilol (COREG CR) 10 MG 24 hr capsule, Take 10 mg by mouth every evening., Disp: , Rfl:  .  cephALEXin (KEFLEX) 500 MG capsule, Take 1 capsule (500 mg total) by mouth 2 (two) times daily., Disp: 14 capsule, Rfl: 0 .  Cholecalciferol (VITAMIN D3) 10000 units TABS, Take 1 tablet by mouth daily., Disp: , Rfl:  .  Clindamycin Phosphate foam, APPLY 1 GRAM ON SKIN DAILY, Disp: , Rfl: 5 .  clobetasol ointment (TEMOVATE) 0.05 %, Apply topically., Disp: , Rfl:  .  desonide (DESOWEN) 0.05 % lotion, , Disp: , Rfl:  .  dexlansoprazole (DEXILANT) 60 MG capsule, Take 1 capsule (60 mg total) by mouth daily., Disp: 30 capsule, Rfl: 12 .  dicyclomine (BENTYL) 10 MG capsule, Take 1 capsule (10 mg total) by mouth 2 (two) times daily., Disp: 180 capsule, Rfl: 3 .  Flaxseed,  Linseed, (FLAX SEED OIL) 1000 MG CAPS, Take by mouth., Disp: , Rfl:  .  gabapentin (NEURONTIN) 300 MG capsule, Take 1 capsule (300 mg total) by mouth 3 (three) times daily., Disp: 270 capsule, Rfl: 3 .  hydrocortisone-pramoxine (ANALPRAM-HC) 2.5-1 % rectal cream, APPLY A SMALL AMOUNT TWICE  A DAY AS NEEDED, Disp: 30 g, Rfl: 0 .  mirtazapine (REMERON) 7.5 MG tablet, Take 1 tablet (7.5 mg total) by mouth at bedtime., Disp: 90 tablet, Rfl: 3 .  nitrofurantoin, macrocrystal-monohydrate, (MACROBID) 100 MG capsule, Take 1 capsule (100 mg total) by mouth every 12 (twelve) hours. (Patient not taking: Reported on  12/18/2016), Disp: 14 capsule, Rfl: 0 .  polyethylene glycol (MIRALAX / GLYCOLAX) packet, Take 17 g by mouth as needed. , Disp: , Rfl:  .  polyethylene glycol powder (GLYCOLAX/MIRALAX) powder, 255 grams one bottle for colonoscopy prep (Patient not taking: Reported on 12/18/2016), Disp: 255 g, Rfl: 0 .  Probiotic Product (PROBIOTIC & ACIDOPHILUS EX ST PO), Take 2 tablets by mouth at bedtime as needed., Disp: , Rfl:   Allergies  Allergen Reactions  . Codeine Nausea And Vomiting and Hives   Review of Systems  All other systems reviewed and are negative.  Objective:  There were no vitals filed for this visit.  General: Well developed, nourished, in no acute distress, alert and oriented x3   Dermatological: Skin is warm, dry and supple bilateral. Nails x 10 are well maintained; remaining integument appears unremarkable at this time. There are no open sores, no preulcerative lesions, no rash or signs of infection present.He demonstrates thick dystrophic and possibly mycotic nail hallux right. There is surrounding soft tissue petechial lesions with multiple vesicular lesions consistent with athlete's foot. He does have this in the medial longitudinal arch is well there is no purulence there is no malodor. No signs of bacterial infection.  Vascular: Dorsalis Pedis artery and Posterior Tibial artery pedal pulses are 2/4 bilateral with immedate capillary fill time. Pedal hair growth present. No varicosities and no lower extremity edema present bilateral.   Neruologic: Grossly intact via light touch bilateral. Vibratory intact via tuning fork bilateral. Protective threshold with Semmes Wienstein monofilament intact to all pedal sites bilateral. Patellar and Achilles deep tendon reflexes 2+ bilateral. No Babinski or clonus noted bilateral.   Musculoskeletal: No gross boney pedal deformities bilateral. No pain, crepitus, or limitation noted with foot and ankle range of motion bilateral. Muscular strength 5/5  in all groups tested bilateral. He has no range of motion at the first metatarsophalangeal joint of the right foot. His foot appears to be rectus but does have considerable pain on palpation of the medial opinion tubercle and plantar fascia calcaneal insertion site. There is warm and swollen. He has no pain medial and lateral compression.  Gait: Unassisted, antalgic gait right side.   Radiographs:  3 views of the right foot demonstrates osseously mature individual with good bone stock. No fractures are identified. Very small calcaneal heel spur. It appears to be old. Plantar fascia does demonstrate soft tissue thickening at the insertion site on the calcaneus. Complete joint space loss first metatarsophalangeal joint right. Subchondral sclerosis flattening of the head and flattening of the base of the adjoining bones at the joint. Dorsal spurring is noted on lateral view.  Assessment & Plan:   Assessment: Plantar fasciitis right foot. Nail dystrophy hallux right. Tinea pedis right foot.  Plan: We discussed the etiology pathology conservative versus surgical therapies. The assembled the nail today to be sent  for pathologic evaluation. Also injected his right heel today with a local anesthetic started him on a Medrol taper pack. I dispensed a plantar fascia brace discussed appropriate shoe gear stretching exercises and ice therapy and I will follow-up with him in 1 month.     Agostino Gorin T. [place], [place]

## 2017-02-19 ENCOUNTER — Other Ambulatory Visit: Payer: Self-pay | Admitting: Internal Medicine

## 2017-02-24 ENCOUNTER — Ambulatory Visit: Payer: Medicare Other | Admitting: Podiatry

## 2017-03-03 ENCOUNTER — Ambulatory Visit (INDEPENDENT_AMBULATORY_CARE_PROVIDER_SITE_OTHER): Payer: Medicare Other | Admitting: Podiatry

## 2017-03-03 ENCOUNTER — Encounter: Payer: Self-pay | Admitting: Podiatry

## 2017-03-03 DIAGNOSIS — M722 Plantar fascial fibromatosis: Secondary | ICD-10-CM | POA: Diagnosis not present

## 2017-03-03 NOTE — Progress Notes (Signed)
He presents today for follow-up of his plantar fasciitis of his right heel and onychomycosis. Pathology report is back today will be discussed. He states that his plantar fasciitis is much improved and he continues to take his meloxicam regularly.  Objective: Vital signs are stable he is alert and oriented 3. Pulses are palpable. Neurologic sensorium is intact. Deep tendon reflexes are intact muscle strength is normal. Pathology report does demonstrate onychomycosis probable saprophyte's. He has no reproducible tenderness on palpation medial continue tubercle today.  Assessment: Plantar fasciitis right foot. Onychomycosis right foot.  Plan: Offered him oral antifungals he states that he would like to start this after the holiday season. He is also going more blood work done while he is in [place] next week he will continue the use of the meloxicam.

## 2017-03-23 ENCOUNTER — Other Ambulatory Visit: Payer: Self-pay | Admitting: Urology

## 2017-03-23 MED ORDER — NYSTATIN 100000 UNIT/GM EX POWD: Freq: Four times a day (QID) | CUTANEOUS | 4 refills | Status: DC

## 2017-03-23 NOTE — Progress Notes (Signed)
Nystatin powder prescription sent to pharmacy.

## 2017-04-02 ENCOUNTER — Ambulatory Visit: Payer: Medicare Other | Admitting: Podiatry

## 2017-04-02 ENCOUNTER — Ambulatory Visit (INDEPENDENT_AMBULATORY_CARE_PROVIDER_SITE_OTHER): Payer: Medicare Other

## 2017-04-02 ENCOUNTER — Encounter: Payer: Self-pay | Admitting: Podiatry

## 2017-04-02 DIAGNOSIS — S9031XA Contusion of right foot, initial encounter: Secondary | ICD-10-CM

## 2017-04-02 DIAGNOSIS — S93601A Unspecified sprain of right foot, initial encounter: Secondary | ICD-10-CM | POA: Diagnosis not present

## 2017-04-02 NOTE — Progress Notes (Signed)
He presents today with a 2-week duration of pain to his right foot.  He states I feel like it cracked the bone.  He states that the foot is swollen and painful and he has been wrapping it with co-band or an Ace bandage.  He has really been busy over the past couple weeks and has not had the time to dedicate up for being off of it.  Objective: Vital signs are stable he is alert oriented x3.  Pulses are palpable.  Has mild edema to the right foot with tenderness on frontal plane range of motion particularly in Lisfranc's area.  He has pain on direct palpation of the tarsometatarsal joints as well as a mild sinus tarsitis of the right foot.  Radiographs do not demonstrate any type of major osseous abnormalities on 3 views of the right foot.  Assessment: Sprained right foot midfoot.  Plan: Place him in a compression ankle at and a Tri-Lock brace he is to wear appropriate shoe gear which was discussed with him thoroughly today and I will follow-up with him in a couple of weeks.

## 2017-04-17 ENCOUNTER — Other Ambulatory Visit: Payer: Self-pay

## 2017-04-17 MED ORDER — MELOXICAM 15 MG PO TABS: 15.0000 mg | ORAL_TABLET | Freq: Every day | ORAL | 3 refills | Status: DC

## 2017-04-17 NOTE — Telephone Encounter (Signed)
Pharmacy refill request for Meloxicam.  Per Dr. Milinda Pointer, ok to refill.  Rx has been sent to pharmacy

## 2017-05-07 ENCOUNTER — Encounter: Payer: Self-pay | Admitting: Podiatry

## 2017-05-07 ENCOUNTER — Ambulatory Visit (INDEPENDENT_AMBULATORY_CARE_PROVIDER_SITE_OTHER): Payer: Medicare Other | Admitting: Podiatry

## 2017-05-07 ENCOUNTER — Ambulatory Visit (INDEPENDENT_AMBULATORY_CARE_PROVIDER_SITE_OTHER): Payer: Medicare Other

## 2017-05-07 ENCOUNTER — Ambulatory Visit: Payer: Medicare Other | Admitting: Podiatry

## 2017-05-07 DIAGNOSIS — S93601D Unspecified sprain of right foot, subsequent encounter: Secondary | ICD-10-CM | POA: Diagnosis not present

## 2017-05-07 DIAGNOSIS — M7751 Other enthesopathy of right foot: Secondary | ICD-10-CM | POA: Diagnosis not present

## 2017-05-07 DIAGNOSIS — G5791 Unspecified mononeuropathy of right lower limb: Secondary | ICD-10-CM | POA: Diagnosis not present

## 2017-05-07 DIAGNOSIS — M779 Enthesopathy, unspecified: Principal | ICD-10-CM

## 2017-05-07 DIAGNOSIS — M778 Other enthesopathies, not elsewhere classified: Secondary | ICD-10-CM

## 2017-05-07 NOTE — Progress Notes (Signed)
He presents today states that his plantar fasciitis has resolved.  He continues to take his meloxicam.  Is that he feels that he may have laced his shoes too tightly as he is trying to provide support for his foot.  He states that he has severe pain across the top of the foot radiating down the medial aspect of the right foot.  Objective: Vital signs are stable alert and oriented x3.  Pulses are palpable.  He has pain on palpation of the deep peroneal nerve as well as the medial dorsal cutaneous nerve.  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  Plan: 2 injections today 1 across the top of the foot one down the medial aspect of the foot both consisting of 20 mg of Kenalog 5 mg of Marcaine injected after sterile Betadine skin prep overlying the point of maximal tenderness.  He will continue his meloxicam follow-up with me in

## 2017-05-11 NOTE — Progress Notes (Signed)
05/12/2017 12:47 AM   Tony Cox [birth year] [id number]  Referring provider: Crecencio Mc, MD [place], [place] [address]  Chief Complaint  Patient presents with  . 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    Background history [facility] Cox performed by Dr. Ardath Sax the on 01/28/2013 for muscle invasive bladder cancer status post neoadjuvant it chemotherapy who presents today for follow up visit.  His pathological diagnosis was pT2b TCC positive bladder cancer and pT2c NO Adenocarcinoma of the prostate who is currently being followed and managed by Dr Lovette Cliche at [facility] in [place].  He has had one urinary tract infection positive for enterococcus and Escherichia coli  on 10/01/2014. He does have stoma pain associated with a hernia for which he uses a compression belt and takes gabapentin and tramadol.  The belt can become uncomfortable when worn for long periods of time.  He also have significant discomfort when he is standing for long periods of time.  He has not had recent fevers, chills, nausea or vomiting.  He is not having difficulty with the stroma or foul drainage.  He has recently had a series of urethral washing which have returned atypical urothelial cells, suspicious cells and more recently atypical urothelial cells on 01/18/2016.  He has been on a protocol of imaging every three months (patient driven) with Dr. Darcel Bayley.  There has not been evidence of reoccurrence or metastasis.    Patient underwent surgery with Dr. Darcel Bayley on 11/05/2016.  Pathology was benign.  He is doing well post operatively.  He is having some mild swelling in  the penis.  He has not had fevers, chills, nausea or vomiting.   Over the last few days, he has noticed a greenish color to his incision.  He is worried about infection.  He is controlling his post operative pain with Tramadol and 2000 mg of acetaminophen daily.    He has been in contact with Dr. Darcel Bayley and has sent him images of his wound.  Dr. Darcel Bayley states that he is pleased with how he is recovering.  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  He is worried about his discharge.  He has not had fevers, chills, nausea or vomiting.    Today, he states he is feeling better.  He has more energy.  He feels that he is almost completely healed.  He has not had discharge.  He has not had fevers, chills, nausea or vomiting.  He was seen in November by Dr. Darcel Bayley and imaging studies were performed and no cancer was identified.     PMH: Past Medical History:  Diagnosis Date  . Cancer ([place]) 2015   T2 uroepithelial bladder carcinoma   . Depression   . GERD (gastroesophageal reflux disease)   . Heart murmur   . History of atrial fibrillation without current medication    POST OP MITRIAL VALVE REPLACEMENT IN 2004  . 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 Hyperlipidemia   . Hypertension  Surgical History: Past Surgical History:  Procedure Laterality Date  . CARDIAC CATHETERIZATION    . CATARACT EXTRACTION W/PHACO Left 11/20/2015   Procedure: CATARACT EXTRACTION PHACO AND INTRAOCULAR LENS PLACEMENT (IOC);  Surgeon: Birder Robson, MD;  Location: [facility] ORS;  Service: Ophthalmology;  Laterality: Left;  [place] AP% CDE fluid pack lot # [id number]  . CHOLECYSTECTOMY  2011  . COLONOSCOPY  2009, 2013   Dr Vassie Moment ADENOMA   . COLONOSCOPY WITH PROPOFOL N/A 07/24/2016   Procedure: COLONOSCOPY WITH PROPOFOL;  Surgeon: Robert Bellow, MD;  Location: [facility] ENDOSCOPY;   Service: Endoscopy;  Laterality: N/A;  . cystectomy  2014   ileal conduit Cox   . HERNIA REPAIR  1308   umbilical   . TONSILLECTOMY  AS CHILD  . TRANSTHORACIC ECHOCARDIOGRAM  11-03-2008  DR FATH [name])   LVF NORMAL/ EF 55-60%/ LEFT ATRIAL ENLARGEMENT/  ADEQUATELY FUNCTIONING MITRAL VALVE REPAIR/  MILD MR & TRI/  NO SIGNIFICANT CHANGE FROM PREVIOUS ECHO  . TRANSURETHRAL RESECTION OF BLADDER TUMOR N/A 07/27/2012   Procedure: TRANSURETHRAL RESECTION OF BLADDER TUMOR (TURBT);  Surgeon: Franchot Gallo, MD;  [facility];  Service: Urology;  Laterality: N/A;  WITH MITOMYCIN INSTILLATION   . urethroptomy  11/05/2016   [place]  . valvuloplasty with replacement of cosgrove ring,1 congenital ruptur of cordi  2004   [facility]   mitral valve     Home Medications:  Allergies as of 05/12/2017      Reactions   Codeine Nausea And Vomiting, Hives      Medication List        Accurate as of 05/12/17 11:59 PM. Always use your most recent med list.          carvedilol 10 MG 24 hr capsule Commonly known as:  COREG CR Take 10 mg by mouth every evening.   cephALEXin 500 MG capsule Commonly known as:  KEFLEX Take 1 capsule (500 mg total) by mouth 2 (two) times daily.   Clindamycin Phosphate foam APPLY 1 GRAM ON SKIN DAILY   clobetasol ointment 0.05 % Commonly known as:  TEMOVATE Apply topically.   desonide 0.05 % lotion Commonly known as:  DESOWEN   dexlansoprazole 60 MG capsule Commonly known as:  DEXILANT Take 1 capsule (60 mg total) by mouth daily.   dicyclomine 10 MG capsule Commonly known as:  BENTYL Take 1 capsule (10 mg total) by mouth 2 (two) times daily.   Flax Seed Oil 1000 MG Caps Take by mouth.   fluconazole 100 MG tablet Commonly known as:  DIFLUCAN Take 1 tablet (100 mg total) by mouth daily. X 7 days   gabapentin 300 MG capsule Commonly known as:  NEURONTIN Take 1 capsule (300 mg total) by mouth 3 (three) times  daily.   hydrocortisone-pramoxine 2.5-1 % rectal cream Commonly known as:  ANALPRAM-HC APPLY A SMALL AMOUNT TWICE  A DAY AS NEEDED   meloxicam 15 MG tablet Commonly known as:  MOBIC Take 1 tablet (15 mg total) by mouth daily.   mirtazapine 7.5 MG tablet Commonly known as:  REMERON Take 1 tablet (7.5 mg total) by mouth at bedtime.   mirtazapine 15 MG disintegrating tablet Commonly known as:  REMERON SOL-TAB TAKE ONE TABLET BY MOUTH AT BEDTIME   Naftifine HCl 2 % Crea Commonly known as:  NAFTIN Apply 1 drop topically 1 day or 1 dose.   nystatin powder Commonly known as:  MYCOSTATIN/NYSTOP Apply topically 4 (four) times daily.   polyethylene glycol  packet Commonly known as:  MIRALAX / GLYCOLAX Take 17 g by mouth as needed.   PROBIOTIC & ACIDOPHILUS EX ST PO Take 2 tablets by mouth at bedtime as needed.   traMADol 50 MG tablet Commonly known as:  ULTRAM Take 1 tablet (50 mg total) by mouth every 6 (six) hours as needed.       Allergies:  Allergies  Allergen Reactions  . Codeine Nausea And Vomiting and Hives    Family History: Family History  Adopted: Yes  Problem Relation Age of Onset  . Colon cancer Mother   . Cancer Neg Hx        Prostate,Kidney,Bladder  . Prostate cancer Neg Hx   . Bladder Cancer Neg Hx   . Kidney cancer Neg Hx     Social History:  reports that he quit smoking about 5 years ago. he has never used smokeless tobacco. He reports that he drinks about 0.6 oz of alcohol per week. He reports that he does not use drugs.  ROS: UROLOGY Frequent Urination?: No Hard to postpone urination?: No Burning/pain with urination?: No Get up at night to urinate?: No Leakage of urine?: No Urine stream starts and stops?: No Trouble starting stream?: No Do you have to strain to urinate?: No Blood in urine?: No Urinary tract infection?: No Sexually transmitted disease?: No Injury to kidneys or bladder?: No Painful intercourse?: No Weak stream?:  No Erection problems?: No Penile pain?: No Gastrointestinal Nausea?: No Vomiting?: No Indigestion/heartburn?: No Diarrhea?: No Constipation?: No Constitutional Fever: No Night sweats?: No Weight loss?: No Fatigue?: No Skin Skin rash/lesions?: No Itching?: No Eyes Blurred vision?: No Double vision?: No Ears/Nose/Throat Sore throat?: No Sinus problems?: No Hematologic/Lymphatic Swollen glands?: No Easy bruising?: No Cardiovascular Leg swelling?: No Chest pain?: No Respiratory Cough?: No Shortness of breath?: No Endocrine Excessive thirst?: No Musculoskeletal Back pain?: No Joint pain?: No Neurological Headaches?: No Dizziness?: No Psychologic Depression?: No Anxiety?: No   Physical Exam: BP (!) 99/55   Pulse 79   Ht 5\' 4"  (1.626 m)   Wt 149 lb (67.6 kg)   BMI 25.58 kg/m   Constitutional: Well nourished. Alert and oriented, No acute distress. HEENT: [place] AT, moist mucus membranes. Trachea midline, no masses. Cardiovascular: No clubbing, cyanosis, or edema. Respiratory: Normal respiratory effort, no increased work of breathing. GI: Abdomen is soft, non tender, non distended, no abdominal masses. Liver and spleen not palpable.  Abdominal hernia appreciated.  Stool sample for occult testing is not indicated.  Stoma located in the right lower quadrant.  No rash.  It is pink and healthy.  Urine in drainage bag is clear yellow.   GU: No CVA tenderness.  No bladder fullness or masses.  Penis is uncircumcised.  No swelling in the coronal area on the ventral surface.   Skin: No rashes, bruises or suspicious lesions. Lymph: No cervical or inguinal adenopathy. Neurologic: Grossly intact, no focal deficits, moving all 4 extremities. Psychiatric: Normal mood and affect.  Laboratory Data: Results for orders placed or performed in visit on 12/02/16  Comprehensive metabolic panel  Result Value Ref Range   Glucose 89 65 - 99 mg/dL   BUN 12 8 - 27 mg/dL   Creatinine,  Ser 0.98 0.76 - 1.27 mg/dL   GFR calc non Af Amer 80 >59 mL/min/1.73   GFR calc Af Amer 92 >59 mL/min/1.73   BUN/Creatinine Ratio 12 10 - 24   Sodium 140 134 - 144 mmol/L   Potassium 4.3 3.5 - 5.2  mmol/L   Chloride 101 96 - 106 mmol/L   CO2 24 20 - 29 mmol/L   Calcium 9.2 8.6 - 10.2 mg/dL   Total Protein 6.9 6.0 - 8.5 g/dL   Albumin 4.7 3.6 - 4.8 g/dL   Globulin, Total 2.2 1.5 - 4.5 g/dL   Albumin/Globulin Ratio 2.1 1.2 - 2.2   Bilirubin Total 0.3 0.0 - 1.2 mg/dL   Alkaline Phosphatase 76 39 - 117 IU/L   AST 18 0 - 40 IU/L   ALT 16 0 - 44 IU/L  CBC with Differential/Platelet  Result Value Ref Range   WBC 4.7 3.4 - 10.8 x10E3/uL   RBC 4.43 4.14 - 5.80 x10E6/uL   Hemoglobin 13.6 13.0 - 17.7 g/dL   Hematocrit 39.8 37.5 - 51.0 %   MCV 90 79 - 97 fL   MCH 30.7 26.6 - 33.0 pg   MCHC 34.2 31.5 - 35.7 g/dL   RDW 13.2 12.3 - 15.4 %   Platelets 205 150 - 379 x10E3/uL   Neutrophils 52 Not Estab. %   Lymphs 31 Not Estab. %   Monocytes 11 Not Estab. %   Eos 5 Not Estab. %   Basos 1 Not Estab. %   Neutrophils Absolute 2.4 1.4 - 7.0 x10E3/uL   Lymphocytes Absolute 1.5 0.7 - 3.1 x10E3/uL   Monocytes Absolute 0.5 0.1 - 0.9 x10E3/uL   EOS (ABSOLUTE) 0.3 0.0 - 0.4 x10E3/uL   Basophils Absolute 0.0 0.0 - 0.2 x10E3/uL   Immature Granulocytes 0 Not Estab. %   Immature Grans (Abs) 0.0 0.0 - 0.1 x10E3/uL   I have reviewed the labs  Pertinent Imaging:  Assessment & Plan:   1. Bladder cancer:  Patient is status post robotic assisted cystectomy/prostatectomy with ileo conduit Cox performed by Dr. Ardath Sax the on 01/28/2013.  And now s/p urethrectomy by Dr. Darcel Bayley.  He is being followed at [facility] in [place].   He is concerned that an infection may re occur so we will refill his Keflex.  Refills given for tramadol, nystatin cream and diflucan as I will be out of the country for the next two weeks  2. Prostate cancer:   Patient is status post robotic assisted  cystectomy/prostatectomy with ileo conduit Cox performed by Dr. Ardath Sax the on 01/28/2013.  He is being followed at [facility] in [place].     Return if symptoms worsen or fail to improve.  Zara Council, [facility] [address] [place], [place] [address] [phone]

## 2017-05-12 ENCOUNTER — Encounter: Payer: Self-pay | Admitting: Urology

## 2017-05-12 ENCOUNTER — Telehealth: Payer: Self-pay | Admitting: Radiology

## 2017-05-12 ENCOUNTER — Ambulatory Visit (INDEPENDENT_AMBULATORY_CARE_PROVIDER_SITE_OTHER): Payer: Medicare Other | Admitting: Urology

## 2017-05-12 VITALS — BP 99/55 | HR 79 | Ht 64.0 in | Wt 149.0 lb

## 2017-05-12 DIAGNOSIS — C61 Malignant neoplasm of prostate: Secondary | ICD-10-CM | POA: Diagnosis not present

## 2017-05-12 DIAGNOSIS — C679 Malignant neoplasm of bladder, unspecified: Secondary | ICD-10-CM | POA: Diagnosis not present

## 2017-05-12 MED ORDER — NYSTATIN 100000 UNIT/GM EX POWD: Freq: Four times a day (QID) | CUTANEOUS | 4 refills | Status: DC

## 2017-05-12 MED ORDER — FLUCONAZOLE 100 MG PO TABS: 100.0000 mg | ORAL_TABLET | Freq: Every day | ORAL | 3 refills | Status: DC

## 2017-05-12 MED ORDER — TRAMADOL HCL 50 MG PO TABS: 50.0000 mg | ORAL_TABLET | Freq: Four times a day (QID) | ORAL | 0 refills | Status: DC | PRN

## 2017-05-12 NOTE — Telephone Encounter (Signed)
LMOM. Script for tramadol left at front desk for pt to pick up.

## 2017-06-09 ENCOUNTER — Ambulatory Visit (INDEPENDENT_AMBULATORY_CARE_PROVIDER_SITE_OTHER): Payer: Medicare Other | Admitting: Urology

## 2017-06-09 ENCOUNTER — Encounter: Payer: Self-pay | Admitting: Urology

## 2017-06-09 VITALS — BP 115/69 | HR 69 | Ht 66.0 in | Wt 149.7 lb

## 2017-06-09 DIAGNOSIS — C61 Malignant neoplasm of prostate: Secondary | ICD-10-CM

## 2017-06-09 DIAGNOSIS — F419 Anxiety disorder, unspecified: Secondary | ICD-10-CM

## 2017-06-09 DIAGNOSIS — C679 Malignant neoplasm of bladder, unspecified: Secondary | ICD-10-CM

## 2017-06-12 NOTE — Progress Notes (Signed)
06/09/2017 11:38 AM   Tony Cox June 06, 1950 [id number]  Referring provider: Crecencio Mc, MD [facility] [place], [place] [id number]  Chief Complaint  Patient presents with  . 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    Background history Tony Cox is a 67 year old Caucasian male who is status post robotic assisted cystectomy/prostatectomy with ileo conduit diversion performed by Tony. Ardath Cox the on 01/28/2013 for muscle invasive bladder cancer status post neoadjuvant it chemotherapy who presents today for follow up visit.  His pathological diagnosis was pT2b TCC positive bladder cancer and pT2c NO Adenocarcinoma of the prostate who is currently being followed and managed by Tony [facility] at [facility] in [place].  He has had one urinary tract infection positive for enterococcus and Escherichia coli  on 10/01/2014. He does have stoma pain associated with a hernia for which he uses a compression belt and takes gabapentin and tramadol.  The belt can become uncomfortable when worn for long periods of time.  He also have significant discomfort when he is standing for long periods of time.  He has not had recent fevers, chills, nausea or vomiting.  He is not having difficulty with the stroma or foul drainage.  He has recently had a series of urethral washing which have returned atypical urothelial cells, suspicious cells and more recently atypical urothelial cells on 01/18/2016.  He has been on a protocol of imaging every three months (patient driven) with Tony. Darcel Cox.  There has not been evidence of reoccurrence or metastasis.    Patient underwent urethrectomy with Tony. Darcel Cox on 11/05/2016.  Pathology was benign.    He is being seen q 3 months  at [facility] with Tony. Darcel Cox.    He is needing paperwork for his flights to [place] for his comfort animal.    PMH: Past Medical History:  Diagnosis Date  . Cancer ([place]) 2015   T2 uroepithelial bladder carcinoma   . Depression   . GERD (gastroesophageal reflux disease)   . Heart murmur   . History of atrial fibrillation without current medication    POST OP MITRIAL VALVE REPLACEMENT IN 2004  . 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 Hyperlipidemia   . Hypertension     Surgical History: Past Surgical History:  Procedure Laterality Date  . CARDIAC CATHETERIZATION    . CATARACT EXTRACTION W/PHACO Left 11/20/2015   Procedure: CATARACT EXTRACTION PHACO AND INTRAOCULAR LENS PLACEMENT (IOC);  Surgeon: Tony Robson, MD;  Location: [facility] ORS;  Service: Ophthalmology;  Laterality: Left;  [place] AP% CDE fluid pack lot # [id number]  . CHOLECYSTECTOMY  2011  . COLONOSCOPY  2009, 2013   Tony Tony Cox ADENOMA   . COLONOSCOPY WITH PROPOFOL N/A 07/24/2016   Procedure: COLONOSCOPY WITH PROPOFOL;  Surgeon: Tony Bellow, MD;  Location: [facility] ENDOSCOPY;  Service: Endoscopy;  Laterality: N/A;  . cystectomy  2014   ileal conduit diversion   . HERNIA REPAIR  5784   umbilical   . TONSILLECTOMY  AS CHILD  . TRANSTHORACIC ECHOCARDIOGRAM  11-03-2008  Tony Tony Cox Tony Cox)   LVF NORMAL/ EF 55-60%/  LEFT ATRIAL ENLARGEMENT/  ADEQUATELY FUNCTIONING MITRAL VALVE REPAIR/  MILD MR & TRI/  NO SIGNIFICANT CHANGE FROM PREVIOUS ECHO  . TRANSURETHRAL RESECTION OF BLADDER TUMOR N/A 07/27/2012   Procedure: TRANSURETHRAL RESECTION OF BLADDER TUMOR (TURBT);  Surgeon: Tony Gallo, MD;  Location: [facility];  Service: Urology;  Laterality: N/A;  WITH MITOMYCIN INSTILLATION   . urethroptomy  11/05/2016   [place]  . valvuloplasty with replacement of cosgrove ring,1 congenital ruptur  of cordi  2004   Tony Cox   mitral valve     Home Medications:  Allergies as of 06/09/2017      Reactions   Codeine Nausea And Vomiting, Hives      Medication List        Accurate as of 06/09/17 11:59 PM. Always use your most recent med list.          carvedilol 10 MG 24 hr capsule Commonly known as:  COREG CR Take 10 mg by mouth every evening.   cephALEXin 500 MG capsule Commonly known as:  KEFLEX Take 1 capsule (500 mg total) by mouth 2 (two) times daily.   Clindamycin Phosphate foam APPLY 1 GRAM ON SKIN DAILY   clobetasol ointment 0.05 % Commonly known as:  TEMOVATE Apply topically.   desonide 0.05 % lotion Commonly known as:  DESOWEN   dexlansoprazole 60 MG capsule Commonly known as:  DEXILANT Take 1 capsule (60 mg total) by mouth daily.   dicyclomine 10 MG capsule Commonly known as:  BENTYL Take 1 capsule (10 mg total) by mouth 2 (two) times daily.   Flax Seed Oil 1000 MG Caps Take by mouth.   fluconazole 100 MG tablet Commonly known as:  DIFLUCAN Take 1 tablet (100 mg total) by mouth daily. X 7 days   gabapentin 300 MG capsule Commonly known as:  NEURONTIN Take 1 capsule (300 mg total) by mouth 3 (three) times daily.   hydrocortisone-pramoxine 2.5-1 % rectal cream Commonly known as:  ANALPRAM-HC APPLY A SMALL AMOUNT TWICE  A DAY AS NEEDED   meloxicam 15 MG tablet Commonly known as:  MOBIC Take 1 tablet (15 mg total) by mouth daily.   mirtazapine 7.5 MG tablet Commonly known as:  REMERON Take 1 tablet (7.5 mg total) by mouth at bedtime.   mirtazapine 15 MG disintegrating tablet Commonly known as:  REMERON SOL-TAB TAKE ONE TABLET BY MOUTH AT BEDTIME   Naftifine HCl 2 % Crea Commonly known as:  NAFTIN Apply 1 drop topically 1 day or 1 dose.   nystatin powder Commonly known as:  MYCOSTATIN/NYSTOP Apply topically 4 (four) times daily.   polyethylene glycol packet Commonly known as:  MIRALAX / GLYCOLAX Take 17 g by mouth as  needed.   PROBIOTIC & ACIDOPHILUS EX ST PO Take 2 tablets by mouth at bedtime as needed.   traMADol 50 MG tablet Commonly known as:  ULTRAM Take 1 tablet (50 mg total) by mouth every 6 (six) hours as needed.       Allergies:  Allergies  Allergen Reactions  . Codeine Nausea And Vomiting and Hives    Family History: Family History  Adopted: Yes  Problem Relation Age of Onset  . Colon cancer Mother   . Cancer Neg Hx        Prostate,Kidney,Bladder  . Prostate cancer Neg Hx   . Bladder Cancer Neg Hx   . Kidney cancer Neg Hx     Social History:  reports that he quit smoking about 5  years ago. he has never used smokeless tobacco. He reports that he drinks about 0.6 oz of alcohol per week. He reports that he does not use drugs.  ROS: UROLOGY Frequent Urination?: No Hard to postpone urination?: No Burning/pain with urination?: No Get up at night to urinate?: No Leakage of urine?: No Urine stream starts and stops?: No Trouble starting stream?: No Do you have to strain to urinate?: No Blood in urine?: No Urinary tract infection?: No Sexually transmitted disease?: No Injury to kidneys or bladder?: No Painful intercourse?: No Weak stream?: No Erection problems?: No Penile pain?: No Gastrointestinal Nausea?: No Vomiting?: No Indigestion/heartburn?: No Diarrhea?: No Constipation?: No Constitutional Fever: No Night sweats?: No Weight loss?: No Fatigue?: No Skin Skin rash/lesions?: No Itching?: No Eyes Blurred vision?: No Double vision?: No Ears/Nose/Throat Sore throat?: No Sinus problems?: No Hematologic/Lymphatic Swollen glands?: No Easy bruising?: No Cardiovascular Leg swelling?: No Chest pain?: No Respiratory Cough?: No Shortness of breath?: No Endocrine Excessive thirst?: No Musculoskeletal Back pain?: No Joint pain?: No Neurological Headaches?: No Dizziness?: No Psychologic Depression?: No Anxiety?: No   Physical Exam: BP 115/69 (BP  Location: Right Arm, Patient Position: Sitting, Cuff Size: Normal)   Pulse 69   Ht 5\' 6"  (1.676 m)   Wt 149 lb 11.2 oz (67.9 kg)   SpO2 99%   BMI 24.16 kg/m   Constitutional: Well nourished. Alert and oriented, No acute distress. HEENT: [place] AT, moist mucus membranes. Trachea midline, no masses. Cardiovascular: No clubbing, cyanosis, or edema. Respiratory: Normal respiratory effort, no increased work of breathing. Skin: No rashes, bruises or suspicious lesions. Lymph: No cervical or inguinal adenopathy. Neurologic: Grossly intact, no focal deficits, moving all 4 extremities. Psychiatric: Normal mood and affect.  Laboratory Data: Results for orders placed or performed in visit on 12/02/16  Comprehensive metabolic panel  Result Value Ref Range   Glucose 89 65 - 99 mg/dL   BUN 12 8 - 27 mg/dL   Creatinine, Ser 0.98 0.76 - 1.27 mg/dL   GFR calc non Af Amer 80 >59 mL/min/1.73   GFR calc Af Amer 92 >59 mL/min/1.73   BUN/Creatinine Ratio 12 10 - 24   Sodium 140 134 - 144 mmol/L   Potassium 4.3 3.5 - 5.2 mmol/L   Chloride 101 96 - 106 mmol/L   CO2 24 20 - 29 mmol/L   Calcium 9.2 8.6 - 10.2 mg/dL   Total Protein 6.9 6.0 - 8.5 g/dL   Albumin 4.7 3.6 - 4.8 g/dL   Globulin, Total 2.2 1.5 - 4.5 g/dL   Albumin/Globulin Ratio 2.1 1.2 - 2.2   Bilirubin Total 0.3 0.0 - 1.2 mg/dL   Alkaline Phosphatase 76 39 - 117 IU/L   AST 18 0 - 40 IU/L   ALT 16 0 - 44 IU/L  CBC with Differential/Platelet  Result Value Ref Range   WBC 4.7 3.4 - 10.8 x10E3/uL   RBC 4.43 4.14 - 5.80 x10E6/uL   Hemoglobin 13.6 13.0 - 17.7 g/dL   Hematocrit 39.8 37.5 - 51.0 %   MCV 90 79 - 97 fL   MCH 30.7 26.6 - 33.0 pg   MCHC 34.2 31.5 - 35.7 g/dL   RDW 13.2 12.3 - 15.4 %   Platelets 205 150 - 379 x10E3/uL   Neutrophils 52 Not Estab. %   Lymphs 31 Not Estab. %   Monocytes 11 Not Estab. %   Eos 5 Not Estab. %   Basos 1 Not Estab. %   Neutrophils Absolute 2.4 1.4 -  7.0 x10E3/uL   Lymphocytes Absolute 1.5 0.7 -  3.1 x10E3/uL   Monocytes Absolute 0.5 0.1 - 0.9 x10E3/uL   EOS (ABSOLUTE) 0.3 0.0 - 0.4 x10E3/uL   Basophils Absolute 0.0 0.0 - 0.2 x10E3/uL   Immature Granulocytes 0 Not Estab. %   Immature Grans (Abs) 0.0 0.0 - 0.1 x10E3/uL   I have reviewed the labs  Pertinent Imaging:  Assessment & Plan:   1. Bladder cancer:  Patient is status post robotic assisted cystectomy/prostatectomy with ileo conduit diversion performed by Tony. Ardath Cox the on 01/28/2013.  And now s/p urethrectomy by Tony. Darcel Cox.  He is being followed at [facility] in [place].     2. Prostate cancer:   Patient is status post robotic assisted cystectomy/prostatectomy with ileo conduit diversion performed by Tony. Ardath Cox the on 01/28/2013.  He is being followed at [facility] in [place].    3. Anxiety  - paper work is completed for support animal   Return if symptoms worsen or fail to improve.  Zara Council, PA-C  [facility] [address] [place], [place] [address] [phone]  I spent 15 minutes in a face to face consultation to fill out his forms.  Greater than 50% was spent in counseling & coordination of care with the patient.

## 2017-06-24 ENCOUNTER — Other Ambulatory Visit: Payer: Self-pay | Admitting: Urology

## 2017-06-24 NOTE — Progress Notes (Signed)
Scripts for Building services engineer sent to [employer] in [place], [place].

## 2017-07-17 ENCOUNTER — Ambulatory Visit: Payer: Medicare Other | Admitting: Internal Medicine

## 2017-07-17 ENCOUNTER — Encounter: Payer: Self-pay | Admitting: Internal Medicine

## 2017-07-17 DIAGNOSIS — T733XXA Exhaustion due to excessive exertion, initial encounter: Secondary | ICD-10-CM

## 2017-07-17 DIAGNOSIS — C679 Malignant neoplasm of bladder, unspecified: Secondary | ICD-10-CM

## 2017-07-17 NOTE — Progress Notes (Signed)
Subjective:  Patient ID: Tony Cox, male    DOB: Nov 04, 1950  Age: 67 y.o. MRN: [id number]  CC: Diagnoses of Malignant neoplasm of urinary bladder, unspecified site [facility]) and Fatigue due to excessive exertion, initial encounter were pertinent to this visit.  HPI Tony Cox presents for follow up on chronic issues.  He is not feeling well.  He is physically and emotionally exhausted His current mental, physical and emotional status.  Patient is dealing with recurrent bladder cancer andl also caring for  His aging mother who has been diagnosed with lymphocytic leukemia.  Patient is still working full time but is having a much more difficult time and feeling run down,    He has increased anxiety,  Insomnia .    History of muscle invasive bladder cancer s/p robotic cystatectomy /prostatectomy with ileoconduit diversion Oct 2014    Patient is  s/p urethrectomy July 2018 for possible recurrence of bladder cancer in the urethra .  Benign path.   3 month follow up with Lovette Cliche at [facility] Feb 2019   Mother adeele has been diagnosed with large granula lymphocytic leukemia by flow ctyology and was givne treatment choices including cyclophsphamide and MTX.       Outpatient Medications Prior to Visit  Medication Sig Dispense Refill  . carvedilol (COREG CR) 10 MG 24 hr capsule Take 10 mg by mouth every evening.    . Clindamycin Phosphate foam APPLY 1 GRAM ON SKIN DAILY  5  . clobetasol ointment (TEMOVATE) 0.05 % Apply topically.    Marland Kitchen desonide (DESOWEN) 0.05 % lotion     . dexlansoprazole (DEXILANT) 60 MG capsule Take 1 capsule (60 mg total) by mouth daily. 30 capsule 12  . dicyclomine (BENTYL) 10 MG capsule Take 1 capsule (10 mg total) by mouth 2 (two) times daily. 180 capsule 3  . Flaxseed, Linseed, (FLAX SEED OIL) 1000 MG CAPS Take by mouth.    . gabapentin (NEURONTIN) 300 MG capsule Take 1 capsule (300 mg total) by mouth 3 (three) times  daily. 270 capsule 3  . hydrocortisone-pramoxine (ANALPRAM-HC) 2.5-1 % rectal cream APPLY A SMALL AMOUNT TWICE  A DAY AS NEEDED 30 g 0  . meloxicam (MOBIC) 15 MG tablet Take 1 tablet (15 mg total) by mouth daily. 30 tablet 3  . mirtazapine (REMERON SOL-TAB) 15 MG disintegrating tablet TAKE ONE TABLET BY MOUTH AT BEDTIME 90 tablet 1  . mirtazapine (REMERON) 7.5 MG tablet Take 1 tablet (7.5 mg total) by mouth at bedtime. 90 tablet 3  . Naftifine HCl (NAFTIN) 2 % CREA Apply 1 drop topically 1 day or 1 dose. 60 g 2  . nystatin (MYCOSTATIN/NYSTOP) powder Apply topically 4 (four) times daily. 15 g 4  . polyethylene glycol (MIRALAX / GLYCOLAX) packet Take 17 g by mouth as needed.     . Probiotic Product (PROBIOTIC & ACIDOPHILUS EX ST PO) Take 2 tablets by mouth at bedtime as needed.    . traMADol (ULTRAM) 50 MG tablet Take 1 tablet (50 mg total) by mouth every 6 (six) hours as needed. 30 tablet 0  . mupirocin ointment (BACTROBAN) 2 % APP EXT AA  BID TO STOMA UTD  3  . cephALEXin (KEFLEX) 500 MG capsule Take 1 capsule (500 mg total) by mouth 2 (two) times daily. (Patient not taking: Reported on 07/17/2017) 14 capsule 0  . fluconazole (DIFLUCAN) 100 MG tablet Take 1 tablet (100 mg total) by mouth daily.  X 7 days (Patient not taking: Reported on 07/17/2017) 7 tablet 3   No facility-administered medications prior to visit.     Review of Systems;  Patient denies headache, fevers, malaise, unintentional weight loss, skin rash, eye pain, sinus congestion and sinus pain, sore throat, dysphagia,  hemoptysis , cough, dyspnea, wheezing, chest pain, palpitations, orthopnea, edema, abdominal pain, nausea, melena, diarrhea, constipation, flank pain, dysuria, hematuria, urinary  Frequency, nocturia, numbness, tingling, seizures,  Focal weakness, Loss of consciousness,  Tremor, insomnia, depression, anxiety, and suicidal ideation.      Objective:  BP 124/84 (BP Location: Left Arm, Patient Position: Sitting, Cuff  Size: Normal)   Pulse 66   Temp 98.2 F (36.8 C) (Oral)   Resp 14   Ht 5\' 6"  (1.676 m)   Wt 150 lb 9.6 oz (68.3 kg)   SpO2 96%   BMI 24.31 kg/m   BP Readings from Last 3 Encounters:  07/17/17 124/84  06/09/17 115/69  05/12/17 (!) 99/55    Wt Readings from Last 3 Encounters:  07/17/17 150 lb 9.6 oz (68.3 kg)  06/09/17 149 lb 11.2 oz (67.9 kg)  05/12/17 149 lb (67.6 kg)    General appearance: alert, cooperative and appears stated age Ears: normal TM's and external ear canals both ears Throat: lips, mucosa, and tongue normal; teeth and gums normal Neck: no adenopathy, no carotid bruit, supple, symmetrical, trachea midline and thyroid not enlarged, symmetric, no tenderness/mass/nodules Back: symmetric, no curvature. ROM normal. No CVA tenderness. Lungs: clear to auscultation bilaterally Heart: regular rate and rhythm, S1, S2 normal, no murmur, click, rub or gallop Abdomen: soft, non-tender; bowel sounds normal; no masses,  no organomegaly Pulses: 2+ and symmetric Skin: Skin color, texture, turgor normal. No rashes or lesions Lymph nodes: Cervical, supraclavicular, and axillary nodes normal.  No results found for: HGBA1C  Lab Results  Component Value Date   CREATININE 0.98 12/02/2016   CREATININE 0.95 10/13/2015   CREATININE 0.91 11/09/2012    Lab Results  Component Value Date   WBC 4.7 12/02/2016   HGB 13.6 12/02/2016   HCT 39.8 12/02/2016   PLT 205 12/02/2016   GLUCOSE 89 12/02/2016   CHOL 195 10/13/2015   TRIG 112 10/13/2015   HDL 63 10/13/2015   LDLDIRECT 135.5 01/16/2010   LDLCALC 110 10/13/2015   ALT 16 12/02/2016   AST 18 12/02/2016   NA 140 12/02/2016   K 4.3 12/02/2016   CL 101 12/02/2016   CREATININE 0.98 12/02/2016   BUN 12 12/02/2016   CO2 24 12/02/2016   TSH 1.20 10/13/2015   PSA 2.1 10/19/2012   MICROALBUR 0.7 07/07/2012    No results found.  Assessment & Plan:   Problem List Items Addressed This Visit    Malignant neoplasm of bladder  (HCC)    T2 urothelial carcinoma of bladder, s/p neoadjuvant chemo, radical cystectomy with ilealconduit diversion.  No recurrence at 3 years.       Fatigue    Multifactorial.  Will screen for thyroid, anemia  b12         A total of 25 minutes of face to face time was spent with patient more than half of which was spent in counselling about the above mentioned conditions  and coordination of care  I have discontinued Glade Nurse. Chestang's cephALEXin and fluconazole. I am also having him maintain his carvedilol, desonide, hydrocortisone-pramoxine, Clindamycin Phosphate, polyethylene glycol, Probiotic Product (PROBIOTIC & ACIDOPHILUS EX ST PO), dexlansoprazole, dicyclomine, gabapentin, mirtazapine, clobetasol ointment, Flax Seed Oil,  Naftifine HCl, mirtazapine, meloxicam, traMADol, nystatin, and mupirocin ointment.  No orders of the defined types were placed in this encounter.   Medications Discontinued During This Encounter  Medication Reason  . cephALEXin (KEFLEX) 500 MG capsule Completed Course  . fluconazole (DIFLUCAN) 100 MG tablet Completed Course    Follow-up: No follow-ups on file.   Crecencio Mc, MD

## 2017-07-19 DIAGNOSIS — R5383 Other fatigue: Secondary | ICD-10-CM | POA: Insufficient documentation

## 2017-07-19 NOTE — Assessment & Plan Note (Signed)
T2 urothelial carcinoma of bladder, s/p neoadjuvant chemo, radical cystectomy with ilealconduit diversion.  No recurrence at 3 years.

## 2017-07-19 NOTE — Assessment & Plan Note (Signed)
Multifactorial.  Will screen for thyroid, anemia  b12

## 2017-07-31 ENCOUNTER — Telehealth: Payer: Self-pay | Admitting: Podiatry

## 2017-07-31 NOTE — Telephone Encounter (Signed)
Pt is requesting his x-rays be burned to a CD for him to pick up Monday afternoon to take to another appointment he has on Tuesday. I made the pt aware he would need to fill out and sign a medical records release form when he comes in to pick up the CD of x-rays. Also, I let him know there will be a $5.00 charge.

## 2017-08-13 ENCOUNTER — Ambulatory Visit: Payer: Medicare Other | Admitting: Internal Medicine

## 2017-08-13 ENCOUNTER — Encounter: Payer: Self-pay | Admitting: Internal Medicine

## 2017-08-13 DIAGNOSIS — M21969 Unspecified acquired deformity of unspecified lower leg: Secondary | ICD-10-CM

## 2017-08-13 NOTE — Progress Notes (Signed)
Subjective:  Patient ID: Tony Cox, male    DOB: 05-17-1950  Age: 67 y.o. MRN: [id number]  CC: The encounter diagnosis was Acquired calf asymmetry, unspecified laterality.  HPIcLF  Tony Cox presents for concern about new onset right lower extremity edema/lymphedema  He has a history of right foot injury, remote with hairline fracture.  History of LN's removed on right side  denies orthopnea, Calf pain etc.  Has been elevating legs at night,  takes mobic and gabapentin    Exam:    Outpatient Medications Prior to Visit  Medication Sig Dispense Refill  . carvedilol (COREG CR) 10 MG 24 hr capsule Take 10 mg by mouth every evening.    . Clindamycin Phosphate foam APPLY 1 GRAM ON SKIN DAILY  5  . desonide (DESOWEN) 0.05 % lotion     . dexlansoprazole (DEXILANT) 60 MG capsule Take 1 capsule (60 mg total) by mouth daily. 30 capsule 12  . dicyclomine (BENTYL) 10 MG capsule Take 1 capsule (10 mg total) by mouth 2 (two) times daily. 180 capsule 3  . gabapentin (NEURONTIN) 300 MG capsule Take 1 capsule (300 mg total) by mouth 3 (three) times daily. 270 capsule 3  . hydrocortisone-pramoxine (ANALPRAM-HC) 2.5-1 % rectal cream APPLY A SMALL AMOUNT TWICE  A DAY AS NEEDED 30 g 0  . meloxicam (MOBIC) 15 MG tablet Take 1 tablet (15 mg total) by mouth daily. 30 tablet 3  . metroNIDAZOLE (METROGEL) 1 % gel     . mirtazapine (REMERON SOL-TAB) 15 MG disintegrating tablet TAKE ONE TABLET BY MOUTH AT BEDTIME (Patient taking differently: TAKE ONE HALF TABLET BY MOUTH AT BEDTIME) 90 tablet 1  . nystatin (MYCOSTATIN/NYSTOP) powder Apply topically 4 (four) times daily. 15 g 4  . polyethylene glycol (MIRALAX / GLYCOLAX) packet Take 17 g by mouth as needed.     . Probiotic Product (PROBIOTIC & ACIDOPHILUS EX ST PO) Take 2 tablets by mouth at bedtime as needed.    . traMADol (ULTRAM) 50 MG tablet Take 1 tablet (50 mg total) by mouth every 6 (six) hours as needed. 30 tablet 0  . triamcinolone cream  (KENALOG) 0.1 %     . mirtazapine (REMERON) 7.5 MG tablet Take 1 tablet (7.5 mg total) by mouth at bedtime. (Patient not taking: Reported on 08/13/2017) 90 tablet 3  . Flaxseed, Linseed, (FLAX SEED OIL) 1000 MG CAPS Take by mouth.    . mupirocin ointment (BACTROBAN) 2 % APP EXT AA  BID TO STOMA UTD  3  . Naftifine HCl (NAFTIN) 2 % CREA Apply 1 drop topically 1 day or 1 dose. (Patient not taking: Reported on 08/13/2017) 60 g 2   No facility-administered medications prior to visit.     Review of Systems;  Patient denies headache, fevers, malaise, unintentional weight loss, skin rash, eye pain, sinus congestion and sinus pain, sore throat, dysphagia,  hemoptysis , cough, dyspnea, wheezing, chest pain, palpitations, orthopnea, edema, abdominal pain, nausea, melena, diarrhea, constipation, flank pain, dysuria, hematuria, urinary  Frequency, nocturia, numbness, tingling, seizures,  Focal weakness, Loss of consciousness,  Tremor, insomnia, depression, anxiety, and suicidal ideation.      Objective:  BP 102/66 (BP Location: Left Arm, Patient Position: Sitting, Cuff Size: Normal)   Pulse (!) 57   Temp 97.6 F (36.4 C) (Oral)   Resp 15   Ht 5\' 6"  (1.676 m)   Wt 148 lb 1.9 oz (67.2 kg)   SpO2 98%   BMI 23.91 kg/m  BP Readings from Last 3 Encounters:  08/13/17 102/66  07/17/17 124/84  06/09/17 115/69    Wt Readings from Last 3 Encounters:  08/13/17 148 lb 1.9 oz (67.2 kg)  07/17/17 150 lb 9.6 oz (68.3 kg)  06/09/17 149 lb 11.2 oz (67.9 kg)    General appearance: alert, cooperative and appears stated age Neck: no adenopathy, no carotid bruit, supple, symmetrical, trachea midline and thyroid not enlarged, symmetric, no tenderness/mass/nodules Back: symmetric, no curvature. ROM normal. No CVA tenderness. Lungs: clear to auscultation bilaterally Heart: regular rate and rhythm, S1, S2 normal, no murmur, click, rub or gallop Ext: No edema appreciated .  right calf muscle and thigh  appear  more well developed than left,  No calf tenderness.  Circumference OF RIGHT THIGH 17..25 IN   LEFT THIGH 17  IN y Pulses: 2+ and symmetric Skin: Skin color, texture, turgor normal. No rashes or lesions Lymph nodes: Cervical, supraclavicular, and axillary nodes normal.  No results found for: HGBA1C  Lab Results  Component Value Date   CREATININE 0.98 12/02/2016   CREATININE 0.95 10/13/2015   CREATININE 0.91 11/09/2012    Lab Results  Component Value Date   WBC 4.7 12/02/2016   HGB 13.6 12/02/2016   HCT 39.8 12/02/2016   PLT 205 12/02/2016   GLUCOSE 89 12/02/2016   CHOL 195 10/13/2015   TRIG 112 10/13/2015   HDL 63 10/13/2015   LDLDIRECT 135.5 01/16/2010   LDLCALC 110 10/13/2015   ALT 16 12/02/2016   AST 18 12/02/2016   NA 140 12/02/2016   K 4.3 12/02/2016   CL 101 12/02/2016   CREATININE 0.98 12/02/2016   BUN 12 12/02/2016   CO2 24 12/02/2016   TSH 1.20 10/13/2015   PSA 2.1 10/19/2012   MICROALBUR 0.7 07/07/2012    No results found.  Assessment & Plan:   Problem List Items Addressed This Visit    Acquired calf asymmetry    No edema appreciated o either leg  .  right calf muscle and thigh  appear more well developed than left,  No calf Reassurance provided.          I have discontinued Glade Nurse. Pinney's Flax Seed Oil, Naftifine HCl, and mupirocin ointment. I am also having him maintain his carvedilol, desonide, hydrocortisone-pramoxine, Clindamycin Phosphate, polyethylene glycol, Probiotic Product (PROBIOTIC & ACIDOPHILUS EX ST PO), dexlansoprazole, dicyclomine, gabapentin, mirtazapine, mirtazapine, meloxicam, traMADol, nystatin, metroNIDAZOLE, and triamcinolone cream.  No orders of the defined types were placed in this encounter.   Medications Discontinued During This Encounter  Medication Reason  . Flaxseed, Linseed, (FLAX SEED OIL) 1000 MG CAPS Patient has not taken in last 30 days  . mupirocin ointment (BACTROBAN) 2 % Completed Course  . Naftifine HCl  (NAFTIN) 2 % CREA Patient has not taken in last 30 days    Follow-up: No follow-ups on file.   Crecencio Mc, MD

## 2017-08-15 DIAGNOSIS — M21969 Unspecified acquired deformity of unspecified lower leg: Secondary | ICD-10-CM | POA: Insufficient documentation

## 2017-08-15 NOTE — Assessment & Plan Note (Signed)
No edema appreciated o either leg  .  right calf muscle and thigh  appear more well developed than left,  No calf Reassurance provided.

## 2017-08-18 ENCOUNTER — Other Ambulatory Visit: Payer: Self-pay | Admitting: Internal Medicine

## 2017-08-18 ENCOUNTER — Other Ambulatory Visit: Payer: Self-pay | Admitting: Podiatry

## 2017-09-11 ENCOUNTER — Encounter (INDEPENDENT_AMBULATORY_CARE_PROVIDER_SITE_OTHER): Payer: Medicare Other | Admitting: Vascular Surgery

## 2017-09-11 ENCOUNTER — Ambulatory Visit (INDEPENDENT_AMBULATORY_CARE_PROVIDER_SITE_OTHER): Payer: Medicare Other | Admitting: Vascular Surgery

## 2017-09-11 ENCOUNTER — Other Ambulatory Visit (INDEPENDENT_AMBULATORY_CARE_PROVIDER_SITE_OTHER): Payer: Medicare Other

## 2017-09-11 ENCOUNTER — Encounter (INDEPENDENT_AMBULATORY_CARE_PROVIDER_SITE_OTHER): Payer: Self-pay | Admitting: Vascular Surgery

## 2017-09-11 ENCOUNTER — Other Ambulatory Visit (INDEPENDENT_AMBULATORY_CARE_PROVIDER_SITE_OTHER): Payer: Self-pay | Admitting: Vascular Surgery

## 2017-09-11 VITALS — BP 133/77 | HR 68 | Resp 17 | Ht 66.0 in | Wt 150.2 lb

## 2017-09-11 DIAGNOSIS — E782 Mixed hyperlipidemia: Secondary | ICD-10-CM

## 2017-09-11 DIAGNOSIS — C680 Malignant neoplasm of urethra: Secondary | ICD-10-CM | POA: Diagnosis not present

## 2017-09-11 DIAGNOSIS — R600 Localized edema: Secondary | ICD-10-CM | POA: Diagnosis not present

## 2017-09-11 DIAGNOSIS — C679 Malignant neoplasm of bladder, unspecified: Secondary | ICD-10-CM

## 2017-09-11 DIAGNOSIS — I10 Essential (primary) hypertension: Secondary | ICD-10-CM

## 2017-09-11 DIAGNOSIS — I890 Lymphedema, not elsewhere classified: Secondary | ICD-10-CM | POA: Diagnosis not present

## 2017-09-15 ENCOUNTER — Encounter (INDEPENDENT_AMBULATORY_CARE_PROVIDER_SITE_OTHER): Payer: Self-pay | Admitting: Vascular Surgery

## 2017-09-15 DIAGNOSIS — I890 Lymphedema, not elsewhere classified: Secondary | ICD-10-CM | POA: Insufficient documentation

## 2017-09-15 NOTE — Progress Notes (Signed)
MRN : [id number]  Tony Cox is a 67 y.o. (04/02/1951) male who presents with chief complaint of  Chief Complaint  Patient presents with  . New Patient (Initial Visit)    ref Fath right le swelling  .  History of Present Illness: Patient is seen for evaluation of right leg swelling. The patient first noticed the swelling a few months ago but is now concerned because of a significant increase in the overall size of his right leg compared to his left. The swelling is associated with mild discoloration. The patient notes that in the morning his right leg is significantly improved but it steadily worseness throughout the course of the day. Elevation makes the leg better, dependency makes it much worse.   There is no history of ulcerations associated with the swelling.   He has a history of bladder cancer requiring total cystectomy and subsequent urostomy and then more recently a recurrence in his urethra requiring resection of the urethra.  In that surgical treatment he has also had extensive lymph node dissections on the right.  There was also localized chemotherapy as well as parenteral chemotherapy.  There is no history of radiation treatment to the groin or pelvis  The patient denies any recent changes in their medications.  The patient has not been wearing graduated compression.  The patient has no had any past angiography, interventions or vascular surgery.  The patient denies a history of DVT or PE. There is no prior history of phlebitis. There is no history of primary lymphedema.  No history of 3rd world foreign travel or parasitic infections area    Current Meds  Medication Sig  . carvedilol (COREG CR) 10 MG 24 hr capsule Take 10 mg by mouth every evening.  . Clindamycin Phosphate foam APPLY 1 GRAM ON SKIN DAILY  . desonide (DESOWEN) 0.05 % lotion   . dexlansoprazole (DEXILANT) 60 MG capsule Take 1 capsule (60 mg total) by mouth daily.  Marland Kitchen dicyclomine (BENTYL) 10 MG  capsule Take 1 capsule (10 mg total) by mouth 2 (two) times daily.  Marland Kitchen gabapentin (NEURONTIN) 300 MG capsule Take 1 capsule (300 mg total) by mouth 3 (three) times daily.  . hydrocortisone-pramoxine (ANALPRAM-HC) 2.5-1 % rectal cream APPLY A SMALL AMOUNT TWICE  A DAY AS NEEDED  . meloxicam (MOBIC) 15 MG tablet TAKE ONE TABLET BY MOUTH EVERY DAY  . metroNIDAZOLE (METROGEL) 1 % gel   . mirtazapine (REMERON SOL-TAB) 15 MG disintegrating tablet TAKE ONE TABLET BY MOUTH AT BEDTIME  . nystatin (MYCOSTATIN/NYSTOP) powder Apply topically 4 (four) times daily.  . polyethylene glycol (MIRALAX / GLYCOLAX) packet Take 17 g by mouth as needed.   . Probiotic Product (PROBIOTIC & ACIDOPHILUS EX ST PO) Take 2 tablets by mouth at bedtime as needed.  . traMADol (ULTRAM) 50 MG tablet Take 1 tablet (50 mg total) by mouth every 6 (six) hours as needed.  . triamcinolone cream (KENALOG) 0.1 %     Past Medical History:  Diagnosis Date  . Cancer ([place]) 2015   T2 uroepithelial bladder carcinoma   . Depression   . GERD (gastroesophageal reflux disease)   . Heart murmur   . History of atrial fibrillation without current medication    POST OP MITRIAL VALVE REPLACEMENT IN 2004  . 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 Hyperlipidemia   . Hypertension  Past Surgical History:  Procedure Laterality Date  . CARDIAC CATHETERIZATION    . CATARACT EXTRACTION W/PHACO Left 11/20/2015   Procedure: CATARACT EXTRACTION PHACO AND INTRAOCULAR LENS PLACEMENT (IOC);  Surgeon: Birder Robson, MD;  Location: [facility] ORS;  Service: Ophthalmology;  Laterality: Left;  [place] AP% CDE fluid pack lot # [id number]  . CHOLECYSTECTOMY  2011  . COLONOSCOPY  2009, 2013   Dr Vassie Moment ADENOMA   . COLONOSCOPY WITH PROPOFOL N/A 07/24/2016   Procedure: COLONOSCOPY WITH PROPOFOL;  Surgeon: Robert Bellow, MD;  Location: [facility]  ENDOSCOPY;  Service: Endoscopy;  Laterality: N/A;  . cystectomy  2014   ileal conduit diversion   . HERNIA REPAIR  2993   umbilical   . TONSILLECTOMY  AS CHILD  . TRANSTHORACIC ECHOCARDIOGRAM  11-03-2008  DR FATH [name])   LVF NORMAL/ EF 55-60%/ LEFT ATRIAL ENLARGEMENT/  ADEQUATELY FUNCTIONING MITRAL VALVE REPAIR/  MILD MR & TRI/  NO SIGNIFICANT CHANGE FROM PREVIOUS ECHO  . TRANSURETHRAL RESECTION OF BLADDER TUMOR N/A 07/27/2012   Procedure: TRANSURETHRAL RESECTION OF BLADDER TUMOR (TURBT);  Surgeon: Franchot Gallo, MD;  Location: [facility];  Service: Urology;  Laterality: N/A;  WITH MITOMYCIN INSTILLATION   . urethroptomy  11/05/2016   [place]  . valvuloplasty with replacement of cosgrove ring,1 congenital ruptur of cordi  2004   [facility]   mitral valve     Social History Social History   Tobacco Use  . Smoking status: Former Smoker    Last attempt to quit: 04/22/2012    Years since quitting: 5.4  . Smokeless tobacco: Never Used  Substance Use Topics  . Alcohol use: Yes    Alcohol/week: 0.6 oz    Types: 1 Standard drinks or equivalent per week    Comment: occasional  . Drug use: No    Family History Family History  Adopted: Yes  Problem Relation Age of Onset  . Colon cancer Mother   . Cancer Neg Hx        Prostate,Kidney,Bladder  . Prostate cancer Neg Hx   . Bladder Cancer Neg Hx   . Kidney cancer Neg Hx   No family history of bleeding/clotting disorders, porphyria or autoimmune disease   Allergies  Allergen Reactions  . Codeine Nausea And Vomiting and Hives     REVIEW OF SYSTEMS (Negative unless checked)  Constitutional: [] Weight loss  [] Fever  [] Chills Cardiac: [] Chest pain   [] Chest pressure   [] Palpitations   [] Shortness of breath when laying flat   [] Shortness of breath with exertion. Vascular:  [] Pain in legs with walking   [] Pain in legs at rest  [] History of DVT   [] Phlebitis   [x] Swelling in legs   [] Varicose veins    [] Non-healing ulcers Pulmonary:   [] Uses home oxygen   [] Productive cough   [] Hemoptysis   [] Wheeze  [] COPD   [] Asthma Neurologic:  [] Dizziness   [] Seizures   [] History of stroke   [] History of TIA  [] Aphasia   [] Vissual changes   [] Weakness or numbness in arm   [] Weakness or numbness in leg Musculoskeletal:   [] Joint swelling   [] Joint pain   [] Low back pain Hematologic:  [] Easy bruising  [] Easy bleeding   [] Hypercoagulable state   [] Anemic Gastrointestinal:  [] Diarrhea   [] Vomiting  [] Gastroesophageal reflux/heartburn   [] Difficulty swallowing. Genitourinary:  [] Chronic kidney disease   [] Difficult urination  [] Frequent urination   [] Blood in urine Skin:  [] Rashes   [] Ulcers  Psychological:  [] History of anxiety   []   History of major depression.  Physical Examination  Vitals:   09/11/17 1154  BP: 133/77  Pulse: 68  Resp: 17  Weight: 150 lb 3.2 oz (68.1 kg)  Height: 5\' 6"  (1.676 m)   Body mass index is 24.24 kg/m. Gen: WD/WN, NAD Head: [place]/AT, No temporalis wasting.  Ear/Nose/Throat: Hearing grossly intact, nares w/o erythema or drainage, poor dentition Eyes: PER, EOMI, sclera nonicteric.  Neck: Supple, no masses.  No bruit or JVD.  Pulmonary:  Good air movement, clear to auscultation bilaterally, no use of accessory muscles.  Cardiac: RRR, normal S1, S2, no Murmurs. Vascular: scattered varicosities present bilaterally.  Mild venous stasis changes to the legs right > left.  2+ soft pitting edema right leg Vessel Right Left  Radial Palpable Palpable  Gastrointestinal: soft, non-distended. No guarding/no peritoneal signs. Urostomy present Musculoskeletal: M/S 5/5 throughout.  No deformity or atrophy.  Neurologic: CN 2-12 intact. Pain and light touch intact in extremities.  Symmetrical.  Speech is fluent. Motor exam as listed above. Psychiatric: Judgment intact, Mood & affect appropriate for pt's clinical situation. Dermatologic: mild venous rashes no ulcers noted.  No changes  consistent with cellulitis. Lymph : No Cervical lymphadenopathy, no lichenification or skin changes of chronic lymphedema.  CBC Lab Results  Component Value Date   WBC 4.7 12/02/2016   HGB 13.6 12/02/2016   HCT 39.8 12/02/2016   MCV 90 12/02/2016   PLT 205 12/02/2016    BMET    Component Value Date/Time   NA 140 12/02/2016 1332   NA 138 11/09/2012 0820   K 4.3 12/02/2016 1332   K 3.8 11/09/2012 0820   CL 101 12/02/2016 1332   CL 105 11/09/2012 0820   CO2 24 12/02/2016 1332   CO2 28 11/09/2012 0820   GLUCOSE 89 12/02/2016 1332   GLUCOSE 98 10/13/2015 1603   GLUCOSE 123 (H) 11/09/2012 0820   BUN 12 12/02/2016 1332   BUN 14 11/09/2012 0820   CREATININE 0.98 12/02/2016 1332   CREATININE 0.95 10/13/2015 1603   CALCIUM 9.2 12/02/2016 1332   CALCIUM 9.3 11/09/2012 0820   GFRNONAA 80 12/02/2016 1332   GFRNONAA >60 11/09/2012 0820   GFRAA 92 12/02/2016 1332   GFRAA >60 11/09/2012 0820   CrCl cannot be calculated (Patient's most recent lab result is older than the maximum 21 days allowed.).  COAG No results found for: INR, PROTIME  Radiology No results found.   Assessment/Plan 1. Lymphedema I have had a long discussion with the patient regarding swelling and why it  causes symptoms.  Patient will begin wearing graduated compression stockings class 1 (20-30 mmHg) on a daily basis a prescription was given. The patient will  beginning wearing the stockings first thing in the morning and removing them in the evening. The patient is instructed specifically not to sleep in the stockings.   In addition, behavioral modification will be initiated.  This will include frequent elevation, use of over the counter pain medications and exercise such as walking.  I have reviewed systemic causes for chronic edema such as liver, kidney and cardiac etiologies.  The patient denies problems with these organ systems.    Consideration for a lymph pump will also be made based upon the  effectiveness of conservative therapy.  This would help to improve the edema control and prevent sequela such as ulcers and infections   Patient's duplex ultrasound of the venous system shows that DVT or reflux is not present.  The patient will follow-up with me PRN.  2. Essential hypertension Continue antihypertensive medications as already ordered, these medications have been reviewed and there are no changes at this time.   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 Malignant neoplasm of urinary bladder, unspecified site [facility]) Continue monitoring and treatment as already arranged  5. Mixed hyperlipidemia Continue statin as ordered and reviewed, no changes at this time     Hortencia Pilar, MD  09/15/2017 4:00 PM

## 2017-09-17 ENCOUNTER — Other Ambulatory Visit: Payer: Self-pay | Admitting: Podiatry

## 2017-09-17 ENCOUNTER — Other Ambulatory Visit: Payer: Self-pay | Admitting: Urology

## 2017-09-18 ENCOUNTER — Other Ambulatory Visit: Payer: Self-pay | Admitting: *Deleted

## 2017-09-18 MED ORDER — MELOXICAM 15 MG PO TABS: 15.0000 mg | ORAL_TABLET | Freq: Every day | ORAL | 3 refills | Status: DC

## 2017-10-13 ENCOUNTER — Other Ambulatory Visit: Payer: Self-pay | Admitting: Urology

## 2017-10-13 MED ORDER — NYSTATIN 100000 UNIT/GM EX CREA: 1.0000 "application " | TOPICAL_CREAM | Freq: Two times a day (BID) | CUTANEOUS | 0 refills | Status: DC

## 2017-10-13 NOTE — Progress Notes (Signed)
Nystatin cream script send to pharmacy.

## 2017-10-15 ENCOUNTER — Other Ambulatory Visit: Payer: Self-pay | Admitting: Internal Medicine

## 2017-10-20 ENCOUNTER — Encounter: Payer: Self-pay | Admitting: Urology

## 2017-10-20 ENCOUNTER — Ambulatory Visit (INDEPENDENT_AMBULATORY_CARE_PROVIDER_SITE_OTHER): Payer: Medicare Other | Admitting: Urology

## 2017-10-20 VITALS — BP 128/73 | HR 60 | Ht 66.0 in | Wt 141.0 lb

## 2017-10-20 DIAGNOSIS — R102 Pelvic and perineal pain: Secondary | ICD-10-CM | POA: Diagnosis not present

## 2017-10-20 LAB — URINALYSIS, COMPLETE
Bilirubin, UA: NEGATIVE
Glucose, UA: NEGATIVE
Ketones, UA: NEGATIVE
Nitrite, UA: NEGATIVE
Protein, UA: NEGATIVE
Specific Gravity, UA: 1.01 (ref 1.005–1.030)
Urobilinogen, Ur: 0.2 mg/dL (ref 0.2–1.0)
pH, UA: 6.5 (ref 5.0–7.5)

## 2017-10-20 LAB — MICROSCOPIC EXAMINATION: Epithelial Cells (non renal): NONE SEEN /hpf (ref 0–10)

## 2017-10-20 NOTE — Progress Notes (Signed)
10/20/2017 9:06 AM   Tony Cox 22-Oct-1950 [id number]  Referring provider: Crecencio Mc, MD [address] [place], [place] [address]  No chief complaint on file.   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    Background history Tony Cox is a 67 year old Caucasian male who is status post robotic assisted cystectomy/prostatectomy with ileo conduit diversion performed by Dr. Ardath Sax the on 01/28/2013 for muscle invasive bladder cancer status post neoadjuvant it chemotherapy who presents today for follow up visit.  His pathological diagnosis was pT2b TCC positive bladder cancer and pT2c NO Adenocarcinoma of the prostate who is currently being followed and managed by Dr Lovette Cliche at [facility] in [place].  He has had one urinary tract infection positive for enterococcus and Escherichia coli  on 10/01/2014. He does have stoma pain associated with a hernia for which he uses a compression belt and takes gabapentin and tramadol.  The belt can become uncomfortable when worn for long periods of time.  He also have significant discomfort when he is standing for long periods of time.  He has not had recent fevers, chills, nausea or vomiting.  He is not having difficulty with the stroma or foul drainage.  He has recently had a series of urethral washing which have returned atypical urothelial cells, suspicious cells and more recently atypical urothelial cells on 01/18/2016.  He has been on a protocol of imaging every three months (patient driven) with Dr. Darcel Bayley.  There has not been evidence of reoccurrence or metastasis.    Patient underwent urethrectomy with Dr. Darcel Bayley on 11/05/2016.  Pathology was benign.    He is being seen q 3 months at [facility] with Dr. Darcel Bayley.    He states that he was cleaning the head of his penis with a  wash cloth and irritated the skin.  He applied nystatin cream to the area and sought evaluation by his dermatologist.  His dermatologist stated he skin was well healed.  He continued to have the stinging sensation and started fluconazole.  He then switched to nitrofurantoin.    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  He was moving furniture, painting and hauling boxes.  He states this morning he is feeling worse with nausea this being a burning sensation to the penis scrotum and the perineal area.  He states his urine has been clear.  He has not had fevers or chills.  He has not noted any change in his bowel movements.  His UA today is negative.    PMH: Past Medical History:  Diagnosis Date  . Cancer ([place]) 2015   T2 uroepithelial bladder carcinoma   . Depression   . GERD (gastroesophageal reflux disease)   . Heart murmur   . History of atrial fibrillation without current medication    POST OP MITRIAL VALVE REPLACEMENT IN 2004  . 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 Hyperlipidemia   . Hypertension     Surgical History: Past Surgical History:  Procedure Laterality Date  . CARDIAC CATHETERIZATION    . CATARACT EXTRACTION W/PHACO Left 11/20/2015   Procedure: CATARACT EXTRACTION PHACO AND INTRAOCULAR LENS PLACEMENT (IOC);  Surgeon: Birder Robson, MD;  Location: [facility] ORS;  Service: Ophthalmology;  Laterality: Left;  [place] AP% CDE fluid pack lot # [id number]  . CHOLECYSTECTOMY  2011  . COLONOSCOPY  2009, 2013   Dr Vassie Moment ADENOMA   . COLONOSCOPY WITH PROPOFOL N/A 07/24/2016   Procedure: COLONOSCOPY WITH PROPOFOL;  Surgeon: Robert Bellow, MD;  Location: [facility] ENDOSCOPY;  Service: Endoscopy;  Laterality: N/A;  . cystectomy  2014   ileal conduit diversion   . HERNIA REPAIR  8185   umbilical   . TONSILLECTOMY   AS CHILD  . TRANSTHORACIC ECHOCARDIOGRAM  11-03-2008  DR FATH [name])   LVF NORMAL/ EF 55-60%/ LEFT ATRIAL ENLARGEMENT/  ADEQUATELY FUNCTIONING MITRAL VALVE REPAIR/  MILD MR & TRI/  NO SIGNIFICANT CHANGE FROM PREVIOUS ECHO  . TRANSURETHRAL RESECTION OF BLADDER TUMOR N/A 07/27/2012   Procedure: TRANSURETHRAL RESECTION OF BLADDER TUMOR (TURBT);  Surgeon: Franchot Gallo, MD;  Location: [facility];  Service: Urology;  Laterality: N/A;  WITH MITOMYCIN INSTILLATION   . urethroptomy  11/05/2016   [place]  . valvuloplasty with replacement of cosgrove ring,1 congenital ruptur of cordi  2004   [facility]   mitral valve     Home Medications:  Allergies as of 10/20/2017      Reactions   Codeine Nausea And Vomiting, Hives      Medication List        Accurate as of 10/20/17  9:06 AM. Always use your most recent med list.          carvedilol 10 MG 24 hr capsule Commonly known as:  COREG CR Take 10 mg by mouth every evening.   Clindamycin Phosphate foam APPLY 1 GRAM ON SKIN DAILY   desonide 0.05 % lotion Commonly known as:  DESOWEN   DEXILANT 60 MG capsule Generic drug:  dexlansoprazole TAKE 1 CAPSULE EVERY DAY   dicyclomine 10 MG capsule Commonly known as:  BENTYL Take 1 capsule (10 mg total) by mouth 2 (two) times daily.   gabapentin 300 MG capsule Commonly known as:  NEURONTIN TAKE 1 CAPSULE BY MOUTH 3 TIMES DAILY   hydrocortisone-pramoxine 2.5-1 % rectal cream Commonly known as:  ANALPRAM-HC APPLY A SMALL AMOUNT TWICE  A DAY AS NEEDED   meloxicam 15 MG tablet Commonly known as:  MOBIC Take 1 tablet (15 mg total) by mouth daily.   metroNIDAZOLE 1 % gel Commonly known as:  METROGEL   mirtazapine 7.5 MG tablet Commonly known as:  REMERON Take 1 tablet (7.5 mg total) by mouth at bedtime.   mirtazapine 15 MG disintegrating tablet Commonly known as:  REMERON SOL-TAB TAKE ONE TABLET BY MOUTH AT BEDTIME   nystatin powder Commonly known as:   MYCOSTATIN/NYSTOP Apply topically 4 (four) times daily.   nystatin cream Commonly known as:  MYCOSTATIN Apply 1 application topically 2 (two) times daily.   polyethylene glycol packet Commonly known as:  MIRALAX / GLYCOLAX Take 17 g by mouth as needed.   PROBIOTIC & ACIDOPHILUS EX ST PO Take 2 tablets by mouth at bedtime as needed.   traMADol 50 MG tablet Commonly known as:  ULTRAM Take 1 tablet (50 mg total) by mouth every 6 (six) hours as needed.   triamcinolone cream 0.1 % Commonly known as:  KENALOG       Allergies:  Allergies  Allergen Reactions  . Codeine Nausea And Vomiting and Hives    Family History: Family History  Adopted: Yes  Problem Relation Age of Onset  .  Colon cancer Mother   . Cancer Neg Hx        Prostate,Kidney,Bladder  . Prostate cancer Neg Hx   . Bladder Cancer Neg Hx   . Kidney cancer Neg Hx     Social History:  reports that he quit smoking about 5 years ago. He has never used smokeless tobacco. He reports that he drinks about 0.6 oz of alcohol per week. He reports that he does not use drugs.  ROS: UROLOGY Frequent Urination?: No Hard to postpone urination?: No Burning/pain with urination?: No Get up at night to urinate?: No Leakage of urine?: No Urine stream starts and stops?: No Trouble starting stream?: No Do you have to strain to urinate?: No Blood in urine?: No Urinary tract infection?: No Sexually transmitted disease?: No Injury to kidneys or bladder?: No Painful intercourse?: No Weak stream?: No Erection problems?: No Penile pain?: No Gastrointestinal Nausea?: No Vomiting?: No Indigestion/heartburn?: No Diarrhea?: No Constipation?: No Constitutional Fever: No Night sweats?: No Weight loss?: No Fatigue?: Yes Skin Skin rash/lesions?: No Itching?: No Eyes Blurred vision?: No Double vision?: No Ears/Nose/Throat Sore throat?: No Sinus problems?: No Hematologic/Lymphatic Swollen glands?: No Easy bruising?:  No Cardiovascular Leg swelling?: No Chest pain?: No Respiratory Cough?: No Shortness of breath?: No Endocrine Excessive thirst?: No Musculoskeletal Back pain?: No Joint pain?: No Neurological Headaches?: No Dizziness?: No Psychologic Depression?: No Anxiety?: No   Physical Exam: BP 128/73   Pulse 60   Ht 5\' 6"  (1.676 m)   Wt 141 lb (64 kg)   BMI 22.76 kg/m   Constitutional: Well nourished. Alert and oriented, No acute distress. HEENT: [place] AT, moist mucus membranes. Trachea midline, no masses. Cardiovascular: No clubbing, cyanosis, or edema. Respiratory: Normal respiratory effort, no increased work of breathing. GI: Abdomen is soft, non tender, non distended, no abdominal masses. Liver and spleen not palpable.  No hernias appreciated.  Stool sample for occult testing is not indicated.  Stoma is pink and healthy.   GU: No CVA tenderness.  No bladder fullness or masses.  Patient with uncircumcised phallus.  Foreskin easily retracted.   No penile discharge. No penile lesions or rashes. Scrotum without lesions, cysts, rashes and/or edema.  Testicles are located scrotally bilaterally. No masses are appreciated in the testicles. Left and right epididymis are normal. Rectal: Patient refused.  Skin: No rashes, bruises or suspicious lesions. Lymph: No cervical or inguinal adenopathy. Neurologic: Grossly intact, no focal deficits, moving all 4 extremities. Psychiatric: Normal mood and affect.  Laboratory Data: Results for orders placed or performed in visit on 12/02/16  Comprehensive metabolic panel  Result Value Ref Range   Glucose 89 65 - 99 mg/dL   BUN 12 8 - 27 mg/dL   Creatinine, Ser 0.98 0.76 - 1.27 mg/dL   GFR calc non Af Amer 80 >59 mL/min/1.73   GFR calc Af Amer 92 >59 mL/min/1.73   BUN/Creatinine Ratio 12 10 - 24   Sodium 140 134 - 144 mmol/L   Potassium 4.3 3.5 - 5.2 mmol/L   Chloride 101 96 - 106 mmol/L   CO2 24 20 - 29 mmol/L   Calcium 9.2 8.6 - 10.2 mg/dL    Total Protein 6.9 6.0 - 8.5 g/dL   Albumin 4.7 3.6 - 4.8 g/dL   Globulin, Total 2.2 1.5 - 4.5 g/dL   Albumin/Globulin Ratio 2.1 1.2 - 2.2   Bilirubin Total 0.3 0.0 - 1.2 mg/dL   Alkaline Phosphatase 76 39 - 117 IU/L   AST 18 0 -  40 IU/L   ALT 16 0 - 44 IU/L  CBC with Differential/Platelet  Result Value Ref Range   WBC 4.7 3.4 - 10.8 x10E3/uL   RBC 4.43 4.14 - 5.80 x10E6/uL   Hemoglobin 13.6 13.0 - 17.7 g/dL   Hematocrit 39.8 37.5 - 51.0 %   MCV 90 79 - 97 fL   MCH 30.7 26.6 - 33.0 pg   MCHC 34.2 31.5 - 35.7 g/dL   RDW 13.2 12.3 - 15.4 %   Platelets 205 150 - 379 x10E3/uL   Neutrophils 52 Not Estab. %   Lymphs 31 Not Estab. %   Monocytes 11 Not Estab. %   Eos 5 Not Estab. %   Basos 1 Not Estab. %   Neutrophils Absolute 2.4 1.4 - 7.0 x10E3/uL   Lymphocytes Absolute 1.5 0.7 - 3.1 x10E3/uL   Monocytes Absolute 0.5 0.1 - 0.9 x10E3/uL   EOS (ABSOLUTE) 0.3 0.0 - 0.4 x10E3/uL   Basophils Absolute 0.0 0.0 - 0.2 x10E3/uL   Immature Granulocytes 0 Not Estab. %   Immature Grans (Abs) 0.0 0.0 - 0.1 x10E3/uL   I have reviewed the labs  Assessment & Plan:   1. Bladder cancer:  Patient is status post robotic assisted cystectomy/prostatectomy with ileo conduit diversion performed by Dr. Ardath Sax the on 01/28/2013.  And now s/p urethrectomy by Dr. Darcel Bayley.  He is being followed at [facility] in [place].   He has an upcoming appointment on 10/25/2017.    2. Prostate cancer:   Patient is status post robotic assisted cystectomy/prostatectomy with ileo conduit diversion performed by Dr. Ardath Sax the on 01/28/2013.  He is being followed at [facility] in [place].    3. Perineal burning Exam is benign UA is negative - will send for culture Patient will also notify Dr. Haynes Bast office of symptoms Advised to contact our office or seek treatment in the ED if becomes febrile or pain/ vomiting are difficult control in order to arrange for emergent/urgent intervention  Return if symptoms  worsen or fail to improve.  Zara Council, PA-C  [facility] [address] [place], [place] [address] [phone]

## 2017-10-21 ENCOUNTER — Ambulatory Visit: Payer: Medicare Other | Admitting: Urology

## 2017-10-23 LAB — CULTURE, URINE COMPREHENSIVE

## 2017-11-11 ENCOUNTER — Ambulatory Visit: Payer: Medicare Other | Admitting: Urology

## 2017-11-24 ENCOUNTER — Other Ambulatory Visit: Payer: Self-pay

## 2017-11-24 ENCOUNTER — Ambulatory Visit: Payer: Medicare Other | Admitting: Urology

## 2017-11-24 ENCOUNTER — Encounter: Payer: Self-pay | Admitting: Urology

## 2017-11-24 VITALS — BP 126/66 | HR 64 | Ht 66.0 in | Wt 149.1 lb

## 2017-11-24 DIAGNOSIS — C61 Malignant neoplasm of prostate: Secondary | ICD-10-CM | POA: Diagnosis not present

## 2017-11-24 DIAGNOSIS — C679 Malignant neoplasm of bladder, unspecified: Secondary | ICD-10-CM | POA: Diagnosis not present

## 2017-11-24 DIAGNOSIS — N4889 Other specified disorders of penis: Secondary | ICD-10-CM

## 2017-11-27 ENCOUNTER — Other Ambulatory Visit: Payer: Self-pay | Admitting: *Deleted

## 2017-11-27 MED ORDER — MELOXICAM 15 MG PO TABS: 15.0000 mg | ORAL_TABLET | Freq: Every day | ORAL | 3 refills | Status: DC

## 2017-12-07 NOTE — Progress Notes (Signed)
11/24/2017 7:29 PM   Tony Cox 04-21-1951 [id number]  Referring provider: Crecencio Mc, MD [place], [place] [address]  Chief Complaint  Patient presents with  . 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    Background history Tony Cox is a 67 year old Caucasian male who is status post robotic assisted cystectomy/prostatectomy with ileo conduit diversion performed by Tony. Ardath Cox the on 01/28/2013 for muscle invasive bladder cancer status post neoadjuvant it chemotherapy who presents today for follow up visit.  His pathological diagnosis was pT2b TCC positive bladder cancer and pT2c NO Adenocarcinoma of the prostate who is currently being followed and managed by Tony Cox at [facility] Cox in [place].  He has had one urinary tract infection positive for enterococcus and Escherichia coli  on 10/01/2014. He does have stoma pain associated with a hernia for which he uses a compression belt and takes gabapentin and tramadol.  The belt can become uncomfortable when worn for long periods of time.  He also have significant discomfort when he is standing for long periods of time.  He has not had recent fevers, chills, nausea or vomiting.  He is not having difficulty with the stroma or foul drainage.  He has recently had a series of urethral washing which have returned atypical urothelial cells, suspicious cells and more recently atypical urothelial cells on 01/18/2016.  He has been on a protocol of imaging every three months (patient driven) with Tony Cox.  There has not been evidence of reoccurrence or metastasis.    Patient underwent urethrectomy with Tony Cox on 11/05/2016.  Pathology was benign.    He is being seen q 3 months at [facility] with [facility].    He recently had an  MRI on 10/29/2017 at [facility] which noted a new T2 hypodense peripherally enhancing lesion in the peripheral penile urethra, measuring 1.6 x 1.1 cm in the sagittal plane.  Probably postsurgical scarring and penile bulb.  He is very concerned about this finding and has discussed it at length with Tony Cox who is recommending a repeat MRI in September.  He states Tony Cox is not wanting to biopsy the area at this time as this may be the result of inflammation rather than a recurrence of his cancer.    PMH: Past Medical History:  Diagnosis Date  . Cancer ([place]) 2015   T2 uroepithelial bladder carcinoma   . Depression   . GERD (gastroesophageal reflux disease)   . Heart murmur   . History of atrial fibrillation without current medication    POST OP MITRIAL VALVE REPLACEMENT IN 2004  . 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 Hyperlipidemia   . Hypertension     Surgical History: Past Surgical History:  Procedure Laterality Date  . CARDIAC CATHETERIZATION    . CATARACT EXTRACTION W/PHACO Left 11/20/2015   Procedure: CATARACT EXTRACTION PHACO AND INTRAOCULAR LENS PLACEMENT (IOC);  Surgeon: Birder Robson, MD;  Location: [facility];  Service: Ophthalmology;  Laterality: Left;  [place] AP% CDE fluid pack lot # [id number]  . CHOLECYSTECTOMY  2011  . COLONOSCOPY  2009, 2013   Tony Vassie Moment ADENOMA   .  COLONOSCOPY WITH PROPOFOL N/A 07/24/2016   Procedure: COLONOSCOPY WITH PROPOFOL;  Surgeon: Robert Bellow, MD;  Location: [facility];  Service: Endoscopy;  Laterality: N/A;  . cystectomy  2014   ileal conduit diversion   . HERNIA REPAIR  9485   umbilical   . TONSILLECTOMY  AS CHILD  . TRANSTHORACIC ECHOCARDIOGRAM  11-03-2008  Tony FATH [name])   LVF NORMAL/ EF 55-60%/ LEFT ATRIAL ENLARGEMENT/  ADEQUATELY FUNCTIONING MITRAL VALVE REPAIR/  MILD MR & TRI/  NO SIGNIFICANT  CHANGE FROM PREVIOUS ECHO  . TRANSURETHRAL RESECTION OF BLADDER TUMOR N/A 07/27/2012   Procedure: TRANSURETHRAL RESECTION OF BLADDER TUMOR (TURBT);  Surgeon: Franchot Gallo, MD;  Location: [facility];  Service: Urology;  Laterality: N/A;  WITH MITOMYCIN INSTILLATION   . urethroptomy  11/05/2016   [place]  . valvuloplasty with replacement of cosgrove ring,1 congenital ruptur of cordi  2004   [facility]   mitral valve     Home Medications:  Allergies as of 11/24/2017      Reactions   Codeine Nausea And Vomiting, Hives      Medication List        Accurate as of 11/24/17 11:59 PM. Always use your most recent med list.          carvedilol 10 MG 24 hr capsule Commonly known as:  COREG CR Take 10 mg by mouth every evening.   Clindamycin Phosphate foam APPLY 1 GRAM ON SKIN DAILY   desonide 0.05 % lotion Commonly known as:  DESOWEN   DEXILANT 60 MG capsule Generic drug:  dexlansoprazole TAKE 1 CAPSULE EVERY DAY   dicyclomine 10 MG capsule Commonly known as:  BENTYL Take 1 capsule (10 mg total) by mouth 2 (two) times daily.   gabapentin 300 MG capsule Commonly known as:  NEURONTIN TAKE 1 CAPSULE BY MOUTH 3 TIMES DAILY   hydrocortisone-pramoxine 2.5-1 % rectal cream Commonly known as:  ANALPRAM-HC APPLY A SMALL AMOUNT TWICE  A DAY AS NEEDED   meloxicam 15 MG tablet Commonly known as:  MOBIC Take 1 tablet (15 mg total) by mouth daily.   metroNIDAZOLE 1 % gel Commonly known as:  METROGEL   mirtazapine 7.5 MG tablet Commonly known as:  REMERON Take 1 tablet (7.5 mg total) by mouth at bedtime.   mirtazapine 15 MG disintegrating tablet Commonly known as:  REMERON SOL-TAB TAKE ONE TABLET BY MOUTH AT BEDTIME   nystatin powder Commonly known as:  MYCOSTATIN/NYSTOP Apply topically 4 (four) times daily.   nystatin cream Commonly known as:  MYCOSTATIN Apply 1 application topically 2 (two) times daily.   polyethylene glycol packet Commonly known  as:  MIRALAX / GLYCOLAX Take 17 g by mouth as needed.   PROBIOTIC & ACIDOPHILUS EX ST PO Take 2 tablets by mouth at bedtime as needed.   traMADol 50 MG tablet Commonly known as:  ULTRAM Take 1 tablet (50 mg total) by mouth every 6 (six) hours as needed.   triamcinolone cream 0.1 % Commonly known as:  KENALOG       Allergies:  Allergies  Allergen Reactions  . Codeine Nausea And Vomiting and Hives    Family History: Family History  Adopted: Yes  Problem Relation Age of Onset  . Colon cancer Mother   . Cancer Neg Hx        Prostate,Kidney,Bladder  . Prostate cancer Neg Hx   . Bladder Cancer Neg Hx   . Kidney cancer Neg Hx     Social  History:  reports that he quit smoking about 5 years ago. He has never used smokeless tobacco. He reports that he drinks about 1.0 standard drinks of alcohol per week. He reports that he does not use drugs.  ROS: UROLOGY Frequent Urination?: No Hard to postpone urination?: No Burning/pain with urination?: No Get up at night to urinate?: No Leakage of urine?: No Urine stream starts and stops?: No Trouble starting stream?: No Do you have to strain to urinate?: No Blood in urine?: No Urinary tract infection?: No Sexually transmitted disease?: No Injury to kidneys or bladder?: No Painful intercourse?: No Weak stream?: No Erection problems?: No Penile pain?: Yes Gastrointestinal Nausea?: No Vomiting?: No Indigestion/heartburn?: No Diarrhea?: No Constipation?: No Constitutional Fever: No Night sweats?: No Weight loss?: No Fatigue?: No Skin Skin rash/lesions?: No Itching?: No Eyes Blurred vision?: No Double vision?: No Ears/Nose/Throat Sore throat?: No Sinus problems?: No Hematologic/Lymphatic Swollen glands?: No Easy bruising?: No Cardiovascular Leg swelling?: No Chest pain?: No Respiratory Cough?: No Shortness of breath?: No Endocrine Excessive thirst?: No Musculoskeletal Back pain?: No Joint pain?:  No Neurological Headaches?: No Dizziness?: No Psychologic Depression?: No Anxiety?: No   Physical Exam: BP 126/66   Pulse 64   Ht 5\' 6"  (1.676 m)   Wt 149 lb 1.6 oz (67.6 kg)   BMI 24.07 kg/m   Constitutional: Well nourished. Alert and oriented, No acute distress. HEENT: [place] AT, moist mucus membranes. Trachea midline, no masses. Cardiovascular: No clubbing, cyanosis, or edema. Respiratory: Normal respiratory effort, no increased work of breathing. GI: Abdomen is soft, non tender, non distended, no abdominal masses. Liver and spleen not palpable.  No hernias appreciated.  Stool sample for occult testing is not indicated.  Stoma is pink and healthy.   GU: No CVA tenderness.  No bladder fullness or masses.  Patient with uncircumcised phallus.  Foreskin easily retracted.   No penile discharge. No penile lesions or rashes. There is a firm area midshaft that is tender to palpation which may correlate to the area seen on MRI, scrotum without lesions, cysts, rashes and/or edema.  Testicles are located scrotally bilaterally. No masses are appreciated in the testicles. Left and right epididymis are normal. Rectal: Patient refused.  Skin: No rashes, bruises or suspicious lesions. Lymph: No cervical or inguinal adenopathy. Neurologic: Grossly intact, no focal deficits, moving all 4 extremities. Psychiatric: Normal mood and affect.  Laboratory Data: Results for orders placed or performed in visit on 10/20/17  CULTURE, URINE COMPREHENSIVE  Result Value Ref Range   Urine Culture, Comprehensive Final report (A)    Organism ID, Bacteria Enterococcus faecalis (A)    ANTIMICROBIAL SUSCEPTIBILITY Comment   Microscopic Examination  Result Value Ref Range   WBC, UA 0-5 0 - 5 /hpf   RBC, UA 0-2 0 - 2 /hpf   Epithelial Cells (non renal) None seen 0 - 10 /hpf   Mucus, UA Present (A) Not Estab.   Bacteria, UA Few (A) None seen/Few  Urinalysis, Complete  Result Value Ref Range   Specific Gravity,  UA 1.010 1.005 - 1.030   pH, UA 6.5 5.0 - 7.5   Color, UA Yellow Yellow   Appearance Ur Clear Clear   Leukocytes, UA 3+ (A) Negative   Protein, UA Negative Negative/Trace   Glucose, UA Negative Negative   Ketones, UA Negative Negative   RBC, UA Trace (A) Negative   Bilirubin, UA Negative Negative   Urobilinogen, Ur 0.2 0.2 - 1.0 mg/dL   Nitrite, UA Negative Negative  Microscopic Examination See below:    I have reviewed the labs  Assessment & Plan:   1. Bladder cancer:  Patient is status post robotic assisted cystectomy/prostatectomy with ileo conduit diversion performed by Tony. Ardath Cox the on 01/28/2013.  And now s/p urethrectomy by Tony Cox.  He is being followed at [facility] in [place].   He has an upcoming appointment in September over Labor day  2. Prostate cancer:   Patient is status post robotic assisted cystectomy/prostatectomy with ileo conduit diversion performed by Tony. Ardath Cox the on 01/28/2013.  He is being followed at [facility] in [place].    3. Penile pain Patient understandably is very concerned about the possibility of recurrence of his cancer.  I encouraged him to follow Tony. Haynes Bast advise and to return to [facility] for follow-up MRI of the area He will update me with the findings in September     Return if symptoms worsen or fail to improve.  Zara Council, PA-C  [facility] [address] [place], [place] [address] [phone]

## 2017-12-15 ENCOUNTER — Other Ambulatory Visit: Payer: Self-pay | Admitting: Internal Medicine

## 2018-02-23 ENCOUNTER — Telehealth: Payer: Self-pay | Admitting: Internal Medicine

## 2018-02-23 NOTE — Telephone Encounter (Signed)
Spoke with Tony Cox regarding AWV. Paitent declined to schedule at this time. SF.

## 2018-03-30 ENCOUNTER — Encounter: Payer: Self-pay | Admitting: Podiatry

## 2018-03-30 ENCOUNTER — Ambulatory Visit: Payer: Medicare Other | Admitting: Podiatry

## 2018-03-30 DIAGNOSIS — S90211A Contusion of right great toe with damage to nail, initial encounter: Secondary | ICD-10-CM | POA: Diagnosis not present

## 2018-03-30 MED ORDER — DOXYCYCLINE HYCLATE 100 MG PO TABS: 100.0000 mg | ORAL_TABLET | Freq: Two times a day (BID) | ORAL | 0 refills | Status: DC

## 2018-03-30 NOTE — Progress Notes (Signed)
Presents today after dropping something on his foot a couple of days ago and states that it hurts on the side he declined x-rays.  Objective: Vital signs are stable he is alert and oriented x3.  Pulses are palpable.  Hallux nail plate right demonstrates normal nail though it does demonstrate mild paronychia to the medial border there is no purulence but it appears to be a dried abscess present.  Nontender on palpation toenails are long thick yellow dystrophic clinically mycotic.  Pulses are strong palpable.  Assessment: Mild paronychia tibial border hallux right.  Plan: Discussed etiology pathology and surgical therapies at this point time prescribed Epsom salts and warm water soaks as well as a prescription for 7 days of doxycycline.  I will follow-up with him with questions or concerns.

## 2018-04-12 ENCOUNTER — Other Ambulatory Visit: Payer: Self-pay | Admitting: Internal Medicine

## 2018-07-11 ENCOUNTER — Telehealth: Payer: Self-pay | Admitting: Family Medicine

## 2018-07-11 MED ORDER — OSELTAMIVIR PHOSPHATE 75 MG PO CAPS: 75.0000 mg | ORAL_CAPSULE | Freq: Every day | ORAL | 0 refills | Status: DC

## 2018-07-11 NOTE — Telephone Encounter (Signed)
Treating patient's partner, Tony Cox, for influenza- through [facility]- have not been able to test due to current guidelines. Tony Cox is directly caring for Tony Cox. I am going to send in tamiflu prophylaxis for him.   They did travel to [place] 28th of February through march 1st so Tony Cox was advised to self quarantine and given covid separation guidelines.   Tony Cox

## 2018-07-12 NOTE — Telephone Encounter (Signed)
Thank you :)

## 2018-07-21 ENCOUNTER — Other Ambulatory Visit: Payer: Self-pay | Admitting: *Deleted

## 2018-07-21 MED ORDER — MELOXICAM 15 MG PO TABS: 15.0000 mg | ORAL_TABLET | Freq: Every day | ORAL | 3 refills | Status: DC

## 2018-08-07 ENCOUNTER — Other Ambulatory Visit: Payer: Self-pay | Admitting: Urology

## 2018-08-07 MED ORDER — FLUCONAZOLE 100 MG PO TABS: 100.0000 mg | ORAL_TABLET | Freq: Every day | ORAL | 0 refills | Status: DC

## 2018-08-25 ENCOUNTER — Other Ambulatory Visit: Payer: Self-pay | Admitting: Urology

## 2018-08-25 ENCOUNTER — Other Ambulatory Visit: Payer: Self-pay

## 2018-08-25 ENCOUNTER — Other Ambulatory Visit: Payer: Medicare Other

## 2018-08-25 DIAGNOSIS — N390 Urinary tract infection, site not specified: Secondary | ICD-10-CM

## 2018-08-25 LAB — URINALYSIS, COMPLETE
Bilirubin, UA: NEGATIVE
Glucose, UA: NEGATIVE
Ketones, UA: NEGATIVE
Leukocytes,UA: NEGATIVE
Nitrite, UA: POSITIVE — AB
Protein,UA: NEGATIVE
RBC, UA: NEGATIVE
Specific Gravity, UA: 1.01 (ref 1.005–1.030)
Urobilinogen, Ur: 0.2 mg/dL (ref 0.2–1.0)
pH, UA: 6.5 (ref 5.0–7.5)

## 2018-08-25 LAB — MICROSCOPIC EXAMINATION: RBC, Urine: NONE SEEN /hpf (ref 0–2)

## 2018-08-25 MED ORDER — NITROFURANTOIN MONOHYD MACRO 100 MG PO CAPS: 100.0000 mg | ORAL_CAPSULE | Freq: Two times a day (BID) | ORAL | 0 refills | Status: DC

## 2018-08-25 NOTE — Progress Notes (Signed)
Tony Cox have been having low grade fevers and malaise.  He will be coming in to provide an urine specimen today.  I have put the orders in for the UA and culture.

## 2018-08-25 NOTE — Progress Notes (Signed)
Per Zara Council, PAC urine today was sent for culture and patient was called and notified that Macrobid was sent into pharm to start today

## 2018-08-27 ENCOUNTER — Other Ambulatory Visit: Payer: Self-pay | Admitting: Urology

## 2018-08-27 MED ORDER — CEPHALEXIN 500 MG PO CAPS: 500.0000 mg | ORAL_CAPSULE | Freq: Two times a day (BID) | ORAL | 0 refills | Status: AC

## 2018-08-28 LAB — CULTURE, URINE COMPREHENSIVE

## 2018-08-31 ENCOUNTER — Telehealth: Payer: Self-pay | Admitting: Urology

## 2018-08-31 NOTE — Telephone Encounter (Signed)
Tony Cox contacted me today stating he is not feeling better.  He is still having left flank pain, but he is not having fevers.  His urine is clear and he is pushing fluids and having a good output.  He will contact Dr. Darcel Bayley to notify him of his symptoms and to review CT scan from February to see if there is any findings on the CT to explain his symptoms.  He will notify of the conclusion of his discussion with Dr. Darcel Bayley later today.

## 2018-09-01 ENCOUNTER — Other Ambulatory Visit: Payer: Self-pay | Admitting: Urology

## 2018-09-01 DIAGNOSIS — R109 Unspecified abdominal pain: Secondary | ICD-10-CM

## 2018-09-01 DIAGNOSIS — C679 Malignant neoplasm of bladder, unspecified: Secondary | ICD-10-CM

## 2018-09-01 NOTE — Progress Notes (Signed)
I have put orders in for a STAT CT Urogram for Tony Cox.  He is having left flank pain, diarrhea and has a history of bladder cancer and is s/p cystectomy.  He would like to avoid coming to the hospital, so if he can have it at the [facility] site that would be great.

## 2018-09-07 ENCOUNTER — Telehealth: Payer: Self-pay | Admitting: Urology

## 2018-09-07 ENCOUNTER — Ambulatory Visit
Admission: RE | Admit: 2018-09-07 | Discharge: 2018-09-07 | Disposition: A | Discharge status: Home / Self Care | Payer: Medicare Other | Source: Ambulatory Visit | Attending: Urology | Admitting: Urology

## 2018-09-07 ENCOUNTER — Other Ambulatory Visit: Payer: Self-pay

## 2018-09-07 DIAGNOSIS — R109 Unspecified abdominal pain: Secondary | ICD-10-CM

## 2018-09-07 DIAGNOSIS — C679 Malignant neoplasm of bladder, unspecified: Secondary | ICD-10-CM

## 2018-09-07 NOTE — Telephone Encounter (Signed)
Spoke with Tony Cox regarding not having his CT Urogram today.  He states he got lost on the way to the [employer] and arrived late for his CT appointment.  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  He would like to hold off undergoing his CT at this time as he will hopefully be seeing Dr. Darcel Bayley in the early part of June and he is feeling somewhat better.

## 2018-09-16 ENCOUNTER — Other Ambulatory Visit: Payer: Self-pay

## 2018-10-05 ENCOUNTER — Other Ambulatory Visit: Payer: Self-pay | Admitting: Urology

## 2018-10-05 ENCOUNTER — Other Ambulatory Visit: Payer: Self-pay | Admitting: Podiatry

## 2018-11-03 ENCOUNTER — Other Ambulatory Visit: Payer: Self-pay | Admitting: Family Medicine

## 2018-11-03 ENCOUNTER — Other Ambulatory Visit: Payer: Self-pay | Admitting: Urology

## 2018-11-03 MED ORDER — DEXILANT 60 MG PO CPDR: 1.0000 | DELAYED_RELEASE_CAPSULE | Freq: Every day | ORAL | 12 refills | Status: DC

## 2018-11-03 NOTE — Telephone Encounter (Signed)
Patient called and said he was trying to get a refill on this Dexilant and the pharmacy denied it. Wants to know if he can get it refilled?    Sharyn Lull

## 2018-11-25 ENCOUNTER — Other Ambulatory Visit: Payer: Self-pay | Admitting: Internal Medicine

## 2018-11-25 ENCOUNTER — Telehealth: Payer: Self-pay | Admitting: General Practice

## 2018-11-25 NOTE — Telephone Encounter (Signed)
Patient called the office after receiving the letter in regards to Dr. Bary Castilla, patient was offered  Gi to follow his care or any surgeon of his choice, or could speak with his pcp, patient did not decide and ended the call.

## 2018-11-26 NOTE — Telephone Encounter (Signed)
LMTCB. Need to schedule pt for a follow up visit. Pt has not been seen since 07/2017.

## 2018-12-11 ENCOUNTER — Other Ambulatory Visit: Payer: Self-pay | Admitting: Internal Medicine

## 2018-12-15 ENCOUNTER — Telehealth: Payer: Self-pay

## 2018-12-15 ENCOUNTER — Ambulatory Visit (INDEPENDENT_AMBULATORY_CARE_PROVIDER_SITE_OTHER): Payer: Medicare Other | Admitting: Internal Medicine

## 2018-12-15 ENCOUNTER — Encounter: Payer: Self-pay | Admitting: Internal Medicine

## 2018-12-15 ENCOUNTER — Other Ambulatory Visit: Payer: Self-pay

## 2018-12-15 ENCOUNTER — Other Ambulatory Visit: Payer: Self-pay | Admitting: Internal Medicine

## 2018-12-15 DIAGNOSIS — R300 Dysuria: Secondary | ICD-10-CM

## 2018-12-15 DIAGNOSIS — N3090 Cystitis, unspecified without hematuria: Secondary | ICD-10-CM

## 2018-12-15 LAB — URINALYSIS, ROUTINE W REFLEX MICROSCOPIC
Bilirubin Urine: NEGATIVE
Hgb urine dipstick: NEGATIVE
Ketones, ur: NEGATIVE
Leukocytes,Ua: NEGATIVE
Nitrite: NEGATIVE
Specific Gravity, Urine: 1.01 (ref 1.000–1.030)
Total Protein, Urine: NEGATIVE
Urine Glucose: NEGATIVE
Urobilinogen, UA: 0.2 (ref 0.0–1.0)
pH: 6 (ref 5.0–8.0)

## 2018-12-15 MED ORDER — GABAPENTIN 300 MG PO CAPS: ORAL_CAPSULE | ORAL | 3 refills | Status: DC

## 2018-12-15 MED ORDER — DEXILANT 60 MG PO CPDR: 1.0000 | DELAYED_RELEASE_CAPSULE | Freq: Every day | ORAL | 1 refills | Status: DC

## 2018-12-15 MED ORDER — MIRTAZAPINE 15 MG PO TBDP: 15.0000 mg | ORAL_TABLET | Freq: Every day | ORAL | 1 refills | Status: DC

## 2018-12-15 MED ORDER — DICYCLOMINE HCL 10 MG PO CAPS: 10.0000 mg | ORAL_CAPSULE | Freq: Two times a day (BID) | ORAL | 3 refills | Status: DC

## 2018-12-15 MED ORDER — METRONIDAZOLE 1 % EX GEL: Freq: Every day | CUTANEOUS | 5 refills | Status: DC

## 2018-12-15 MED ORDER — DEXILANT 60 MG PO CPDR: 1.0000 | DELAYED_RELEASE_CAPSULE | Freq: Every day | ORAL | 12 refills | Status: DC

## 2018-12-15 NOTE — Progress Notes (Signed)
Subjective:  Patient ID: Tony Cox, male    DOB: February 20, 1951  Age: 68 y.o. MRN: [id number]  CC: Diagnoses of Dysuria and Cystitis were pertinent to this visit.  HPI Tony Cox presents for evaluation of signs and symptoms associated with a UTI.  Fatigue, sleepiness forgetfulness,  Similar to Ma y episode u;   History of uroepithelial carcinoma of the bladder 2014  S/p cystectomy with urostomy ,   [facility] [place] last ultrasound Oct 2019 .cytology negative Feb 2020   Had a checkup in June at [facility] everything normal   Treated for UTI in May 2020 by local urology culture grew klebsiella , treated with keflex   s/p prostatectomy  UA with micro today is nornal   Outpatient Medications Prior to Visit  Medication Sig Dispense Refill  . carvedilol (COREG CR) 10 MG 24 hr capsule Take 10 mg by mouth every evening.    . Clindamycin Phosphate foam APPLY 1 GRAM ON SKIN DAILY  5  . desonide (DESOWEN) 0.05 % lotion     . hydrocortisone-pramoxine (ANALPRAM-HC) 2.5-1 % rectal cream APPLY A SMALL AMOUNT TWICE  A DAY AS NEEDED 30 g 0  . meloxicam (MOBIC) 15 MG tablet TAKE ONE TABLET BY MOUTH EVERY DAY 30 tablet 3  . nystatin (MYCOSTATIN/NYSTOP) powder Apply topically 4 (four) times daily. 15 g 4  . nystatin cream (MYCOSTATIN) Apply 1 application topically 2 (two) times daily. 30 g 0  . oseltamivir (TAMIFLU) 75 MG capsule Take 1 capsule (75 mg total) by mouth daily. For prophylaxis 10 capsule 0  . polyethylene glycol (MIRALAX / GLYCOLAX) packet Take 17 g by mouth as needed.     . Probiotic Product (PROBIOTIC & ACIDOPHILUS EX ST PO) Take 2 tablets by mouth at bedtime as needed.    . traMADol (ULTRAM) 50 MG tablet Take 1 tablet (50 mg total) by mouth every 6 (six) hours as needed. 30 tablet 0  . triamcinolone cream (KENALOG) 0.1 %     . dexlansoprazole (DEXILANT) 60 MG capsule Take 1 capsule (60 mg total) by mouth daily. 30 capsule 12  . dicyclomine (BENTYL) 10 MG capsule TAKE 1 CAPSULE TWICE  DAILY 180 capsule 3  . gabapentin (NEURONTIN) 300 MG capsule TAKE 1 CAPSULE 3 TIMES DAILY 270 capsule 1  . metroNIDAZOLE (METROGEL) 1 % gel     . mirtazapine (REMERON SOL-TAB) 15 MG disintegrating tablet TAKE ONE TABLET BY MOUTH AT BEDTIME 90 tablet 1  . mirtazapine (REMERON) 7.5 MG tablet Take 1 tablet (7.5 mg total) by mouth at bedtime. 90 tablet 3  . fluconazole (DIFLUCAN) 100 MG tablet Take 1 tablet (100 mg total) by mouth daily. X 7 days (Patient not taking: Reported on 12/15/2018) 7 tablet 0  . doxycycline (VIBRA-TABS) 100 MG tablet Take 1 tablet (100 mg total) by mouth 2 (two) times daily. (Patient not taking: Reported on 12/15/2018) 20 tablet 0  . nitrofurantoin, macrocrystal-monohydrate, (MACROBID) 100 MG capsule Take 1 capsule (100 mg total) by mouth every 12 (twelve) hours. (Patient not taking: Reported on 12/15/2018) 10 capsule 0   No facility-administered medications prior to visit.     Review of Systems;  Patient denies headache, fevers, malaise, unintentional weight loss, skin rash, eye pain, sinus congestion and sinus pain, sore throat, dysphagia,  hemoptysis , cough, dyspnea, wheezing, chest pain, palpitations, orthopnea, edema, abdominal pain, nausea, melena, diarrhea, constipation, flank pain, dysuria, hematuria, urinary  Frequency, nocturia, numbness, tingling, seizures,  Focal weakness, Loss of consciousness,  Tremor,  insomnia, depression, anxiety, and suicidal ideation.      Objective:  BP 122/70 (BP Location: Left Arm, Patient Position: Sitting, Cuff Size: Normal)   Pulse 70   Temp (!) 96.6 F (35.9 C) (Temporal)   Resp 15   Ht 5\' 6"  (1.676 m)   Wt 139 lb 12.8 oz (63.4 kg)   SpO2 98%   BMI 22.56 kg/m   BP Readings from Last 3 Encounters:  12/15/18 122/70  11/24/17 126/66  10/20/17 128/73    Wt Readings from Last 3 Encounters:  12/15/18 139 lb 12.8 oz (63.4 kg)  11/24/17 149 lb 1.6 oz (67.6 kg)  10/20/17 141 lb (64 kg)    General appearance: alert,  cooperative and appears stated age Ears: normal TM's and external ear canals both ears Throat: lips, mucosa, and tongue normal; teeth and gums normal Neck: no adenopathy, no carotid bruit, supple, symmetrical, trachea midline and thyroid not enlarged, symmetric, no tenderness/mass/nodules Back: symmetric, no curvature. ROM normal. No CVA tenderness. Lungs: clear to auscultation bilaterally Heart: regular rate and rhythm, S1, S2 normal, no murmur, click, rub or gallop Abdomen: soft, non-tender; bowel sounds normal; no masses,  no organomegaly cystostomy bag draining clar urine .  No heamturia,  stomach normal appearing  Pulses: 2+ and symmetric Skin: Skin color, texture, turgor normal. No rashes or lesions Lymph nodes: Cervical, supraclavicular, and axillary nodes normal.  No results found for: HGBA1C  Lab Results  Component Value Date   CREATININE 0.98 12/02/2016   CREATININE 0.95 10/13/2015   CREATININE 0.91 11/09/2012    Lab Results  Component Value Date   WBC 4.7 12/02/2016   HGB 13.6 12/02/2016   HCT 39.8 12/02/2016   PLT 205 12/02/2016   GLUCOSE 89 12/02/2016   CHOL 195 10/13/2015   TRIG 112 10/13/2015   HDL 63 10/13/2015   LDLDIRECT 135.5 01/16/2010   LDLCALC 110 10/13/2015   ALT 16 12/02/2016   AST 18 12/02/2016   NA 140 12/02/2016   K 4.3 12/02/2016   CL 101 12/02/2016   CREATININE 0.98 12/02/2016   BUN 12 12/02/2016   CO2 24 12/02/2016   TSH 1.20 10/13/2015   PSA 2.1 10/19/2012   MICROALBUR 0.7 07/07/2012    No results found.  Assessment & Plan:   Problem List Items Addressed This Visit      Unprioritized   Cystitis    UA is normal.  Will await culture from current specimen.  Last UTI was Klebsiella in May pansensitive       Other Visit Diagnoses    Dysuria          I have discontinued Tony Cox. Tony Cox mirtazapine, doxycycline, and nitrofurantoin (macrocrystal-monohydrate). I have also changed his mirtazapine, metroNIDAZOLE, gabapentin, and  dicyclomine. Additionally, I am having him maintain his carvedilol, desonide, hydrocortisone-pramoxine, Clindamycin Phosphate, polyethylene glycol, Probiotic Product (PROBIOTIC & ACIDOPHILUS EX ST PO), traMADol, nystatin, triamcinolone cream, nystatin cream, oseltamivir, fluconazole, meloxicam, and Dexilant.  Meds ordered this encounter  Medications  . mirtazapine (REMERON SOL-TAB) 15 MG disintegrating tablet    Sig: Take 1 tablet (15 mg total) by mouth at bedtime.    Dispense:  90 tablet    Refill:  1  . metroNIDAZOLE (METROGEL) 1 % gel    Sig: Apply topically daily.    Dispense:  60 g    Refill:  5  . gabapentin (NEURONTIN) 300 MG capsule    Sig: TAKE 1 CAPSULE 2 TIMES DAILY    Dispense:  180 capsule  Refill:  3    Giovanny IS OUT AND NEEDS REFILLS SO WE CAN FILL HIS BUBBLEPACKS    THANKS       HAVE A MERRY CHRISTMAS  . dicyclomine (BENTYL) 10 MG capsule    Sig: Take 1 capsule (10 mg total) by mouth 2 (two) times daily.    Dispense:  180 capsule    Refill:  3  . DISCONTD: dexlansoprazole (DEXILANT) 60 MG capsule    Sig: Take 1 capsule (60 mg total) by mouth daily.    Dispense:  30 capsule    Refill:  12    PROFILE FOR NEXT FILL  . dexlansoprazole (DEXILANT) 60 MG capsule    Sig: Take 1 capsule (60 mg total) by mouth daily.    Dispense:  90 capsule    Refill:  1    PROFILE FOR NEXT FILL    Medications Discontinued During This Encounter  Medication Reason  . doxycycline (VIBRA-TABS) 100 MG tablet Patient has not taken in last 30 days  . nitrofurantoin, macrocrystal-monohydrate, (MACROBID) 100 MG capsule Patient has not taken in last 30 days  . mirtazapine (REMERON) 7.5 MG tablet   . metroNIDAZOLE (METROGEL) 1 % gel Reorder  . mirtazapine (REMERON SOL-TAB) 15 MG disintegrating tablet   . dicyclomine (BENTYL) 10 MG capsule Reorder  . gabapentin (NEURONTIN) 300 MG capsule Reorder  . dexlansoprazole (DEXILANT) 60 MG capsule Reorder  . dexlansoprazole (DEXILANT) 60 MG capsule  Reorder    Follow-up: No follow-ups on file.   Crecencio Mc, MD

## 2018-12-15 NOTE — Telephone Encounter (Signed)
Pt states earliest he can get here for UA drop off is 11:30.  Pt is scheduled in the 4:30 slot. Please see note concerning med refill request as well.

## 2018-12-15 NOTE — Telephone Encounter (Signed)
LMTCB

## 2018-12-15 NOTE — Telephone Encounter (Signed)
You have a note in patient's chart on medication list with instructions to profile before next fill.  Is this ok to fill?  Patient has an in-person office visit with you today at 4:30 pm.

## 2018-12-15 NOTE — Telephone Encounter (Signed)
can you give him the 4:30 slot TODAY   and ask him to bring the urine in ASAP so we can get it processed

## 2018-12-15 NOTE — Telephone Encounter (Signed)
Pharmacy called for pt to request a refill of  mirtazapine (REMERON SOL-TAB) 15 MG disintegrating tablet  Pt has appt tomorrow for possible UTI.  Please advise. Thanks   [facility] - [place], [place] - [address] [phone] (Phone) [phone] (Fax)

## 2018-12-15 NOTE — Telephone Encounter (Signed)
LMTCB. Need to schedule pt for a follow up appt.

## 2018-12-15 NOTE — Assessment & Plan Note (Signed)
UA is normal.  Will await culture from current specimen.  Last UTI was Klebsiella in May pansensitive

## 2018-12-15 NOTE — Telephone Encounter (Signed)
Pt has a virtual visit tomorrow morning with Dr. Derrel Nip for a possible UTI. Pt is coming in this afternoon to leave a urine. Orders have been placed.

## 2018-12-16 ENCOUNTER — Ambulatory Visit: Payer: Medicare Other | Admitting: Internal Medicine

## 2018-12-17 LAB — URINE CULTURE
MICRO NUMBER:: 808532
SPECIMEN QUALITY:: ADEQUATE

## 2019-01-08 ENCOUNTER — Other Ambulatory Visit: Payer: Self-pay

## 2019-01-08 DIAGNOSIS — Z20822 Contact with and (suspected) exposure to covid-19: Secondary | ICD-10-CM

## 2019-01-09 LAB — NOVEL CORONAVIRUS, NAA: SARS-CoV-2, NAA: NOT DETECTED

## 2019-02-06 ENCOUNTER — Other Ambulatory Visit: Payer: Self-pay | Admitting: Urology

## 2019-02-06 MED ORDER — FLUCONAZOLE 100 MG PO TABS: 100.0000 mg | ORAL_TABLET | Freq: Every day | ORAL | 3 refills | Status: DC

## 2019-02-06 NOTE — Progress Notes (Signed)
Patient called request a refill for Diflucan for a yeast infection.  Refill sent to [facility].

## 2019-03-13 ENCOUNTER — Other Ambulatory Visit: Payer: Self-pay | Admitting: Podiatry

## 2019-04-10 ENCOUNTER — Other Ambulatory Visit: Payer: Self-pay | Admitting: Podiatry

## 2019-04-12 ENCOUNTER — Other Ambulatory Visit: Payer: Self-pay | Admitting: Podiatry

## 2019-04-12 MED ORDER — MELOXICAM 15 MG PO TABS: 15.0000 mg | ORAL_TABLET | Freq: Every day | ORAL | 3 refills | Status: DC

## 2019-04-14 ENCOUNTER — Ambulatory Visit: Payer: Medicare Other | Attending: Internal Medicine

## 2019-04-14 DIAGNOSIS — Z20822 Contact with and (suspected) exposure to covid-19: Secondary | ICD-10-CM

## 2019-04-15 LAB — NOVEL CORONAVIRUS, NAA: SARS-CoV-2, NAA: NOT DETECTED

## 2019-04-26 ENCOUNTER — Ambulatory Visit: Payer: Medicare PPO | Attending: Internal Medicine

## 2019-04-26 DIAGNOSIS — Z20822 Contact with and (suspected) exposure to covid-19: Secondary | ICD-10-CM | POA: Diagnosis not present

## 2019-04-27 LAB — NOVEL CORONAVIRUS, NAA: SARS-CoV-2, NAA: NOT DETECTED

## 2019-05-11 ENCOUNTER — Telehealth: Payer: Self-pay | Admitting: Urology

## 2019-05-12 ENCOUNTER — Ambulatory Visit: Payer: Medicare Other | Admitting: Podiatry

## 2019-05-14 ENCOUNTER — Other Ambulatory Visit: Payer: Medicare PPO

## 2019-05-17 ENCOUNTER — Ambulatory Visit: Payer: Medicare PPO | Attending: Internal Medicine

## 2019-05-17 DIAGNOSIS — Z20822 Contact with and (suspected) exposure to covid-19: Secondary | ICD-10-CM

## 2019-05-18 LAB — NOVEL CORONAVIRUS, NAA: SARS-CoV-2, NAA: NOT DETECTED

## 2019-05-20 ENCOUNTER — Other Ambulatory Visit: Payer: Self-pay | Admitting: Internal Medicine

## 2019-05-21 DIAGNOSIS — C680 Malignant neoplasm of urethra: Secondary | ICD-10-CM | POA: Diagnosis not present

## 2019-05-21 DIAGNOSIS — R918 Other nonspecific abnormal finding of lung field: Secondary | ICD-10-CM | POA: Diagnosis not present

## 2019-05-21 DIAGNOSIS — C61 Malignant neoplasm of prostate: Secondary | ICD-10-CM | POA: Diagnosis not present

## 2019-05-21 DIAGNOSIS — Z8546 Personal history of malignant neoplasm of prostate: Secondary | ICD-10-CM | POA: Diagnosis not present

## 2019-05-21 DIAGNOSIS — Z8551 Personal history of malignant neoplasm of bladder: Secondary | ICD-10-CM | POA: Diagnosis not present

## 2019-05-21 DIAGNOSIS — D303 Benign neoplasm of bladder: Secondary | ICD-10-CM | POA: Diagnosis not present

## 2019-05-21 DIAGNOSIS — C679 Malignant neoplasm of bladder, unspecified: Secondary | ICD-10-CM | POA: Diagnosis not present

## 2019-05-21 DIAGNOSIS — Z20828 Contact with and (suspected) exposure to other viral communicable diseases: Secondary | ICD-10-CM | POA: Diagnosis not present

## 2019-05-24 DIAGNOSIS — C679 Malignant neoplasm of bladder, unspecified: Secondary | ICD-10-CM | POA: Diagnosis not present

## 2019-05-24 DIAGNOSIS — R918 Other nonspecific abnormal finding of lung field: Secondary | ICD-10-CM | POA: Diagnosis not present

## 2019-05-24 DIAGNOSIS — C61 Malignant neoplasm of prostate: Secondary | ICD-10-CM | POA: Diagnosis not present

## 2019-05-24 DIAGNOSIS — Z8551 Personal history of malignant neoplasm of bladder: Secondary | ICD-10-CM | POA: Diagnosis not present

## 2019-05-24 DIAGNOSIS — C680 Malignant neoplasm of urethra: Secondary | ICD-10-CM | POA: Diagnosis not present

## 2019-05-24 DIAGNOSIS — Z8546 Personal history of malignant neoplasm of prostate: Secondary | ICD-10-CM | POA: Diagnosis not present

## 2019-05-24 DIAGNOSIS — D303 Benign neoplasm of bladder: Secondary | ICD-10-CM | POA: Diagnosis not present

## 2019-06-08 ENCOUNTER — Other Ambulatory Visit: Payer: Self-pay | Admitting: Urology

## 2019-06-08 ENCOUNTER — Other Ambulatory Visit: Payer: Medicare Other

## 2019-06-08 ENCOUNTER — Other Ambulatory Visit: Payer: Self-pay

## 2019-06-08 DIAGNOSIS — N390 Urinary tract infection, site not specified: Secondary | ICD-10-CM

## 2019-06-08 MED ORDER — CEPHALEXIN 500 MG PO CAPS: 500.0000 mg | ORAL_CAPSULE | Freq: Two times a day (BID) | ORAL | 0 refills | Status: AC

## 2019-06-09 ENCOUNTER — Other Ambulatory Visit: Payer: Medicare Other

## 2019-06-09 LAB — MICROSCOPIC EXAMINATION: RBC, Urine: NONE SEEN /hpf (ref 0–2)

## 2019-06-09 LAB — URINALYSIS, COMPLETE
Bilirubin, UA: NEGATIVE
Glucose, UA: NEGATIVE
Ketones, UA: NEGATIVE
Nitrite, UA: NEGATIVE
Protein,UA: NEGATIVE
RBC, UA: NEGATIVE
Specific Gravity, UA: <1.005 — ABNORMAL LOW (ref 1.005–1.030)
Urobilinogen, Ur: 0.2 mg/dL (ref 0.2–1.0)
pH, UA: 6 (ref 5.0–7.5)

## 2019-06-12 LAB — CULTURE, URINE COMPREHENSIVE

## 2019-07-17 DIAGNOSIS — Z8551 Personal history of malignant neoplasm of bladder: Secondary | ICD-10-CM | POA: Diagnosis not present

## 2019-07-17 DIAGNOSIS — C680 Malignant neoplasm of urethra: Secondary | ICD-10-CM | POA: Diagnosis not present

## 2019-07-17 DIAGNOSIS — C61 Malignant neoplasm of prostate: Secondary | ICD-10-CM | POA: Diagnosis not present

## 2019-07-17 DIAGNOSIS — Z20828 Contact with and (suspected) exposure to other viral communicable diseases: Secondary | ICD-10-CM | POA: Diagnosis not present

## 2019-07-17 DIAGNOSIS — C679 Malignant neoplasm of bladder, unspecified: Secondary | ICD-10-CM | POA: Diagnosis not present

## 2019-07-17 DIAGNOSIS — Z8546 Personal history of malignant neoplasm of prostate: Secondary | ICD-10-CM | POA: Diagnosis not present

## 2019-07-19 DIAGNOSIS — Z8546 Personal history of malignant neoplasm of prostate: Secondary | ICD-10-CM | POA: Diagnosis not present

## 2019-07-19 DIAGNOSIS — Z8551 Personal history of malignant neoplasm of bladder: Secondary | ICD-10-CM | POA: Diagnosis not present

## 2019-07-19 DIAGNOSIS — C679 Malignant neoplasm of bladder, unspecified: Secondary | ICD-10-CM | POA: Diagnosis not present

## 2019-07-19 DIAGNOSIS — C61 Malignant neoplasm of prostate: Secondary | ICD-10-CM | POA: Diagnosis not present

## 2019-07-19 DIAGNOSIS — C680 Malignant neoplasm of urethra: Secondary | ICD-10-CM | POA: Diagnosis not present

## 2019-07-20 DIAGNOSIS — Z8551 Personal history of malignant neoplasm of bladder: Secondary | ICD-10-CM | POA: Diagnosis not present

## 2019-07-20 DIAGNOSIS — Z8546 Personal history of malignant neoplasm of prostate: Secondary | ICD-10-CM | POA: Diagnosis not present

## 2019-07-20 DIAGNOSIS — C61 Malignant neoplasm of prostate: Secondary | ICD-10-CM | POA: Diagnosis not present

## 2019-07-20 DIAGNOSIS — C680 Malignant neoplasm of urethra: Secondary | ICD-10-CM | POA: Diagnosis not present

## 2019-07-20 DIAGNOSIS — C679 Malignant neoplasm of bladder, unspecified: Secondary | ICD-10-CM | POA: Diagnosis not present

## 2019-07-28 DIAGNOSIS — R600 Localized edema: Secondary | ICD-10-CM | POA: Diagnosis not present

## 2019-07-28 DIAGNOSIS — I059 Rheumatic mitral valve disease, unspecified: Secondary | ICD-10-CM | POA: Diagnosis not present

## 2019-07-28 DIAGNOSIS — R000 Tachycardia, unspecified: Secondary | ICD-10-CM | POA: Diagnosis not present

## 2019-07-28 DIAGNOSIS — E782 Mixed hyperlipidemia: Secondary | ICD-10-CM | POA: Diagnosis not present

## 2019-08-10 DIAGNOSIS — Z936 Other artificial openings of urinary tract status: Secondary | ICD-10-CM | POA: Diagnosis not present

## 2019-08-16 DIAGNOSIS — I059 Rheumatic mitral valve disease, unspecified: Secondary | ICD-10-CM | POA: Diagnosis not present

## 2019-08-16 DIAGNOSIS — R002 Palpitations: Secondary | ICD-10-CM | POA: Diagnosis not present

## 2019-08-26 DIAGNOSIS — E782 Mixed hyperlipidemia: Secondary | ICD-10-CM | POA: Diagnosis not present

## 2019-08-26 DIAGNOSIS — I059 Rheumatic mitral valve disease, unspecified: Secondary | ICD-10-CM | POA: Diagnosis not present

## 2019-08-26 DIAGNOSIS — R002 Palpitations: Secondary | ICD-10-CM | POA: Diagnosis not present

## 2019-08-26 DIAGNOSIS — R600 Localized edema: Secondary | ICD-10-CM | POA: Diagnosis not present

## 2019-09-06 ENCOUNTER — Other Ambulatory Visit: Payer: Self-pay

## 2019-09-08 ENCOUNTER — Ambulatory Visit (INDEPENDENT_AMBULATORY_CARE_PROVIDER_SITE_OTHER): Payer: Medicare PPO | Admitting: Internal Medicine

## 2019-09-08 ENCOUNTER — Encounter: Payer: Self-pay | Admitting: Internal Medicine

## 2019-09-08 ENCOUNTER — Other Ambulatory Visit: Payer: Self-pay

## 2019-09-08 VITALS — BP 116/68 | HR 68 | Temp 97.9°F | Resp 14 | Ht 66.0 in | Wt 148.0 lb

## 2019-09-08 DIAGNOSIS — Z9889 Other specified postprocedural states: Secondary | ICD-10-CM | POA: Diagnosis not present

## 2019-09-08 DIAGNOSIS — Z0000 Encounter for general adult medical examination without abnormal findings: Secondary | ICD-10-CM

## 2019-09-08 DIAGNOSIS — E782 Mixed hyperlipidemia: Secondary | ICD-10-CM

## 2019-09-08 DIAGNOSIS — F320 Major depressive disorder, single episode, mild: Secondary | ICD-10-CM | POA: Diagnosis not present

## 2019-09-08 DIAGNOSIS — C679 Malignant neoplasm of bladder, unspecified: Secondary | ICD-10-CM | POA: Diagnosis not present

## 2019-09-08 DIAGNOSIS — R002 Palpitations: Secondary | ICD-10-CM

## 2019-09-08 MED ORDER — DICYCLOMINE HCL 10 MG PO CAPS: 10.0000 mg | ORAL_CAPSULE | Freq: Two times a day (BID) | ORAL | 3 refills | Status: DC

## 2019-09-08 MED ORDER — DEXILANT 60 MG PO CPDR: 1.0000 | DELAYED_RELEASE_CAPSULE | Freq: Every day | ORAL | 1 refills | Status: DC

## 2019-09-08 MED ORDER — MIRTAZAPINE 15 MG PO TBDP: 15.0000 mg | ORAL_TABLET | Freq: Every day | ORAL | 3 refills | Status: DC

## 2019-09-08 MED ORDER — GABAPENTIN 300 MG PO CAPS: ORAL_CAPSULE | ORAL | 3 refills | Status: DC

## 2019-09-08 MED ORDER — METRONIDAZOLE 1 % EX GEL: Freq: Every day | CUTANEOUS | 5 refills | Status: DC

## 2019-09-08 NOTE — Progress Notes (Signed)
Patient ID: Tony Cox, male    DOB: 03/29/1951  Age: 69 y.o. MRN: [id number]  The patient is here for annual comprehensive  examination and management of other chronic and acute problems.   The risk factors are reflected in the social history.  The roster of all physicians providing medical care to patient - is listed in the Snapshot section of the chart.  Activities of daily living:  The patient is 100% independent in all ADLs: dressing, toileting, feeding as well as independent mobility  Home safety : The patient has smoke detectors in the home. They wear seatbelts.  There are no firearms at home. There is no violence in the home.   There is no risks for hepatitis, STDs or HIV. There is no   history of blood transfusion. They have no travel history to infectious disease endemic areas of the world.  The patient has seen their dentist in the last six month. They have seen their eye doctor in the last year. He denies any  hearing difficulty with regard to whispered voices and some television programs.  They have deferred audiologic testing in the last year.  They do not  have excessive sun exposure. Discussed the need for sun protection: hats, long sleeves and use of sunscreen if there is significant sun exposure.   Diet: the importance of a healthy diet is discussed. They do have a healthy diet.  The benefits of regular aerobic exercise were discussed. he walks 4 times per week ,  20 minutes.   Depression screen: there are no vegative symptoms of depression- irritability, change in appetite, anhedonia   Cognitive assessment: the patient manages all their financial and personal affairs and is actively engaged. They could relate day,date,year and events; recalled 2/3 objects at 3 minutes; performed clock-face test normally.  The following portions of the patient's history were reviewed and updated as appropriate: allergies, current medications, past family history, past medical history,   past surgical history, past social history  and problem list.  Visual acuity was not assessed per patient preference since she has regular follow up with her ophthalmologist. Hearing and body mass index were assessed and reviewed.   During the course of the visit the patient was educated and counseled about appropriate screening and preventive services including : fall prevention , diabetes screening, nutrition counseling, colorectal cancer screening, and recommended immunizations.    CC: The primary encounter diagnosis was Mixed hyperlipidemia. Diagnoses of History of mitral valve repair, Palpitations, Depression, major, single episode, mild ([place]), Malignant neoplasm of urinary bladder, unspecified site [facility]), and Visit for preventive health examination were also pertinent to this visit.   Positive depression screen. 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  Having difficulty getting time off for his oncology follow up evaluations at [facility] in [place].   Still grieving loss of mother after caring for her for 70 years.    Palpitations Unable to tolerate metoprolol and carvedilol. Short acting .  But carvedilol CR not covered by insurance even though   it is ithe only beta blocker he tolerates .  Has not tried bystolic.    History Tony Cox has a past medical history of Cancer ([place]) (2015), Depression, GERD (gastroesophageal reflux disease), Heart murmur, History of atrial fibrillation without current medication, History of mitral valve repair (CARDIOLOGIST-- DR FATH--  LOV NOTE W/ CHART 10-30-2011), Hyperlipidemia, and Hypertension.   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   His family history includes Colon cancer in his mother. He was adopted.He reports that he quit smoking about 7 years ago. He has never used smokeless tobacco. He reports current alcohol use of about 1.0 standard drinks of alcohol per week. He reports that he does not use drugs.  Outpatient Medications Prior to Visit  Medication Sig Dispense Refill  . carvedilol (COREG CR) 10 MG 24 hr capsule Take 10 mg by mouth every evening.    . Clindamycin Phosphate foam APPLY 1 GRAM ON SKIN DAILY  5  . desonide (DESOWEN) 0.05 % lotion     . hydrocortisone-pramoxine (ANALPRAM-HC) 2.5-1 % rectal cream APPLY A SMALL AMOUNT TWICE  A DAY AS NEEDED 30 g 0  . meloxicam (MOBIC) 15 MG tablet TAKE ONE TABLET BY MOUTH EVERY DAY 30 tablet 1  . nystatin (MYCOSTATIN/NYSTOP) powder Apply topically 4 (four) times daily. 15 g 4  . nystatin cream (MYCOSTATIN) Apply 1 application topically 2 (two) times daily. 30 g 0  . polyethylene glycol (MIRALAX / GLYCOLAX) packet Take 17 g by mouth as needed.     . Probiotic Product (PROBIOTIC & ACIDOPHILUS EX ST PO) Take 2 tablets by mouth at bedtime as needed.    . traMADol (ULTRAM) 50 MG tablet Take 1 tablet (50 mg total) by mouth every 6 (six) hours as needed. 30 tablet 0  . triamcinolone cream (KENALOG) 0.1 %     . dexlansoprazole (DEXILANT) 60 MG capsule Take 1 capsule (60 mg total) by mouth daily. 90 capsule 1  . dicyclomine (BENTYL) 10 MG capsule Take 1 capsule (10 mg total) by mouth 2 (two) times daily. 180 capsule 3  . gabapentin (NEURONTIN) 300 MG capsule TAKE 1 CAPSULE 2 TIMES DAILY 180 capsule 3  . metroNIDAZOLE (METROGEL) 1 % gel Apply topically daily. 60 g 5  . mirtazapine (REMERON SOL-TAB) 15 MG disintegrating tablet DISSOLVE 1 TABLET UNDER THE TONGUE AT BEDTIME 90 tablet 1  .  fluconazole (DIFLUCAN) 100 MG tablet Take 1 tablet (100 mg total) by mouth daily. X 7 days (Patient not taking: Reported on 09/08/2019) 7 tablet 3  . meloxicam (MOBIC) 15 MG tablet Take 1 tablet (15 mg total) by mouth daily. (Patient not taking: Reported on 09/08/2019) 90 tablet 3  . oseltamivir (TAMIFLU) 75 MG capsule Take 1 capsule (75 mg total) by mouth daily. For prophylaxis (Patient not taking: Reported on 09/08/2019) 10 capsule 0   No facility-administered medications prior to visit.    Review of Systems   Patient denies headache, fevers, malaise, unintentional weight loss, skin rash, eye pain, sinus congestion and sinus pain, sore throat, dysphagia,  hemoptysis , cough, dyspnea, wheezing, chest pain, palpitations, orthopnea, edema, abdominal pain, nausea, melena, diarrhea, constipation, flank pain, dysuria, hematuria, urinary  Frequency, nocturia, numbness, tingling, seizures,  Focal weakness, Loss of consciousness,  Tremor, insomnia, depression, anxiety, and suicidal ideation.      Objective:  BP 116/68 (BP Location: Left Arm, Patient Position: Sitting, Cuff Size: Normal)   Pulse 68   Temp 97.9 F (36.6 C) (Temporal)   Resp 14   Ht 5\' 6"  (1.676 m)   Wt 148 lb (67.1 kg)   SpO2 97%   BMI 23.89  kg/m   Physical Exam  General appearance: alert, cooperative and appears stated age Ears: normal TM's and external ear canals both ears Throat: lips, mucosa, and tongue normal; teeth and gums normal Neck: no adenopathy, no carotid bruit, supple, symmetrical, trachea midline and thyroid not enlarged, symmetric, no tenderness/mass/nodules Back: symmetric, no curvature. ROM normal. No CVA tenderness. Lungs: clear to auscultation bilaterally Heart: regular rate and rhythm, S1, S2 normal, no murmur, click, rub or gallop Abdomen: soft, non-tender; bowel sounds normal; cystostomy stoma in good condition,   Draining pale yellow urine. no masses,  no organomegaly Pulses: 2+ and symmetric Skin:  Skin color, texture, turgor normal. No rashes or lesions Lymph nodes: Cervical, supraclavicular, and axillary nodes normal. Psych: affect normal, makes good eye contact. No fidgeting,    Denies suicidal thoughts   Assessment & Plan:   Problem List Items Addressed This Visit      Unprioritized   Depression, major, single episode, mild ([place])    Secondary to unresolved grief from loss of mother compounded by dissatisfaction with his current professional situation.  He defers medication at this time.       Relevant Medications   mirtazapine (REMERON SOL-TAB) 15 MG disintegrating tablet   History of mitral valve repair   Hyperlipidemia, unspecified - Primary   Relevant Medications   nebivolol (BYSTOLIC) 5 MG tablet   Other Relevant Orders   Comprehensive metabolic panel (Completed)   Lipid panel (Completed)   Malignant neoplasm of bladder (HCC)    T2 urothelial carcinoma of bladder, s/p neoadjuvant chemo, radical cystectomy with ilealconduit diversion.  No recurrence at 4 years.       Palpitations    He has been able to control them on Coreg CR but the cost is prohibitive.  Metoprolol and carvedilol were not tolerated.  Dr Ubaldo Glassing agrees that a trial of nebivolol is appropriate      Visit for preventive health examination    age appropriate education and counseling updated, referrals for preventative services and immunizations addressed, dietary and smoking counseling addressed, most recent labs reviewed.  I have personally reviewed and have noted:  1) the patient's medical and social history 2) The pt's use of alcohol, tobacco, and illicit drugs 3) The patient's current medications and supplements 4) Functional ability including ADL's, fall risk, home safety risk, hearing and visual impairment 5) Diet and physical activities 6) Evidence for depression or mood disorder 7) The patient's height, weight, and BMI have been recorded in the chart  I have made referrals, and provided  counseling and education based on review of the above         I have discontinued Glade Nurse. Crumby's oseltamivir. I have also changed his mirtazapine. Additionally, I am having him start on nebivolol. Lastly, I am having him maintain his carvedilol, desonide, hydrocortisone-pramoxine, Clindamycin Phosphate, polyethylene glycol, Probiotic Product (PROBIOTIC & ACIDOPHILUS EX ST PO), traMADol, nystatin, triamcinolone cream, nystatin cream, fluconazole, meloxicam, Dexilant, dicyclomine, gabapentin, and metroNIDAZOLE.  Meds ordered this encounter  Medications  . dexlansoprazole (DEXILANT) 60 MG capsule    Sig: Take 1 capsule (60 mg total) by mouth daily.    Dispense:  90 capsule    Refill:  1    KEEP ON FILE FOR FUTURE REFILLS  . dicyclomine (BENTYL) 10 MG capsule    Sig: Take 1 capsule (10 mg total) by mouth 2 (two) times daily.    Dispense:  180 capsule    Refill:  3  . gabapentin (NEURONTIN) 300 MG capsule  Sig: TAKE 1 CAPSULE 2 TIMES DAILY    Dispense:  180 capsule    Refill:  3    KEEP ON FILE FOR FUTURE REFILLS  . mirtazapine (REMERON SOL-TAB) 15 MG disintegrating tablet    Sig: Take 1 tablet (15 mg total) by mouth at bedtime.    Dispense:  90 tablet    Refill:  3    REFILLS FOR NEXT TIME THANKS  . metroNIDAZOLE (METROGEL) 1 % gel    Sig: Apply topically daily.    Dispense:  60 g    Refill:  5  . nebivolol (BYSTOLIC) 5 MG tablet    Sig: Take 1 tablet (5 mg total) by mouth daily.    Dispense:  30 tablet    Refill:  0    Medications Discontinued During This Encounter  Medication Reason  . meloxicam (MOBIC) 15 MG tablet Duplicate  . oseltamivir (TAMIFLU) 75 MG capsule Completed Course  . gabapentin (NEURONTIN) 300 MG capsule Reorder  . dicyclomine (BENTYL) 10 MG capsule Reorder  . dexlansoprazole (DEXILANT) 60 MG capsule Reorder  . mirtazapine (REMERON SOL-TAB) 15 MG disintegrating tablet Reorder  . metroNIDAZOLE (METROGEL) 1 % gel Reorder    Follow-up: No  follow-ups on file.   Crecencio Mc, MD

## 2019-09-09 ENCOUNTER — Telehealth: Payer: Self-pay | Admitting: Internal Medicine

## 2019-09-09 DIAGNOSIS — F320 Major depressive disorder, single episode, mild: Secondary | ICD-10-CM | POA: Insufficient documentation

## 2019-09-09 DIAGNOSIS — R002 Palpitations: Secondary | ICD-10-CM | POA: Insufficient documentation

## 2019-09-09 DIAGNOSIS — Z8679 Personal history of other diseases of the circulatory system: Secondary | ICD-10-CM | POA: Insufficient documentation

## 2019-09-09 LAB — LIPID PANEL
Cholesterol: 161 mg/dL (ref 0–200)
HDL: 44.8 mg/dL (ref >39.00)
LDL Cholesterol: 83 mg/dL (ref 0–99)
NonHDL: 116.47
Total CHOL/HDL Ratio: 4
Triglycerides: 166 mg/dL — ABNORMAL HIGH (ref 0.0–149.0)
VLDL: 33.2 mg/dL (ref 0.0–40.0)

## 2019-09-09 LAB — COMPREHENSIVE METABOLIC PANEL
ALT: 19 U/L (ref 0–53)
AST: 17 U/L (ref 0–37)
Albumin: 4.5 g/dL (ref 3.5–5.2)
Alkaline Phosphatase: 68 U/L (ref 39–117)
BUN: 13 mg/dL (ref 6–23)
CO2: 30 mEq/L (ref 19–32)
Calcium: 9.2 mg/dL (ref 8.4–10.5)
Chloride: 103 mEq/L (ref 96–112)
Creatinine, Ser: 0.8 mg/dL (ref 0.40–1.50)
GFR: 95.77 mL/min (ref >60.00)
Glucose, Bld: 91 mg/dL (ref 70–99)
Potassium: 4.1 mEq/L (ref 3.5–5.1)
Sodium: 139 mEq/L (ref 135–145)
Total Bilirubin: 0.3 mg/dL (ref 0.2–1.2)
Total Protein: 6.6 g/dL (ref 6.0–8.3)

## 2019-09-09 MED ORDER — NEBIVOLOL HCL 5 MG PO TABS: 5.0000 mg | ORAL_TABLET | Freq: Every day | ORAL | 0 refills | Status: DC

## 2019-09-09 NOTE — Assessment & Plan Note (Signed)
T2 urothelial carcinoma of bladder, s/p neoadjuvant chemo, radical cystectomy with ilealconduit diversion.  No recurrence at 4 years.

## 2019-09-09 NOTE — Assessment & Plan Note (Signed)

## 2019-09-09 NOTE — Assessment & Plan Note (Signed)
He has been able to control them on Coreg CR but the cost is prohibitive.  Metoprolol and carvedilol were not tolerated.  Dr Ubaldo Glassing agrees that a trial of nebivolol is appropriate

## 2019-09-09 NOTE — Assessment & Plan Note (Signed)
Secondary to unresolved grief from loss of mother compounded by dissatisfaction with his current professional situation.  He defers medication at this time.

## 2019-09-28 ENCOUNTER — Other Ambulatory Visit: Payer: Self-pay | Admitting: Urology

## 2019-09-28 ENCOUNTER — Telehealth: Payer: Self-pay

## 2019-09-28 DIAGNOSIS — Z936 Other artificial openings of urinary tract status: Secondary | ICD-10-CM | POA: Diagnosis not present

## 2019-09-28 MED ORDER — DOXYCYCLINE HYCLATE 100 MG PO TABS: 100.0000 mg | ORAL_TABLET | Freq: Two times a day (BID) | ORAL | 1 refills | Status: AC

## 2019-09-28 MED ORDER — CEPHALEXIN 500 MG PO CAPS: 500.0000 mg | ORAL_CAPSULE | Freq: Four times a day (QID) | ORAL | 0 refills | Status: AC

## 2019-09-28 NOTE — Telephone Encounter (Signed)
Pt advised Doxycycline 100 mg take 1 tablet po bid #28 1 RF erx'd to [facility]

## 2019-10-16 ENCOUNTER — Other Ambulatory Visit: Payer: Self-pay | Admitting: Dermatology

## 2019-11-01 DIAGNOSIS — D303 Benign neoplasm of bladder: Secondary | ICD-10-CM | POA: Diagnosis not present

## 2019-11-01 DIAGNOSIS — C61 Malignant neoplasm of prostate: Secondary | ICD-10-CM | POA: Diagnosis not present

## 2019-11-01 DIAGNOSIS — Z8551 Personal history of malignant neoplasm of bladder: Secondary | ICD-10-CM | POA: Diagnosis not present

## 2019-11-01 DIAGNOSIS — C679 Malignant neoplasm of bladder, unspecified: Secondary | ICD-10-CM | POA: Diagnosis not present

## 2019-11-01 DIAGNOSIS — C680 Malignant neoplasm of urethra: Secondary | ICD-10-CM | POA: Diagnosis not present

## 2019-11-01 DIAGNOSIS — Z8546 Personal history of malignant neoplasm of prostate: Secondary | ICD-10-CM | POA: Diagnosis not present

## 2019-11-11 DIAGNOSIS — Z8551 Personal history of malignant neoplasm of bladder: Secondary | ICD-10-CM | POA: Diagnosis not present

## 2019-11-11 DIAGNOSIS — Z8546 Personal history of malignant neoplasm of prostate: Secondary | ICD-10-CM | POA: Diagnosis not present

## 2019-11-15 DIAGNOSIS — Z8546 Personal history of malignant neoplasm of prostate: Secondary | ICD-10-CM | POA: Diagnosis not present

## 2019-11-15 DIAGNOSIS — Z8551 Personal history of malignant neoplasm of bladder: Secondary | ICD-10-CM | POA: Diagnosis not present

## 2019-12-15 ENCOUNTER — Ambulatory Visit: Payer: Medicare PPO | Admitting: Urology

## 2019-12-15 NOTE — Progress Notes (Signed)
12/16/2019 9:52 PM   [name] [birth year] [id number]  Referring provider: Crecencio Mc, MD [address],  [place] [address]  Chief Complaint  Patient presents with  . 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   Status post robotic assisted cystectomy/prostatectomy with ileo conduit diversion performed by Dr. Ardath Sax the on 01/28/2013 for muscle invasive bladder cancer status post neoadjuvant it chemotherapy.  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    Patient underwent urethrectomy with Dr. Darcel Bayley on 11/05/2016.  Pathology was benign.    Patient is having some lower abdominal pain and lower back pain who presents today to see if his symptoms are caused by an UTI.  He is having more issues with lower abdominal muscle weakness and feels that some of his lower abdominal pain may be due to the stretching of his muscle fibers and scar tissues.   Patient denies any modifying or aggravating factors.  Patient denies any gross hematuria.  Patient denies any fevers, chills or vomiting.  He does have mild nausea intermittently.    His UA from the conduit was not consistent with an UTI.    PMH: Past Medical History:  Diagnosis Date  . Cancer ([facility]) 2015   T2 uroepithelial bladder carcinoma   . Depression   . GERD (gastroesophageal reflux disease)   . Heart murmur   . History of atrial fibrillation without current medication    POST OP MITRIAL VALVE REPLACEMENT IN 2004  . 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 Hyperlipidemia   . Hypertension     Surgical  History: Past Surgical History:  Procedure Laterality Date  . CARDIAC CATHETERIZATION    . CATARACT EXTRACTION W/PHACO Left 11/20/2015   Procedure: CATARACT EXTRACTION PHACO AND INTRAOCULAR LENS PLACEMENT (IOC);  Surgeon: Birder Robson, MD;  Location: [facility] ORS;  Service: Ophthalmology;  Laterality: Left;  [place] AP% CDE fluid pack lot # [id number]  . CHOLECYSTECTOMY  2011  . COLONOSCOPY  2009, 2013   Dr Vassie Moment ADENOMA   . COLONOSCOPY WITH PROPOFOL N/A 07/24/2016   Procedure: COLONOSCOPY WITH PROPOFOL;  Surgeon: Robert Bellow, MD;  Location: [facility] ENDOSCOPY;  Service: Endoscopy;  Laterality: N/A;  . cystectomy  2014   ileal conduit diversion   . HERNIA REPAIR  9562   umbilical   . TONSILLECTOMY  AS CHILD  . TRANSTHORACIC ECHOCARDIOGRAM  11-03-2008  DR FATH [name])   LVF NORMAL/ EF 55-60%/ LEFT ATRIAL ENLARGEMENT/  ADEQUATELY FUNCTIONING MITRAL VALVE REPAIR/  MILD MR & TRI/  NO SIGNIFICANT CHANGE FROM PREVIOUS ECHO  . TRANSURETHRAL RESECTION OF BLADDER TUMOR N/A 07/27/2012   Procedure: TRANSURETHRAL RESECTION OF BLADDER TUMOR (TURBT);  Surgeon: Franchot Gallo, MD;  Location: [facility];  Service: Urology;  Laterality: N/A;  WITH MITOMYCIN INSTILLATION   . urethroptomy  11/05/2016   [place]  . valvuloplasty with replacement of cosgrove ring,1 congenital ruptur of cordi  2004   [facility]   mitral valve     Home Medications:  Allergies as of 12/16/2019  Reactions   Codeine Nausea And Vomiting, Hives      Medication List       Accurate as of December 16, 2019  9:52 PM. If you have any questions, ask your nurse or doctor.        carvedilol 10 MG 24 hr capsule Commonly known as: COREG CR Take 10 mg by mouth every evening.   Clindamycin Phosphate foam APPLY 1 GRAM ON SKIN DAILY   desonide 0.05 % lotion Commonly known as: DESOWEN   Dexilant 60 MG capsule Generic drug: dexlansoprazole Take 1 capsule (60 mg total) by mouth daily.     dicyclomine 10 MG capsule Commonly known as: BENTYL Take 1 capsule (10 mg total) by mouth 2 (two) times daily.   fluconazole 100 MG tablet Commonly known as: DIFLUCAN Take 1 tablet (100 mg total) by mouth daily. X 7 days   gabapentin 300 MG capsule Commonly known as: NEURONTIN TAKE 1 CAPSULE 2 TIMES DAILY   hydrocortisone-pramoxine 2.5-1 % rectal cream Commonly known as: ANALPRAM-HC APPLY A SMALL AMOUNT TWICE  A DAY AS NEEDED   meloxicam 15 MG tablet Commonly known as: MOBIC TAKE ONE TABLET BY MOUTH EVERY DAY   metroNIDAZOLE 1 % gel Commonly known as: METROGEL Apply topically daily.   mirtazapine 15 MG disintegrating tablet Commonly known as: REMERON SOL-TAB Take 1 tablet (15 mg total) by mouth at bedtime.   mometasone 0.1 % cream Commonly known as: ELOCON APPLY TO AFFECTED AREA TWICE A DAY AS NEEDED FOR [place]   mupirocin ointment 2 % Commonly known as: BACTROBAN APPLY A SMALL AMOUNT TO AFFECTED AREA TWICE DAILY   nebivolol 5 MG tablet Commonly known as: BYSTOLIC Take 1 tablet (5 mg total) by mouth daily.   nystatin powder Commonly known as: MYCOSTATIN/NYSTOP Apply topically 4 (four) times daily.   nystatin cream Commonly known as: MYCOSTATIN Apply 1 application topically 2 (two) times daily.   polyethylene glycol 17 g packet Commonly known as: MIRALAX / GLYCOLAX Take 17 g by mouth as needed.   PROBIOTIC & ACIDOPHILUS EX ST PO Take 2 tablets by mouth at bedtime as needed.   traMADol 50 MG tablet Commonly known as: ULTRAM Take 1 tablet (50 mg total) by mouth every 6 (six) hours as needed.   triamcinolone cream 0.1 % Commonly known as: KENALOG       Allergies:  Allergies  Allergen Reactions  . Codeine Nausea And Vomiting and Hives    Family History: Family History  Adopted: Yes  Problem Relation Age of Onset  . Colon cancer Mother   . Cancer Neg Hx        Prostate,Kidney,Bladder  . Prostate cancer Neg Hx   . Bladder Cancer Neg Hx   .  Kidney cancer Neg Hx     Social History:  reports that he quit smoking about 7 years ago. He has never used smokeless tobacco. He reports current alcohol use of about 1.0 standard drink of alcohol per week. He reports that he does not use drugs.  ROS: For pertinent review of systems please refer to history of present illness  Physical Exam: BP 110/70   Pulse (!) 49   Ht 5\' 5"  (1.651 m)   Wt 143 lb (64.9 kg)   BMI 23.80 kg/m   Constitutional: Well nourished. Alert and oriented, No acute distress. HEENT: [place] AT, mask in place. Trachea midline, no masses. Cardiovascular: No clubbing, cyanosis, or edema. Respiratory: Normal respiratory effort, no increased work of breathing. GI: Stoma  located in the LLQ, stoma pink and viable.  Neurologic: Grossly intact, no focal deficits, moving all 4 extremities. Psychiatric: Normal mood and affect.  Laboratory Data: Urinalysis Component     Latest Ref Rng & Units 12/16/2019  Specific Gravity, UA     1.005 - 1.030 1.010  pH, UA     5.0 - 7.5 6.5  Color, UA     Yellow Yellow  Appearance Ur     Clear Clear  Leukocytes,UA     Negative Trace (A)  Protein,UA     Negative/Trace Negative  Glucose, UA     Negative Negative  Ketones, UA     Negative Negative  RBC, UA     Negative Negative  Bilirubin, UA     Negative Negative  Urobilinogen, Ur     0.2 - 1.0 mg/dL 0.2  Nitrite, UA     Negative Negative  Microscopic Examination      See below:   Component     Latest Ref Rng & Units 12/16/2019  WBC, UA     0 - 5 /hpf 0-5  RBC     0 - 2 /hpf 0-2  Epithelial Cells (non renal)     0 - 10 /hpf 0-10  Bacteria, UA     None seen/Few Moderate (A)   Results for orders placed or performed in visit on 09/08/19  Comprehensive metabolic panel  Result Value Ref Range   Sodium 139 135 - 145 mEq/L   Potassium 4.1 3.5 - 5.1 mEq/L   Chloride 103 96 - 112 mEq/L   CO2 30 19 - 32 mEq/L   Glucose, Bld 91 70 - 99 mg/dL   BUN 13 6 - 23 mg/dL    Creatinine, Ser 0.80 0.40 - 1.50 mg/dL   Total Bilirubin 0.3 0.2 - 1.2 mg/dL   Alkaline Phosphatase 68 39 - 117 U/L   AST 17 0 - 37 U/L   ALT 19 0 - 53 U/L   Total Protein 6.6 6.0 - 8.3 g/dL   Albumin 4.5 3.5 - 5.2 g/dL   GFR 95.77 >60.00 mL/min   Calcium 9.2 8.4 - 10.5 mg/dL  Lipid panel  Result Value Ref Range   Cholesterol 161 0 - 200 mg/dL   Triglycerides 166.0 (H) 0 - 149 mg/dL   HDL 44.80 >39.00 mg/dL   VLDL 33.2 0.0 - 40.0 mg/dL   LDL Cholesterol 83 0 - 99 mg/dL   Total CHOL/HDL Ratio 4    NonHDL 116.47    I have reviewed the labs  Assessment & Plan:   1. Bladder cancer:  Patient is status post robotic assisted cystectomy/prostatectomy with ileo conduit diversion performed by Dr. Ardath Sax the on 01/28/2013.  And now s/p urethrectomy by Dr. Darcel Bayley.  He is being followed at [facility] in [place].   He has recently returned from [place].  No signs of recurrence at last visit.    2. Prostate cancer:   Patient is status post robotic assisted cystectomy/prostatectomy with ileo conduit diversion performed by Dr. Ardath Sax the on 01/28/2013.  He is being followed at [facility] in [place].    3. Lower abdominal pain Likely due to stomal hernia UA is sent for culture, but will not prescribe an antibiotic unless he develops symptoms of an UTI  Return if symptoms worsen or fail to improve.  Zara Council, PA-C  [facility] [address] [place], [place] [address] [phone]

## 2019-12-16 ENCOUNTER — Ambulatory Visit (INDEPENDENT_AMBULATORY_CARE_PROVIDER_SITE_OTHER): Payer: Medicare PPO | Admitting: Urology

## 2019-12-16 ENCOUNTER — Encounter: Payer: Self-pay | Admitting: Urology

## 2019-12-16 ENCOUNTER — Other Ambulatory Visit: Payer: Self-pay

## 2019-12-16 ENCOUNTER — Other Ambulatory Visit: Payer: Self-pay | Admitting: Urology

## 2019-12-16 VITALS — BP 110/70 | HR 49 | Ht 65.0 in | Wt 143.0 lb

## 2019-12-16 DIAGNOSIS — N390 Urinary tract infection, site not specified: Secondary | ICD-10-CM | POA: Diagnosis not present

## 2019-12-16 DIAGNOSIS — R1030 Lower abdominal pain, unspecified: Secondary | ICD-10-CM | POA: Diagnosis not present

## 2019-12-17 LAB — URINALYSIS, COMPLETE
Bilirubin, UA: NEGATIVE
Glucose, UA: NEGATIVE
Ketones, UA: NEGATIVE
Nitrite, UA: NEGATIVE
Protein,UA: NEGATIVE
RBC, UA: NEGATIVE
Specific Gravity, UA: 1.01 (ref 1.005–1.030)
Urobilinogen, Ur: 0.2 mg/dL (ref 0.2–1.0)
pH, UA: 6.5 (ref 5.0–7.5)

## 2019-12-17 LAB — MICROSCOPIC EXAMINATION

## 2019-12-20 DIAGNOSIS — Z936 Other artificial openings of urinary tract status: Secondary | ICD-10-CM | POA: Diagnosis not present

## 2019-12-21 ENCOUNTER — Encounter: Payer: Self-pay | Admitting: Internal Medicine

## 2019-12-21 ENCOUNTER — Other Ambulatory Visit: Payer: Self-pay

## 2019-12-21 ENCOUNTER — Ambulatory Visit: Payer: Medicare PPO | Admitting: Internal Medicine

## 2019-12-21 VITALS — BP 122/72 | HR 66 | Temp 98.1°F | Resp 15 | Ht 65.0 in | Wt 145.4 lb

## 2019-12-21 DIAGNOSIS — M4150 Other secondary scoliosis, site unspecified: Secondary | ICD-10-CM

## 2019-12-21 DIAGNOSIS — I10 Essential (primary) hypertension: Secondary | ICD-10-CM

## 2019-12-21 DIAGNOSIS — M4180 Other forms of scoliosis, site unspecified: Secondary | ICD-10-CM | POA: Diagnosis not present

## 2019-12-21 DIAGNOSIS — F320 Major depressive disorder, single episode, mild: Secondary | ICD-10-CM | POA: Diagnosis not present

## 2019-12-21 DIAGNOSIS — Z936 Other artificial openings of urinary tract status: Secondary | ICD-10-CM | POA: Diagnosis not present

## 2019-12-21 DIAGNOSIS — Z8551 Personal history of malignant neoplasm of bladder: Secondary | ICD-10-CM | POA: Diagnosis not present

## 2019-12-21 LAB — CULTURE, URINE COMPREHENSIVE

## 2019-12-21 NOTE — Assessment & Plan Note (Signed)
Improved with Bystolic.  Does not feel symptomatic

## 2019-12-21 NOTE — Assessment & Plan Note (Addendum)
Aggravated by loss of muscle tone in core due to history of bladder cancer resulting in a permament cystostomy and large ventral hernia.  Plain thoracolumbar spine films ordered and referral for PT for core strengthening

## 2019-12-21 NOTE — Assessment & Plan Note (Signed)
Secondary to unresolved grief from loss of mother compounded by dissatisfaction with his current professional situation.  Improving, He defers medication at this time.

## 2019-12-21 NOTE — Progress Notes (Signed)
Subjective:  Patient ID: Tony Cox, male    DOB: 01-Aug-1950  Age: 69 y.o. 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 Diagnoses of Essential hypertension, Depression, major, single episode, mild ([place]), and Personal history of bladder cancer were also pertinent to this visit.  HPI Tony Cox presents for follow up  This visit occurred during the SARS-CoV-2 public health emergency.  Safety protocols were in place, including screening questions prior to the visit, additional usage of staff PPE, and extensive cleaning of exam room while observing appropriate contact time as indicated for disinfecting solutions.   He feels generally well,  And remains recurrence free from bladder cancer at 4 years.  He has had some peristomal pain from his cystostomy which has been evaluated by his local urologist   HTN:  Hypertension: patient checks blood pressure daily  at home.  Readings have been exclusively <120/70 after exercise and at rest  And at times as low as 96/57 with no palpitations,    Patient is following a reduced salt diet most days and is taking medications as prescribed   Feels he is starting to develop scoliosis  From the uneven way his ventral hernia has allowed his abdomen to become misshapen .  discusse getting x rays and PT referral   with [facility]   Outpatient Medications Prior to Visit  Medication Sig Dispense Refill  . Clindamycin Phosphate foam APPLY 1 GRAM ON SKIN DAILY  5  . desonide (DESOWEN) 0.05 % lotion     . dexlansoprazole (DEXILANT) 60 MG capsule Take 1 capsule (60 mg total) by mouth daily. 90 capsule 1  . dicyclomine (BENTYL) 10 MG capsule Take 1 capsule (10 mg total) by mouth 2 (two) times daily. 180 capsule 3  . gabapentin (NEURONTIN) 300 MG capsule TAKE 1 CAPSULE 2 TIMES DAILY 180 capsule 3  . hydrocortisone-pramoxine (ANALPRAM-HC) 2.5-1 % rectal cream APPLY A SMALL AMOUNT TWICE   A DAY AS NEEDED 30 g 0  . meloxicam (MOBIC) 15 MG tablet TAKE ONE TABLET BY MOUTH EVERY DAY 30 tablet 1  . metroNIDAZOLE (METROGEL) 1 % gel Apply topically daily. 60 g 5  . mirtazapine (REMERON SOL-TAB) 15 MG disintegrating tablet Take 1 tablet (15 mg total) by mouth at bedtime. 90 tablet 3  . mometasone (ELOCON) 0.1 % cream APPLY TO AFFECTED AREA TWICE A DAY AS NEEDED FOR RUSH 60 g 1  . mupirocin ointment (BACTROBAN) 2 % APPLY A SMALL AMOUNT TO AFFECTED AREA TWICE DAILY 22 g 1  . nebivolol (BYSTOLIC) 5 MG tablet Take 1 tablet (5 mg total) by mouth daily. 30 tablet 0  . nystatin (MYCOSTATIN/NYSTOP) powder Apply topically 4 (four) times daily. 15 g 4  . nystatin cream (MYCOSTATIN) Apply 1 application topically 2 (two) times daily. 30 g 0  . polyethylene glycol (MIRALAX / GLYCOLAX) packet Take 17 g by mouth as needed.     . Probiotic Product (PROBIOTIC & ACIDOPHILUS EX ST PO) Take 2 tablets by mouth at bedtime as needed.    . traMADol (ULTRAM) 50 MG tablet Take 1 tablet (50 mg total) by mouth every 6 (six) hours as needed. 30 tablet 0  . triamcinolone cream (KENALOG) 0.1 %     . carvedilol (COREG CR) 10 MG 24 hr capsule Take 10 mg by mouth every evening. (Patient not taking: Reported on 12/21/2019)    . fluconazole (DIFLUCAN) 100 MG tablet Take 1 tablet (  100 mg total) by mouth daily. X 7 days (Patient not taking: Reported on 09/08/2019) 7 tablet 3   No facility-administered medications prior to visit.    Review of Systems;  Patient denies headache, fevers, malaise, unintentional weight loss, skin rash, eye pain, sinus congestion and sinus pain, sore throat, dysphagia,  hemoptysis , cough, dyspnea, wheezing, chest pain, palpitations, orthopnea, edema, abdominal pain, nausea, melena, diarrhea, constipation, flank pain, dysuria, hematuria, urinary  Frequency, nocturia, numbness, tingling, seizures,  Focal weakness, Loss of consciousness,  Tremor, insomnia, depression, anxiety, and suicidal ideation.       Objective:  BP 122/72 (BP Location: Left Arm, Patient Position: Sitting, Cuff Size: Normal)   Pulse 66   Temp 98.1 F (36.7 C) (Oral)   Resp 15   Ht 5\' 5"  (1.651 m)   Wt 145 lb 6.4 oz (66 kg)   SpO2 96%   BMI 24.20 kg/m   BP Readings from Last 3 Encounters:  12/21/19 122/72  12/16/19 110/70  09/08/19 116/68    Wt Readings from Last 3 Encounters:  12/21/19 145 lb 6.4 oz (66 kg)  12/16/19 143 lb (64.9 kg)  09/08/19 148 lb (67.1 kg)    General appearance: alert, cooperative and appears stated age Ears: normal TM's and external ear canals both ears Throat: lips, mucosa, and tongue normal; teeth and gums normal Neck: no adenopathy, no carotid bruit, supple, symmetrical, trachea midline and thyroid not enlarged, symmetric, no tenderness/mass/nodules Back: symmetric, no curvature. ROM normal. No CVA tenderness. Lungs: clear to auscultation bilaterally Heart: regular rate and rhythm, S1, S2 normal, no murmur, click, rub or gallop Abdomen: soft, non-tender; bowel sounds normal; no masses,  no organomegaly Pulses: 2+ and symmetric Skin: Skin color, texture, turgor normal. No rashes or lesions Lymph nodes: Cervical, supraclavicular, and axillary nodes normal.  No results found for: HGBA1C  Lab Results  Component Value Date   CREATININE 0.80 09/08/2019   CREATININE 0.98 12/02/2016   CREATININE 0.95 10/13/2015    Lab Results  Component Value Date   WBC 4.7 12/02/2016   HGB 13.6 12/02/2016   HCT 39.8 12/02/2016   PLT 205 12/02/2016   GLUCOSE 91 09/08/2019   CHOL 161 09/08/2019   TRIG 166.0 (H) 09/08/2019   HDL 44.80 09/08/2019   LDLDIRECT 135.5 01/16/2010   LDLCALC 83 09/08/2019   ALT 19 09/08/2019   AST 17 09/08/2019   NA 139 09/08/2019   K 4.1 09/08/2019   CL 103 09/08/2019   CREATININE 0.80 09/08/2019   BUN 13 09/08/2019   CO2 30 09/08/2019   TSH 1.20 10/13/2015   PSA 2.1 10/19/2012   MICROALBUR 0.7 07/07/2012    No results found.  Assessment &  Plan:   Problem List Items Addressed This Visit      Unprioritized   Depression, major, single episode, mild ([place])    Secondary to unresolved grief from loss of mother compounded by dissatisfaction with his current professional situation.  Improving, He defers medication at this time.       Hypertension    Improved with Bystolic.  Does not feel symptomatic       Personal history of bladder cancer    He has had no recurrence after 4 years and continues to see his oncologist at [facility] due to degenerative disease of spine in adult patient - Primary    Aggravated by loss of muscle tone in core due to history of bladder cancer resulting in a  permament cystostomy and large ventral hernia.  Plain thoracolumbar spine films ordered and referral for PT for core strengthening       Relevant Orders   Ambulatory referral to Physical Therapy   DG Lumbar Spine Complete   DG Thoracic Spine W/Swimmers     I provided  30 minutes of  face-to-face time during this encounter reviewing patient's current problems and past surgeries, labs and imaging studies, providing counseling on the above mentioned problems , and coordination  of care .  I have discontinued [place] Nurse. Mcartor's carvedilol and fluconazole. I am also having him maintain his desonide, hydrocortisone-pramoxine, Clindamycin Phosphate, polyethylene glycol, Probiotic Product (PROBIOTIC & ACIDOPHILUS EX ST PO), traMADol, nystatin, triamcinolone cream, nystatin cream, meloxicam, Dexilant, dicyclomine, gabapentin, mirtazapine, metroNIDAZOLE, nebivolol, mometasone, and mupirocin ointment.  No orders of the defined types were placed in this encounter.   Medications Discontinued During This Encounter  Medication Reason  . carvedilol (COREG CR) 10 MG 24 hr capsule Discontinued by provider  . fluconazole (DIFLUCAN) 100 MG tablet     Follow-up: No follow-ups on file.   Crecencio Mc, MD

## 2019-12-21 NOTE — Patient Instructions (Signed)
I have scheduled you for x rays of your lumbar spine on September 16 20121 at [facility]  I have also scheduled you for physical therapy on Sept 16 2021 with Pricilla Riffle

## 2019-12-21 NOTE — Assessment & Plan Note (Signed)
He has had no recurrence after 4 years and continues to see his oncologist at [facility]

## 2019-12-28 ENCOUNTER — Encounter: Payer: Self-pay | Admitting: Urology

## 2019-12-28 ENCOUNTER — Other Ambulatory Visit: Payer: Medicare PPO

## 2019-12-28 ENCOUNTER — Telehealth: Payer: Self-pay | Admitting: Urology

## 2019-12-28 ENCOUNTER — Other Ambulatory Visit: Payer: Self-pay

## 2019-12-28 DIAGNOSIS — N390 Urinary tract infection, site not specified: Secondary | ICD-10-CM | POA: Diagnosis not present

## 2019-12-28 NOTE — Telephone Encounter (Signed)
There is no orders in for a UA. Patient walked in to the office today and states he talked to [facility] on her cell phone and he was told to come in to the office to leave a urine sample. [name] is on vacation so I went ahead and put the order in.

## 2019-12-28 NOTE — Telephone Encounter (Signed)
Pt left vm he wants to see if there is an order for his Urinalysis to be done?

## 2019-12-29 ENCOUNTER — Other Ambulatory Visit: Payer: Self-pay | Admitting: Internal Medicine

## 2019-12-29 LAB — MICROSCOPIC EXAMINATION

## 2019-12-29 LAB — URINALYSIS, COMPLETE
Bilirubin, UA: NEGATIVE
Glucose, UA: NEGATIVE
Ketones, UA: NEGATIVE
Leukocytes,UA: NEGATIVE
Nitrite, UA: NEGATIVE
Protein,UA: NEGATIVE
Specific Gravity, UA: <1.005 — ABNORMAL LOW (ref 1.005–1.030)
Urobilinogen, Ur: 0.2 mg/dL (ref 0.2–1.0)
pH, UA: 6 (ref 5.0–7.5)

## 2020-01-01 LAB — CULTURE, URINE COMPREHENSIVE

## 2020-01-03 ENCOUNTER — Encounter: Payer: Self-pay | Admitting: Urology

## 2020-01-03 ENCOUNTER — Other Ambulatory Visit: Payer: Self-pay | Admitting: Urology

## 2020-01-03 ENCOUNTER — Telehealth: Payer: Self-pay | Admitting: Family Medicine

## 2020-01-03 MED ORDER — SULFAMETHOXAZOLE-TRIMETHOPRIM 800-160 MG PO TABS: 1.0000 | ORAL_TABLET | Freq: Two times a day (BID) | ORAL | 0 refills | Status: DC

## 2020-01-03 NOTE — Telephone Encounter (Signed)
Patient notified and voiced understanding.

## 2020-01-03 NOTE — Progress Notes (Signed)
Septra DS sent to [facility].

## 2020-01-03 NOTE — Telephone Encounter (Signed)
-----   Message from Nori Riis, PA-C sent at 01/03/2020  8:21 AM EDT ----- Please let Mr. Klahr know that his urine culture returned positive for infection and he need to stop the cephalexin and start Septra DS BID x 7 days.

## 2020-01-04 ENCOUNTER — Telehealth: Payer: Self-pay | Admitting: Urology

## 2020-01-04 NOTE — Telephone Encounter (Signed)
Patient called stating the Septra causes stomach upset, prescription changed to Cipro.

## 2020-01-29 ENCOUNTER — Other Ambulatory Visit: Payer: Self-pay | Admitting: Internal Medicine

## 2020-02-07 DIAGNOSIS — Z936 Other artificial openings of urinary tract status: Secondary | ICD-10-CM | POA: Diagnosis not present

## 2020-02-10 ENCOUNTER — Other Ambulatory Visit: Payer: Self-pay | Admitting: Family Medicine

## 2020-02-10 ENCOUNTER — Other Ambulatory Visit: Payer: Self-pay | Admitting: Internal Medicine

## 2020-02-10 ENCOUNTER — Other Ambulatory Visit (INDEPENDENT_AMBULATORY_CARE_PROVIDER_SITE_OTHER): Payer: Medicare PPO

## 2020-02-10 ENCOUNTER — Other Ambulatory Visit: Payer: Self-pay

## 2020-02-10 ENCOUNTER — Other Ambulatory Visit: Payer: Medicare PPO

## 2020-02-10 DIAGNOSIS — R5381 Other malaise: Secondary | ICD-10-CM

## 2020-02-10 DIAGNOSIS — N390 Urinary tract infection, site not specified: Secondary | ICD-10-CM

## 2020-02-10 DIAGNOSIS — R5383 Other fatigue: Secondary | ICD-10-CM | POA: Diagnosis not present

## 2020-02-10 LAB — URINALYSIS, ROUTINE W REFLEX MICROSCOPIC
Bilirubin Urine: NEGATIVE
Hgb urine dipstick: NEGATIVE
Ketones, ur: NEGATIVE
Leukocytes,Ua: NEGATIVE
Nitrite: NEGATIVE
Specific Gravity, Urine: 1.01 (ref 1.000–1.030)
Total Protein, Urine: NEGATIVE
Urine Glucose: NEGATIVE
Urobilinogen, UA: 0.2 (ref 0.0–1.0)
pH: 6.5 (ref 5.0–8.0)

## 2020-02-10 LAB — CBC WITH DIFFERENTIAL/PLATELET
Basophils Absolute: 0 10*3/uL (ref 0.0–0.1)
Basophils Relative: 1 % (ref 0.0–3.0)
Eosinophils Absolute: 0.1 10*3/uL (ref 0.0–0.7)
Eosinophils Relative: 2.5 % (ref 0.0–5.0)
HCT: 42.7 % (ref 39.0–52.0)
Hemoglobin: 14.5 g/dL (ref 13.0–17.0)
Lymphocytes Relative: 23.8 % (ref 12.0–46.0)
Lymphs Abs: 1.1 10*3/uL (ref 0.7–4.0)
MCHC: 34 g/dL (ref 30.0–36.0)
MCV: 89 fl (ref 78.0–100.0)
Monocytes Absolute: 0.6 10*3/uL (ref 0.1–1.0)
Monocytes Relative: 13.7 % — ABNORMAL HIGH (ref 3.0–12.0)
Neutro Abs: 2.6 10*3/uL (ref 1.4–7.7)
Neutrophils Relative %: 59 % (ref 43.0–77.0)
Platelets: 191 10*3/uL (ref 150.0–400.0)
RBC: 4.81 Mil/uL (ref 4.22–5.81)
RDW: 12.6 % (ref 11.5–15.5)
WBC: 4.5 10*3/uL (ref 4.0–10.5)

## 2020-02-10 LAB — TSH: TSH: 1.05 u[IU]/mL (ref 0.35–4.50)

## 2020-02-11 ENCOUNTER — Other Ambulatory Visit (INDEPENDENT_AMBULATORY_CARE_PROVIDER_SITE_OTHER): Payer: Medicare PPO

## 2020-02-11 ENCOUNTER — Other Ambulatory Visit: Payer: Self-pay | Admitting: Internal Medicine

## 2020-02-11 DIAGNOSIS — R5381 Other malaise: Secondary | ICD-10-CM

## 2020-02-11 LAB — COMPREHENSIVE METABOLIC PANEL
ALT: 20 U/L (ref 0–53)
AST: 20 U/L (ref 0–37)
Albumin: 4.4 g/dL (ref 3.5–5.2)
Alkaline Phosphatase: 63 U/L (ref 39–117)
BUN: 11 mg/dL (ref 6–23)
CO2: 31 mEq/L (ref 19–32)
Calcium: 9.4 mg/dL (ref 8.4–10.5)
Chloride: 103 mEq/L (ref 96–112)
Creatinine, Ser: 0.87 mg/dL (ref 0.40–1.50)
GFR: 87.92 mL/min (ref >60.00)
Glucose, Bld: 87 mg/dL (ref 70–99)
Potassium: 4.6 mEq/L (ref 3.5–5.1)
Sodium: 139 mEq/L (ref 135–145)
Total Bilirubin: 0.4 mg/dL (ref 0.2–1.2)
Total Protein: 6.4 g/dL (ref 6.0–8.3)

## 2020-02-11 LAB — URINE CULTURE
MICRO NUMBER:: 11101440
Result:: NO GROWTH
SPECIMEN QUALITY:: ADEQUATE

## 2020-02-11 NOTE — Progress Notes (Signed)
SO FAR YOUR LABS ARE NORMAL.  (CBC,  THYROID AND URINALYSIS) THE CMET WAS NOT DRAWN; WE ARE TRYING TO GET THE LAB TO RUN IT FROM THE BLOOD DRAWN.  IF THEY CAN'T,  would you be able to return on Monday to have it drawn?

## 2020-02-11 NOTE — Progress Notes (Signed)
Message #2.  False alarm/Never mind .  The cmet was added and is normal as well

## 2020-02-26 ENCOUNTER — Other Ambulatory Visit: Payer: Self-pay | Admitting: Internal Medicine

## 2020-03-07 DIAGNOSIS — R600 Localized edema: Secondary | ICD-10-CM | POA: Diagnosis not present

## 2020-03-07 DIAGNOSIS — R002 Palpitations: Secondary | ICD-10-CM | POA: Diagnosis not present

## 2020-03-07 DIAGNOSIS — I059 Rheumatic mitral valve disease, unspecified: Secondary | ICD-10-CM | POA: Diagnosis not present

## 2020-03-07 DIAGNOSIS — E782 Mixed hyperlipidemia: Secondary | ICD-10-CM | POA: Diagnosis not present

## 2020-03-20 DIAGNOSIS — C61 Malignant neoplasm of prostate: Secondary | ICD-10-CM | POA: Diagnosis not present

## 2020-03-20 DIAGNOSIS — Z8551 Personal history of malignant neoplasm of bladder: Secondary | ICD-10-CM | POA: Diagnosis not present

## 2020-03-20 DIAGNOSIS — Z8546 Personal history of malignant neoplasm of prostate: Secondary | ICD-10-CM | POA: Diagnosis not present

## 2020-03-20 DIAGNOSIS — C680 Malignant neoplasm of urethra: Secondary | ICD-10-CM | POA: Diagnosis not present

## 2020-03-20 DIAGNOSIS — C679 Malignant neoplasm of bladder, unspecified: Secondary | ICD-10-CM | POA: Diagnosis not present

## 2020-03-20 DIAGNOSIS — D303 Benign neoplasm of bladder: Secondary | ICD-10-CM | POA: Diagnosis not present

## 2020-04-03 ENCOUNTER — Other Ambulatory Visit: Payer: Self-pay

## 2020-04-03 MED ORDER — MUPIROCIN 2 % EX OINT
TOPICAL_OINTMENT | CUTANEOUS | 11 refills | Status: DC
Start: 2020-04-03 — End: 2020-12-29

## 2020-04-15 ENCOUNTER — Encounter: Payer: Self-pay | Admitting: Internal Medicine

## 2020-04-15 DIAGNOSIS — U071 COVID-19: Secondary | ICD-10-CM | POA: Insufficient documentation

## 2020-04-17 ENCOUNTER — Telehealth: Payer: Self-pay

## 2020-04-17 NOTE — Telephone Encounter (Signed)
Pt has been referred and notified.

## 2020-04-17 NOTE — Telephone Encounter (Signed)
Pt said to let Dr. Darrick Huntsman know he hasn't heard from the infusion clinic. I let him know that this is usually takes a few days.

## 2020-05-04 ENCOUNTER — Ambulatory Visit (INDEPENDENT_AMBULATORY_CARE_PROVIDER_SITE_OTHER): Payer: Medicare PPO | Admitting: Urology

## 2020-05-04 ENCOUNTER — Other Ambulatory Visit: Payer: Self-pay

## 2020-05-04 ENCOUNTER — Encounter: Payer: Self-pay | Admitting: Urology

## 2020-05-04 VITALS — BP 116/65 | HR 56 | Ht 65.0 in | Wt 145.0 lb

## 2020-05-04 DIAGNOSIS — N432 Other hydrocele: Secondary | ICD-10-CM | POA: Diagnosis not present

## 2020-05-04 NOTE — Progress Notes (Signed)
05/04/2020 8:45 PM   Tony Cox [birth year] [id number]  Referring provider: Crecencio Mc, MD [place],  [place] [address]  Chief Complaint  Patient presents with  . Follow-up   Urological history 1. 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 Ardath Sax the on 01/28/2013 for muscle invasive bladder cancer status post neoadjuvant it chemotherapy - Patient underwent urethrectomy with Dr. Darcel Bayley on 11/05/2016.  Pathology was benign. - Currently being followed and managed by Dr Lovette Cliche at [facility] in [place]   2. 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 male who presents today for testicular tenderness.   He has been experiencing testicular tenderness and swelling for the last week.  He has not had any trauma to the scrotum.  Patient denies any modifying or aggravating factors.  Patient denies any gross hematuria or suprapubic/flank pain.  Patient denies any fevers, chills, nausea or vomiting.   PMH: Past Medical History:  Diagnosis Date  . Cancer ([place]) 2015   T2 uroepithelial bladder carcinoma   . Depression   . GERD (gastroesophageal reflux disease)   . Heart murmur   . History of atrial fibrillation without current medication    POST OP MITRIAL VALVE REPLACEMENT IN 2004  . 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 Hyperlipidemia   . Hypertension     Surgical History: Past Surgical History:  Procedure Laterality Date  . CARDIAC CATHETERIZATION    . CATARACT EXTRACTION W/PHACO Left 11/20/2015   Procedure: CATARACT EXTRACTION PHACO AND INTRAOCULAR LENS PLACEMENT (IOC);   Surgeon: Birder Robson, MD;  Location: [facility] ORS;  Service: Ophthalmology;  Laterality: Left;  [place] AP% CDE fluid pack lot # [id number]  . CHOLECYSTECTOMY  2011  . COLONOSCOPY  2009, 2013   Dr Vassie Moment ADENOMA   . COLONOSCOPY WITH PROPOFOL N/A 07/24/2016   Procedure: COLONOSCOPY WITH PROPOFOL;  Surgeon: Robert Bellow, MD;  Location: [facility] ENDOSCOPY;  Service: Endoscopy;  Laterality: N/A;  . cystectomy  2014   ileal conduit diversion   . HERNIA REPAIR  5956   umbilical   . TONSILLECTOMY  AS CHILD  . TRANSTHORACIC ECHOCARDIOGRAM  11-03-2008  DR FATH [name])   LVF NORMAL/ EF 55-60%/ LEFT ATRIAL ENLARGEMENT/  ADEQUATELY FUNCTIONING MITRAL VALVE REPAIR/  MILD MR & TRI/  NO SIGNIFICANT CHANGE FROM PREVIOUS ECHO  . TRANSURETHRAL RESECTION OF BLADDER TUMOR N/A 07/27/2012   Procedure: TRANSURETHRAL RESECTION OF BLADDER TUMOR (TURBT);  Surgeon: Franchot Gallo, MD;  Location: [facility];  Service: Urology;  Laterality: N/A;  WITH MITOMYCIN INSTILLATION   . urethroptomy  11/05/2016   [place]  . valvuloplasty with replacement of cosgrove ring,1 congenital ruptur of cordi  2004   [facility]   mitral valve     Home Medications:  Allergies as of 05/04/2020      Reactions   Codeine Nausea And Vomiting, Hives      Medication List       Accurate as of May 04, 2020 11:59 PM. If you have any questions, ask your nurse or doctor.        STOP taking  these medications   ciprofloxacin 500 MG tablet Commonly known as: CIPRO Stopped by: Lexii Walsh, PA-C     TAKE these medications   albuterol 108 (90 Base) MCG/ACT inhaler Commonly known as: VENTOLIN HFA SMARTSIG:2 Puff(s) Via Inhaler Every 8 Hours PRN   Bystolic 5 MG tablet Generic drug: nebivolol TAKE ONE TABLET EVERY DAY   Clindamycin Phosphate foam APPLY 1 GRAM ON SKIN DAILY   desonide 0.05 % lotion Commonly known as: DESOWEN   Dexilant 60 MG capsule Generic drug: dexlansoprazole Take 1  capsule (60 mg total) by mouth daily.   dicyclomine 10 MG capsule Commonly known as: BENTYL Take 1 capsule (10 mg total) by mouth 2 (two) times daily.   gabapentin 300 MG capsule Commonly known as: NEURONTIN TAKE 1 CAPSULE 2 TIMES DAILY   hydrocortisone-pramoxine 2.5-1 % rectal cream Commonly known as: ANALPRAM-HC APPLY A SMALL AMOUNT TWICE  A DAY AS NEEDED   meloxicam 15 MG tablet Commonly known as: MOBIC TAKE ONE TABLET BY MOUTH EVERY DAY   metroNIDAZOLE 1 % gel Commonly known as: METROGEL Apply topically daily.   mirtazapine 15 MG disintegrating tablet Commonly known as: REMERON SOL-TAB Take 1 tablet (15 mg total) by mouth at bedtime.   mometasone 0.1 % cream Commonly known as: ELOCON APPLY TO AFFECTED AREA TWICE A DAY AS NEEDED FOR [place]USH   mupirocin ointment 2 % Commonly known as: BACTROBAN APPLY A SMALL AMOUNT TO AFFECTED AREA TWICE DAILY   nystatin powder Commonly known as: MYCOSTATIN/NYSTOP Apply topically 4 (four) times daily.   nystatin cream Commonly known as: MYCOSTATIN Apply 1 application topically 2 (two) times daily.   polyethylene glycol 17 g packet Commonly known as: MIRALAX / GLYCOLAX Take 17 g by mouth as needed.   PROBIOTIC & ACIDOPHILUS EX ST PO Take 2 tablets by mouth at bedtime as needed.   sulfamethoxazole-trimethoprim 800-160 MG tablet Commonly known as: BACTRIM DS Take 1 tablet by mouth every 12 (twelve) hours.   traMADol 50 MG tablet Commonly known as: ULTRAM Take 1 tablet (50 mg total) by mouth every 6 (six) hours as needed.   triamcinolone 0.1 % Commonly known as: KENALOG       Allergies:  Allergies  Allergen Reactions  . Codeine Nausea And Vomiting and Hives    Family History: Family History  Adopted: Yes  Problem Relation Age of Onset  . Colon cancer Mother   . Cancer Neg Hx        Prostate,Kidney,Bladder  . Prostate cancer Neg Hx   . Bladder Cancer Neg Hx   . Kidney cancer Neg Hx     Social History:   reports that he quit smoking about 8 years ago. He has never used smokeless tobacco. He reports current alcohol use of about 1.0 standard drink of alcohol per week. He reports that he does not use drugs.  ROS: Pertinent ROS in HPI  Physical Exam: BP 116/65   Pulse (!) 56   Ht 5\' 5"  (1.651 m)   Wt 145 lb (65.8 kg)   BMI 24.13 kg/m   Constitutional:  Well nourished. Alert and oriented, No acute distress. HEENT: [place] AT, mask in place.  Trachea midline Cardiovascular: No clubbing, cyanosis, or edema. Respiratory: Normal respiratory effort, no increased work of breathing. GU: No CVA tenderness.  Stoma is pink and healthy.  Patient with uncircumcised phallus.  Foreskin easily retracted  Urethral meatus is patent.  No penile discharge. No penile lesions or rashes. Scrotum without lesions, cysts, rashes and/or  edema.  Testicles are located scrotally bilaterally. No masses are appreciated in the testicles. Left epididymis is normal.  Right epididymis is indurated.  Right scrotal fullness likely due to hydrocele.  Lymph: No inguinal adenopathy. Neurologic: Grossly intact, no focal deficits, moving all 4 extremities. Psychiatric: Normal mood and affect.  Laboratory Data: Lab Results  Component Value Date   WBC 4.5 02/10/2020   HGB 14.5 02/10/2020   HCT 42.7 02/10/2020   MCV 89.0 02/10/2020   PLT 191.0 02/10/2020    Lab Results  Component Value Date   CREATININE 0.87 02/11/2020    Lab Results  Component Value Date   PSA 2.1 10/19/2012   PSA 3.16 05/31/2011   PSA 1.80 10/09/2009     Lab Results  Component Value Date   TSH 1.05 02/10/2020       Component Value Date/Time   CHOL 161 09/08/2019 1627   HDL 44.80 09/08/2019 1627   CHOLHDL 4 09/08/2019 1627   VLDL 33.2 09/08/2019 1627   LDLCALC 83 09/08/2019 1627    Lab Results  Component Value Date   AST 20 02/11/2020   Lab Results  Component Value Date   ALT 20 02/11/2020    Urinalysis    Component Value Date/Time    COLORURINE YELLOW 02/10/2020 [place] 02/10/2020 0941   APPEARANCEUR Clear 12/28/2019 1439   LABSPEC 1.010 02/10/2020 0941   LABSPEC 1.011 11/30/2012 1427   PHURINE 6.5 02/10/2020 0941   GLUCOSEU NEGATIVE 02/10/2020 0941   HGBUR NEGATIVE 02/10/2020 0941   HGBUR negative 10/09/2009 [place] 02/10/2020 0941   BILIRUBINUR Negative 12/28/2019 1439   BILIRUBINUR see comment 11/30/2012 [place] 02/10/2020 0941   PROTEINUR Negative 12/28/2019 1439   PROTEINUR see comment 11/30/2012 1427   UROBILINOGEN 0.2 02/10/2020 0941   NITRITE NEGATIVE 02/10/2020 0941   LEUKOCYTESUR NEGATIVE 02/10/2020 0941   LEUKOCYTESUR see comment 11/30/2012 1427    I have reviewed the labs.   Pertinent Imaging: No recent imaging  Assessment & Plan:    1. Other hydrocele - US SCROTUM W/DOPPLER; Future to evaluate internal contents - will contact patient with results   Return for I will call patient with results.  These notes generated with voice recognition software. I apologize for typographical errors.  Zara Council, PA-C  [facility] [address]  [place], [place] [address] [phone]

## 2020-05-07 ENCOUNTER — Other Ambulatory Visit: Payer: Self-pay | Admitting: Internal Medicine

## 2020-05-07 DIAGNOSIS — R509 Fever, unspecified: Secondary | ICD-10-CM | POA: Insufficient documentation

## 2020-05-07 MED ORDER — LEVOFLOXACIN 500 MG PO TABS: 500.0000 mg | ORAL_TABLET | Freq: Every day | ORAL | 0 refills | Status: DC

## 2020-05-15 ENCOUNTER — Ambulatory Visit
Admission: RE | Admit: 2020-05-15 | Discharge: 2020-05-15 | Disposition: A | Discharge status: Home / Self Care | Payer: Medicare PPO | Source: Ambulatory Visit | Attending: Urology | Admitting: Urology

## 2020-05-15 ENCOUNTER — Other Ambulatory Visit: Payer: Self-pay

## 2020-05-15 DIAGNOSIS — N503 Cyst of epididymis: Secondary | ICD-10-CM | POA: Diagnosis not present

## 2020-05-15 DIAGNOSIS — N432 Other hydrocele: Secondary | ICD-10-CM | POA: Diagnosis not present

## 2020-05-15 DIAGNOSIS — N433 Hydrocele, unspecified: Secondary | ICD-10-CM | POA: Diagnosis not present

## 2020-05-18 ENCOUNTER — Other Ambulatory Visit: Payer: Self-pay | Admitting: Podiatry

## 2020-05-18 ENCOUNTER — Other Ambulatory Visit: Payer: Self-pay | Admitting: Internal Medicine

## 2020-05-18 ENCOUNTER — Other Ambulatory Visit: Payer: Self-pay | Admitting: *Deleted

## 2020-05-18 MED ORDER — MELOXICAM 15 MG PO TABS: 15.0000 mg | ORAL_TABLET | Freq: Every day | ORAL | 1 refills | Status: DC

## 2020-05-18 NOTE — Telephone Encounter (Signed)
RX Refill:mobic Last Seen:12-21-19 Last ordered:04-12-19

## 2020-05-19 ENCOUNTER — Other Ambulatory Visit: Payer: Self-pay | Admitting: Podiatry

## 2020-05-27 ENCOUNTER — Other Ambulatory Visit: Payer: Self-pay | Admitting: Internal Medicine

## 2020-05-29 DIAGNOSIS — R002 Palpitations: Secondary | ICD-10-CM | POA: Diagnosis not present

## 2020-05-29 DIAGNOSIS — E782 Mixed hyperlipidemia: Secondary | ICD-10-CM | POA: Diagnosis not present

## 2020-05-29 DIAGNOSIS — R5383 Other fatigue: Secondary | ICD-10-CM | POA: Diagnosis not present

## 2020-05-29 DIAGNOSIS — R600 Localized edema: Secondary | ICD-10-CM | POA: Diagnosis not present

## 2020-05-29 DIAGNOSIS — R0602 Shortness of breath: Secondary | ICD-10-CM | POA: Diagnosis not present

## 2020-05-29 DIAGNOSIS — R059 Cough, unspecified: Secondary | ICD-10-CM | POA: Diagnosis not present

## 2020-05-29 DIAGNOSIS — J310 Chronic rhinitis: Secondary | ICD-10-CM | POA: Diagnosis not present

## 2020-05-29 DIAGNOSIS — I059 Rheumatic mitral valve disease, unspecified: Secondary | ICD-10-CM | POA: Diagnosis not present

## 2020-05-29 DIAGNOSIS — R058 Other specified cough: Secondary | ICD-10-CM | POA: Diagnosis not present

## 2020-05-29 DIAGNOSIS — H6123 Impacted cerumen, bilateral: Secondary | ICD-10-CM | POA: Diagnosis not present

## 2020-05-30 DIAGNOSIS — R002 Palpitations: Secondary | ICD-10-CM | POA: Diagnosis not present

## 2020-05-30 DIAGNOSIS — I059 Rheumatic mitral valve disease, unspecified: Secondary | ICD-10-CM | POA: Diagnosis not present

## 2020-06-10 ENCOUNTER — Other Ambulatory Visit: Payer: Self-pay | Admitting: Internal Medicine

## 2020-06-19 DIAGNOSIS — R002 Palpitations: Secondary | ICD-10-CM | POA: Diagnosis not present

## 2020-06-26 DIAGNOSIS — I10 Essential (primary) hypertension: Secondary | ICD-10-CM | POA: Diagnosis not present

## 2020-06-26 DIAGNOSIS — I059 Rheumatic mitral valve disease, unspecified: Secondary | ICD-10-CM | POA: Diagnosis not present

## 2020-06-26 DIAGNOSIS — C679 Malignant neoplasm of bladder, unspecified: Secondary | ICD-10-CM | POA: Diagnosis not present

## 2020-06-26 DIAGNOSIS — R002 Palpitations: Secondary | ICD-10-CM | POA: Diagnosis not present

## 2020-06-26 DIAGNOSIS — Z9889 Other specified postprocedural states: Secondary | ICD-10-CM | POA: Diagnosis not present

## 2020-06-26 LAB — TESTOSTERONE: Testosterone: 491

## 2020-06-27 ENCOUNTER — Telehealth: Payer: Self-pay | Admitting: Internal Medicine

## 2020-06-27 NOTE — Telephone Encounter (Signed)
Please call patient to schedule appt to discuss testosterone level drawn by dr Ubaldo Glassing

## 2020-06-29 NOTE — Telephone Encounter (Signed)
LMTCB. Need to scheduled pt for a follow up appt with Dr. Derrel Nip.

## 2020-06-29 NOTE — Telephone Encounter (Signed)
Patient returned office phone call and scheduled appointment with Dr. Derrel Nip.

## 2020-06-29 NOTE — Telephone Encounter (Signed)
noted 

## 2020-07-06 DIAGNOSIS — Z936 Other artificial openings of urinary tract status: Secondary | ICD-10-CM | POA: Diagnosis not present

## 2020-07-08 DIAGNOSIS — Z936 Other artificial openings of urinary tract status: Secondary | ICD-10-CM | POA: Diagnosis not present

## 2020-07-10 ENCOUNTER — Ambulatory Visit: Payer: Medicare PPO | Admitting: Internal Medicine

## 2020-07-10 DIAGNOSIS — Z0289 Encounter for other administrative examinations: Secondary | ICD-10-CM

## 2020-07-10 NOTE — Telephone Encounter (Signed)
Pt called he missed his appt this morning and wanted to be worked in. I told pt that I would send a message back and he said that he would message Dr.Tullo personally to be worked in today

## 2020-07-10 NOTE — Telephone Encounter (Signed)
Per Dr. Derrel Nip pt has been scheduled for tomorrow at 12:30pm.

## 2020-07-11 ENCOUNTER — Encounter: Payer: Self-pay | Admitting: Internal Medicine

## 2020-07-11 ENCOUNTER — Telehealth (INDEPENDENT_AMBULATORY_CARE_PROVIDER_SITE_OTHER): Payer: Medicare PPO | Admitting: Internal Medicine

## 2020-07-11 DIAGNOSIS — R7989 Other specified abnormal findings of blood chemistry: Secondary | ICD-10-CM

## 2020-07-11 NOTE — Assessment & Plan Note (Signed)
S/p mitral valve repair with prosthetic ring.  No significant MS or MR by Feb 2022 ECHO.  And EF >55% ([place])

## 2020-07-11 NOTE — Assessment & Plan Note (Addendum)
There has been no significant change since prior testing in 2013.  discussed the risks and benefits of supplementing testosterone in a 70 yr male , which would be largely for QOL.  Marland KitchenHe has had no fractures, but the presence of osteoporosis would provide additional rationale for treatment if subsequent levels  fall .  Recommended no treatment for now given concurrent issues with tolerance of beta blocker therapy Lab Results  Component Value Date   TESTOSTERONE 491 06/26/2020

## 2020-07-11 NOTE — Progress Notes (Addendum)
Virtual Visit via [place]  This visit type was conducted due to national recommendations for restrictions regarding the COVID-19 pandemic (e.g. social distancing).  This format is felt to be most appropriate for this patient at this time.  All issues noted in this document were discussed and addressed.  No physical exam was performed (except for noted visual exam findings with Video Visits).   I connected with@ on 07/11/20 at 12:30 PM EDT by a video enabled telemedicine application  and verified that I am speaking with the correct person using two identifiers. Location patient: home Location provider: work or home office Persons participating in the virtual visit: patient, provider  I discussed the limitations, risks, security and privacy concerns of performing an evaluation and management service by telephone and the availability of in person appointments. I also discussed with the patient that there may be a patient responsible charge related to this service. The patient expressed understanding and agreed to proceed.   Reason for visit: fatigue, low testosterone level   HPI:  70 yr old male with history of urethral and bladder cancer s/p radical prostatectomy and cystectomy  Valvular heart disease and recurrent PVCs presents to discuss appropriateness of treatment for low testosterone. Level was recently checked by his cardiologist during routine follow up during which he reported increased fatigue by end of day. Patient's total testosterone level was normal at 491 but his free testosterone level was slightly low at 6.2  He endorses no other symptoms other than fatigue.  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  The job search and move has been stressful,  And his current works schedule is exhausting.   He has demonstrated increased variability in rate and BP on Systolic but has  been unable to switch to Creg CR due to insurance restrictions.   Depression screen was done and positive but mostly situational and his home life is supportive and happy .  He is not suicidal/   ROS: See pertinent positives and negatives per HPI.  Past Medical History:  Diagnosis Date  . Cancer ([place]) 2015   T2 uroepithelial bladder carcinoma   . Depression   . GERD (gastroesophageal reflux disease)   . Heart murmur   . History of atrial fibrillation without current medication    POST OP MITRIAL VALVE REPLACEMENT IN 2004  . 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 Hyperlipidemia   . Hypertension     Past Surgical History:  Procedure Laterality Date  . CARDIAC CATHETERIZATION    . CATARACT EXTRACTION W/PHACO Left 11/20/2015   Procedure: CATARACT EXTRACTION PHACO AND INTRAOCULAR LENS PLACEMENT (IOC);  Surgeon: Birder Robson, MD;  Location: [facility] ORS;  Service: Ophthalmology;  Laterality: Left;  [place] AP% CDE fluid pack lot # [id number]  . CHOLECYSTECTOMY  2011  . COLONOSCOPY  2009, 2013   Dr Vassie Moment ADENOMA   . COLONOSCOPY WITH PROPOFOL N/A 07/24/2016   Procedure: COLONOSCOPY WITH PROPOFOL;  Surgeon: Robert Bellow, MD;  Location: [facility] ENDOSCOPY;  Service: Endoscopy;  Laterality: N/A;  . cystectomy  2014   ileal conduit diversion   . HERNIA REPAIR  7824   umbilical   . TONSILLECTOMY  AS CHILD  . TRANSTHORACIC ECHOCARDIOGRAM  11-03-2008  DR FATH [name])   LVF NORMAL/  EF 55-60%/ LEFT ATRIAL ENLARGEMENT/  ADEQUATELY FUNCTIONING MITRAL VALVE REPAIR/  MILD MR & TRI/  NO SIGNIFICANT CHANGE FROM PREVIOUS ECHO  . TRANSURETHRAL RESECTION OF BLADDER TUMOR N/A 07/27/2012   Procedure: TRANSURETHRAL RESECTION OF BLADDER TUMOR (TURBT);  Surgeon: Franchot Gallo, MD;  Location: [facility];  Service: Urology;  Laterality: N/A;  WITH MITOMYCIN  INSTILLATION   . urethroptomy  11/05/2016   [place]  . valvuloplasty with replacement of cosgrove ring,1 congenital ruptur of cordi  2004   [facility]   mitral valve     Family History  Adopted: Yes  Problem Relation Age of Onset  . Colon cancer Mother   . Cancer Neg Hx        Prostate,Kidney,Bladder  . Prostate cancer Neg Hx   . Bladder Cancer Neg Hx   . Kidney cancer Neg Hx     SOCIAL HX:  reports that he quit smoking about 8 years ago. He has never used smokeless tobacco. He reports current alcohol use of about 1.0 standard drink of alcohol per week. He reports that he does not use drugs.   Current Outpatient Medications:  .  albuterol (VENTOLIN HFA) 108 (90 Base) MCG/ACT inhaler, SMARTSIG:2 Puff(s) Via Inhaler Every 8 Hours PRN, Disp: , Rfl:  .  BYSTOLIC 5 MG tablet, TAKE ONE TABLET EVERY DAY, Disp: 30 tablet, Rfl: 0 .  Clindamycin Phosphate foam, APPLY 1 GRAM ON SKIN DAILY, Disp: , Rfl: 5 .  desonide (DESOWEN) 0.05 % lotion, , Disp: , Rfl:  .  DEXILANT 60 MG capsule, TAKE 1 CAPSULE BY MOUTH EVERY DAY, Disp: 90 capsule, Rfl: 1 .  dicyclomine (BENTYL) 10 MG capsule, Take 1 capsule (10 mg total) by mouth 2 (two) times daily., Disp: 180 capsule, Rfl: 3 .  doxylamine, Sleep, (UNISOM SLEEPTABS) 25 MG tablet, Take by mouth., Disp: , Rfl:  .  gabapentin (NEURONTIN) 300 MG capsule, TAKE 1 CAPSULE 2 TIMES DAILY, Disp: 180 capsule, Rfl: 3 .  hydrocortisone-pramoxine (ANALPRAM-HC) 2.5-1 % rectal cream, APPLY A SMALL AMOUNT TWICE  A DAY AS NEEDED, Disp: 30 g, Rfl: 0 .  meloxicam (MOBIC) 15 MG tablet, Take 1 tablet (15 mg total) by mouth daily., Disp: 30 tablet, Rfl: 1 .  metroNIDAZOLE (METROGEL) 1 % gel, Apply topically daily., Disp: 60 g, Rfl: 5 .  mirtazapine (REMERON SOL-TAB) 15 MG disintegrating tablet, DISSOLVE 1 TABLET UNDER THE TONGUE EVERY NIGHT AT BEDTIME, Disp: 90 tablet, Rfl: 3 .  mometasone (ELOCON) 0.1 % cream, APPLY TO AFFECTED AREA TWICE A DAY AS NEEDED FOR [place],  Disp: 60 g, Rfl: 1 .  mupirocin ointment (BACTROBAN) 2 %, APPLY A SMALL AMOUNT TO AFFECTED AREA TWICE DAILY, Disp: 22 g, Rfl: 11 .  nystatin (MYCOSTATIN/NYSTOP) powder, Apply topically 4 (four) times daily., Disp: 15 g, Rfl: 4 .  polyethylene glycol (MIRALAX / GLYCOLAX) packet, Take 17 g by mouth as needed. , Disp: , Rfl:  .  Probiotic Product (PROBIOTIC & ACIDOPHILUS EX ST PO), Take 2 tablets by mouth at bedtime as needed., Disp: , Rfl:  .  traMADol (ULTRAM) 50 MG tablet, Take 1 tablet (50 mg total) by mouth every 6 (six) hours as needed., Disp: 30 tablet, Rfl: 0 .  triamcinolone cream (KENALOG) 0.1 %, , Disp: , Rfl:   EXAM:  VITALS per patient if applicable:  GENERAL: alert, oriented, appears well and in no acute distress  HEENT: atraumatic, conjunttiva clear, no obvious abnormalities on inspection of external nose and ears  NECK: normal movements of the head and neck  LUNGS: on inspection no signs of respiratory distress, breathing rate appears normal, no obvious gross SOB, gasping or wheezing  CV: no obvious cyanosis  MS: moves all visible extremities without noticeable abnormality  PSYCH/NEURO: pleasant and cooperative, no obvious depression or anxiety, speech and thought processing grossly intact  ASSESSMENT AND PLAN:  Discussed the following assessment and plan:  Low testosterone in male  Low testosterone in male There has been no significant change since prior testing in 2013.  discussed the risks and benefits of supplementing testosterone in a 70 yr male , which would be largely for QOL.  Marland KitchenHe has had no fractures, but the presence of osteoporosis would provide additional rationale for treatment if subsequent levels  fall .  Recommended no treatment for now given concurrent issues with tolerance of beta blocker therapy Lab Results  Component Value Date   TESTOSTERONE 491 06/26/2020      VALVULAR HEART DISEASE S/p mitral valve repair with prosthetic ring.  No  significant MS or MR by Feb 2022 ECHO.  And EF >55% ([place])    I discussed the assessment and treatment plan with the patient. The patient was provided an opportunity to ask questions and all were answered. The patient agreed with the plan and demonstrated an understanding of the instructions.   The patient was advised to call back or seek an in-person evaluation if the symptoms worsen or if the condition fails to improve as anticipated.   I spent 30 minutes dedicated to the care of this patient on the date of this encounter to include pre-visit review of his medical history,  Face-to-face time with the patient , and post visit ordering of testing and therapeutics.    Crecencio Mc, MD

## 2020-07-17 ENCOUNTER — Telehealth: Payer: Self-pay

## 2020-07-17 ENCOUNTER — Other Ambulatory Visit: Payer: Self-pay | Admitting: Internal Medicine

## 2020-07-17 MED ORDER — NYSTATIN 100000 UNIT/GM EX POWD: Freq: Four times a day (QID) | CUTANEOUS | 4 refills | Status: DC

## 2020-07-17 NOTE — Telephone Encounter (Signed)
[facility] called and requested refill for nystatin (MYCOSTATIN/NYSTOP) powder -States that it was talked about at most recent visit with PCP

## 2020-07-27 ENCOUNTER — Other Ambulatory Visit: Payer: Self-pay | Admitting: Internal Medicine

## 2020-07-27 MED ORDER — MELOXICAM 15 MG PO TABS: 1.0000 | ORAL_TABLET | Freq: Every day | ORAL | 5 refills | Status: DC

## 2020-07-27 NOTE — Telephone Encounter (Signed)
Pt called to follow up on the meloxicam (MOBIC) 15 MG tablet refill  He is leaving to go out of town in the morning

## 2020-07-31 DIAGNOSIS — Z8546 Personal history of malignant neoplasm of prostate: Secondary | ICD-10-CM | POA: Diagnosis not present

## 2020-07-31 DIAGNOSIS — Z8551 Personal history of malignant neoplasm of bladder: Secondary | ICD-10-CM | POA: Diagnosis not present

## 2020-07-31 DIAGNOSIS — C689 Malignant neoplasm of urinary organ, unspecified: Secondary | ICD-10-CM | POA: Diagnosis not present

## 2020-07-31 DIAGNOSIS — C61 Malignant neoplasm of prostate: Secondary | ICD-10-CM | POA: Diagnosis not present

## 2020-07-31 DIAGNOSIS — C679 Malignant neoplasm of bladder, unspecified: Secondary | ICD-10-CM | POA: Diagnosis not present

## 2020-07-31 DIAGNOSIS — D303 Benign neoplasm of bladder: Secondary | ICD-10-CM | POA: Diagnosis not present

## 2020-07-31 DIAGNOSIS — C680 Malignant neoplasm of urethra: Secondary | ICD-10-CM | POA: Diagnosis not present

## 2020-09-19 ENCOUNTER — Other Ambulatory Visit: Payer: Self-pay | Admitting: Internal Medicine

## 2020-09-19 ENCOUNTER — Other Ambulatory Visit: Payer: Self-pay | Admitting: Dermatology

## 2020-10-15 ENCOUNTER — Other Ambulatory Visit: Payer: Self-pay | Admitting: Internal Medicine

## 2020-12-04 ENCOUNTER — Other Ambulatory Visit: Payer: Self-pay | Admitting: Family Medicine

## 2020-12-04 ENCOUNTER — Telehealth: Payer: Self-pay | Admitting: Urology

## 2020-12-04 MED ORDER — FLUCONAZOLE 100 MG PO TABS: 100.0000 mg | ORAL_TABLET | Freq: Every day | ORAL | 0 refills | Status: DC

## 2020-12-04 NOTE — Telephone Encounter (Signed)
Per [name] medication has been sent to pharmacy.

## 2020-12-04 NOTE — Telephone Encounter (Signed)
.  left message to have patient return my call.  Pt would need to be seen, needs appt scheduled.

## 2020-12-04 NOTE — Telephone Encounter (Signed)
Pt tried to get in touch with [name], on her cell.  I told pt [name] was on vacation and she would be back in the office on Wednesday.  He states he';s pretty sure he has a yeast infection and [name] usually sends in Fluconazole to [facility].  Pt didn't want to wait until Wednesday.

## 2020-12-18 ENCOUNTER — Other Ambulatory Visit: Payer: Self-pay

## 2020-12-18 ENCOUNTER — Ambulatory Visit: Payer: Medicare PPO | Admitting: Urology

## 2020-12-18 ENCOUNTER — Other Ambulatory Visit
Admission: RE | Admit: 2020-12-18 | Discharge: 2020-12-18 | Disposition: A | Discharge status: Home / Self Care | Payer: Medicare PPO | Attending: Urology | Admitting: Urology

## 2020-12-18 ENCOUNTER — Encounter: Payer: Self-pay | Admitting: Urology

## 2020-12-18 ENCOUNTER — Other Ambulatory Visit: Payer: Self-pay | Admitting: *Deleted

## 2020-12-18 VITALS — BP 133/72 | HR 55 | Ht 66.0 in | Wt 154.0 lb

## 2020-12-18 DIAGNOSIS — R102 Pelvic and perineal pain: Secondary | ICD-10-CM

## 2020-12-18 DIAGNOSIS — N390 Urinary tract infection, site not specified: Secondary | ICD-10-CM | POA: Diagnosis not present

## 2020-12-18 LAB — URINALYSIS, COMPLETE (UACMP) WITH MICROSCOPIC
Bilirubin Urine: NEGATIVE
Glucose, UA: NEGATIVE mg/dL
Hgb urine dipstick: NEGATIVE
Ketones, ur: NEGATIVE mg/dL
Leukocytes,Ua: NEGATIVE
Nitrite: NEGATIVE
Protein, ur: NEGATIVE mg/dL
Specific Gravity, Urine: 1.01 (ref 1.005–1.030)
pH: 6.5 (ref 5.0–8.0)

## 2020-12-18 MED ORDER — CEPHALEXIN 500 MG PO CAPS
500.0000 mg | ORAL_CAPSULE | Freq: Two times a day (BID) | ORAL | 0 refills | Status: DC
Start: 2020-12-18 — End: 2020-12-26

## 2020-12-18 NOTE — Progress Notes (Signed)
12/18/2020 11:18 PM   Tony Cox [birth year] [id number]  Referring provider: Crecencio Mc, MD Tony Cox,  Tony Cox [id number]  Urological history 1. Bladder cancer - pT2b TCC positive bladder cancer - Status post robotic assisted cystectomy/prostatectomy with ileo conduit diversion performed by Tony. Ardath Cox the on 01/28/2013 for muscle invasive bladder cancer status post neoadjuvant it chemotherapy - Patient underwent urethrectomy with Tony. Darcel Cox on 11/05/2016.  Pathology was benign. - Currently being followed and managed by Tony Tony Cox at [facility] in [place]   2. Prostate cancer - pT2cNO adenocarcinoma of the prostate - Currently being followed and managed by Tony Tony Cox at [facility] in [place] Complaint  Patient presents with   Urinary Tract Infection     HPI: Tony Cox is a 70 y.o. male who presents today for symptoms of an UTI.  For the two weeks, he has been experiencing malaise, pain through the penis, scrotum and perineal area.  He states the pain abates in the morning, but then it ensues as the day progresses.  The pain did not abate with the diflucan that was prescribed last week.    He has had a low grade fever overnight and this morning.  He has also been taking naps which is unusual for him.    Patient denies any modifying or aggravating factors.  Patient denies any gross hematuria, dysuria or suprapubic/flank pain.  Patient denies any chills, nausea or vomiting.    UA few bacteria.    He has not had penile swelling, scrotal swelling or change in BM's.    CT at [facility] in April was negative for recurrence.   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   Hyperlipidemia    Hypertension     Surgical History: Past Surgical History:  Procedure Laterality Date   CARDIAC CATHETERIZATION     CATARACT EXTRACTION W/PHACO Left 11/20/2015   Procedure: CATARACT EXTRACTION PHACO AND INTRAOCULAR LENS PLACEMENT ([place]);  Surgeon: Birder Robson, MD;  Location: [facility] ORS;  Service: Ophthalmology;  Laterality: Left;  [place] AP% CDE fluid pack lot # [id number]   CHOLECYSTECTOMY  2011   COLONOSCOPY  2009, 2013   Tony Vassie Moment ADENOMA    COLONOSCOPY WITH PROPOFOL N/A 07/24/2016   Procedure: COLONOSCOPY WITH PROPOFOL;  Surgeon: Robert Bellow, MD;  Location: [facility] ENDOSCOPY;  Service: Endoscopy;  Laterality: N/A;   cystectomy  2014   ileal conduit diversion    HERNIA REPAIR  [id number]   umbilical    TONSILLECTOMY  AS CHILD   TRANSTHORACIC ECHOCARDIOGRAM  11-03-2008  Tony Cox [name])   LVF NORMAL/ EF 55-60%/ LEFT ATRIAL ENLARGEMENT/  ADEQUATELY FUNCTIONING MITRAL VALVE REPAIR/  MILD MR & TRI/  NO SIGNIFICANT CHANGE FROM PREVIOUS ECHO   TRANSURETHRAL RESECTION OF BLADDER TUMOR N/A 07/27/2012   Procedure: TRANSURETHRAL RESECTION OF BLADDER TUMOR (TURBT);  Surgeon: Franchot Gallo, MD;  Location: [facility];  Service: Urology;  Laterality: N/A;  WITH MITOMYCIN INSTILLATION    urethroptomy  11/05/2016   [place]   valvuloplasty with replacement of cosgrove ring,1 congenital ruptur of cordi  2004   [facility]   mitral valve     Home Medications:  Allergies as of 12/18/2020       Reactions   Codeine Nausea And Vomiting, Hives        Medication List        Accurate as of December 18, 2020 11:18 PM. If you have any questions, ask your nurse or doctor.          albuterol 108 (90 Base) MCG/ACT inhaler Commonly known as: VENTOLIN HFA SMARTSIG:2 Puff(s) Via Inhaler Every 8  Hours PRN   Bystolic 5 MG tablet Generic drug: nebivolol TAKE ONE TABLET EVERY DAY   cephALEXin 500 MG capsule Commonly known as: KEFLEX Take 1 capsule (500 mg total) by mouth 2 (two) times daily for 10 days. Started by: Tony Council, PA-C   Clindamycin Phosphate foam APPLY 1 GRAM ON SKIN DAILY   desonide 0.05 % lotion Commonly known as: DESOWEN   Dexilant 60 MG capsule Generic drug: dexlansoprazole TAKE 1 CAPSULE BY MOUTH EVERY DAY   dicyclomine 10 MG capsule Commonly known as: BENTYL TAKE 1 CAPSULE BY MOUTH TWICE DAILY   fluconazole 100 MG tablet Commonly known as: DIFLUCAN Take 1 tablet (100 mg total) by mouth daily. X 7 days   gabapentin 300 MG capsule Commonly known as: NEURONTIN TAKE 1 CAPSULE BY MOUTH TWICE DAILY   hydrocortisone-pramoxine 2.5-1 % rectal cream Commonly known as: ANALPRAM-HC APPLY A SMALL AMOUNT TWICE  A DAY AS NEEDED   meloxicam 15 MG tablet Commonly known as: MOBIC Take 1 tablet (15 mg total) by mouth daily.   metroNIDAZOLE 1 % gel Commonly known as: METROGEL APLLY TOPICALLY EVERY DAY   mirtazapine 15 MG disintegrating tablet Commonly known as: REMERON SOL-TAB TAKE ONE TABLET BY MOUTH AT BEDTIME   mometasone 0.1 % cream Commonly known as: ELOCON APPLY TO AFFECTED AREAS TWICE DAILY AS NEEDED FOR RASH   mupirocin ointment 2 % Commonly known as: BACTROBAN APPLY A SMALL AMOUNT TO AFFECTED AREA TWICE DAILY   nystatin powder Commonly known as: MYCOSTATIN/NYSTOP Apply topically 4 (four) times daily.   polyethylene glycol 17 g packet Commonly known as: MIRALAX / GLYCOLAX Take 17 g by mouth as needed.   PROBIOTIC & ACIDOPHILUS EX ST PO Take 2 tablets by mouth at bedtime as needed.   traMADol 50 MG tablet Commonly known as: ULTRAM Take 1 tablet (50 mg total) by mouth every 6 (six) hours as needed.   triamcinolone cream 0.1 % Commonly known as: KENALOG   Unisom SleepTabs 25 MG tablet Generic drug: doxylamine (Sleep) Take by  mouth.        Allergies:  Allergies  Allergen Reactions   Codeine Nausea And Vomiting and Hives    Family History: Family History  Adopted: Yes  Problem Relation Age of Onset   Colon cancer Mother    Cancer Neg Hx        Prostate,Kidney,Bladder   Prostate cancer Neg Hx    Bladder Cancer Neg Hx    Kidney cancer Neg Hx     Social History:  reports that he quit smoking about 8 years ago. His smoking use included cigarettes. He has never used smokeless tobacco. He reports current alcohol use of about 1.0 standard drink per week. He reports that he does not use drugs.  ROS: Pertinent ROS in HPI  Physical Exam: BP 133/72  Pulse (!) 55   Ht '5\' 6"'$  (1.676 m)   Wt 154 lb (69.9 kg)   BMI 24.86 kg/m   Constitutional:  Well nourished. Alert and oriented, No acute distress. HEENT: [place] AT, mask in place.  Trachea midline Cardiovascular: No clubbing, cyanosis, or edema. Respiratory: Normal respiratory effort, no increased work of breathing. GU: No CVA tenderness.  Stoma is pink and healthy.  Urine is clear in the bag.  Patient with uncircumcised phallus. Foreskin easily retracted  No crepitus, no fluctuance or erythema noted.  Scrotum without lesions, cysts, rashes and/or edema.  Testicles are located scrotally bilaterally. No masses are appreciated in the testicles. Left and right epididymis are normal.  Right hydrocele appreciated.   Rectal: Patient with  normal sphincter tone. Anus and perineum without scarring or rashes. No rectal masses are appreciated. Pain with palpation on the right vault.   Prostate is surgically absent. Neurologic: Grossly intact, no focal deficits, moving all 4 extremities. Psychiatric: Normal mood and affect.   Laboratory Data: Lab Results  Component Value Date   WBC 4.5 02/10/2020   HGB 14.5 02/10/2020   HCT 42.7 02/10/2020   MCV 89.0 02/10/2020   PLT 191.0 02/10/2020    Lab Results  Component Value Date   CREATININE 0.87 02/11/2020    Lab  Results  Component Value Date   PSA 2.1 10/19/2012   PSA 3.16 05/31/2011   PSA 1.80 10/09/2009     Lab Results  Component Value Date   TSH 1.05 02/10/2020       Component Value Date/Time   CHOL 161 09/08/2019 1627   HDL 44.80 09/08/2019 1627   CHOLHDL 4 09/08/2019 1627   VLDL 33.2 09/08/2019 1627   LDLCALC 83 09/08/2019 1627    Lab Results  Component Value Date   AST 20 02/11/2020   Lab Results  Component Value Date   ALT 20 02/11/2020    Urinalysis Component     Latest Ref Rng & Units 12/18/2020  Color, Urine     YELLOW STRAW (A)  Appearance     CLEAR CLEAR  Specific Gravity, Urine     1.005 - 1.030 1.010  pH     5.0 - 8.0 6.5  Glucose, UA     NEGATIVE mg/dL NEGATIVE  Hgb urine dipstick     NEGATIVE NEGATIVE  Bilirubin Urine     NEGATIVE NEGATIVE  Ketones, ur     NEGATIVE mg/dL NEGATIVE  Protein     NEGATIVE mg/dL NEGATIVE  Nitrite     NEGATIVE NEGATIVE  Leukocytes,Ua     NEGATIVE NEGATIVE  Squamous Epithelial / LPF     0 - 5 0-5  WBC, UA     0 - 5 WBC/hpf 0-5  RBC / HPF     0 - 5 RBC/hpf 0-5  Bacteria, UA     NONE SEEN FEW (A)  I have reviewed the labs.   Pertinent Imaging: No recent imaging  Assessment & Plan:    1. Perineal pain - discussed symptoms with Tony. Haynes Bast nurse and they asked for Tony Cox to send pictures of his perineum via his patient portal - UA - urine sent for culture - started Keflex 500 mg, BID x 7 days -will adjust once culture results are available -  has a trip planned to [place] next week and will see Tony. Darcel Cox for consultation  Return for pending urine culture results .  These notes generated with voice recognition software. I apologize for  typographical errors.  Tony Council, PA-C  [facility] [address]  [place], [place] [address] [phone]  I spent 30 minutes on the day of the encounter to include pre-visit record review, face-to-face time with the  patient, and post-visit ordering of tests.

## 2020-12-19 ENCOUNTER — Encounter: Payer: Self-pay | Admitting: Dermatology

## 2020-12-19 ENCOUNTER — Ambulatory Visit: Payer: Medicare PPO | Admitting: Dermatology

## 2020-12-19 DIAGNOSIS — B009 Herpesviral infection, unspecified: Secondary | ICD-10-CM | POA: Diagnosis not present

## 2020-12-19 DIAGNOSIS — L304 Erythema intertrigo: Secondary | ICD-10-CM

## 2020-12-19 MED ORDER — VALACYCLOVIR HCL 1 G PO TABS: 1000.0000 mg | ORAL_TABLET | Freq: Two times a day (BID) | ORAL | 11 refills | Status: DC

## 2020-12-19 NOTE — Patient Instructions (Addendum)
Diagnosis - Intertrigo with possible asymptomatic shedding of HSV   Instructions for [facility] Medications  One or more of your medications was sent to the [facility] mail order compounding pharmacy. You will receive an email from them and can purchase the medicine through that link. It will then be mailed to your home at the address you confirmed. If for any reason you do not receive an email from them, please check your spam folder. If you still do not find the email, please let us know. [facility] phone number is [phone].  If you have any questions or concerns for your doctor, please call our main line at [phone] and press option 4 to reach your doctor's medical assistant. If no one answers, please leave a voicemail as directed and we will return your call as soon as possible. Messages left after 4 pm will be answered the following business day.   You may also send [place] a message via [place]. We typically respond to MyChart messages within 1-2 business days.  For prescription refills, please ask your pharmacy to contact our office. Our fax number is [phone].  If you have an urgent issue when the clinic is closed that cannot wait until the next business day, you can page your doctor at the number below.    Please note that while we do our best to be available for urgent issues outside of office hours, we are not available 24/7.   If you have an urgent issue and are unable to reach [place], you may choose to seek medical care at your doctor's office, retail clinic, urgent care center, or emergency room.  If you have a medical emergency, please immediately call 911 or go to the emergency department.  Pager Numbers  - Dr. Nehemiah Massed: [phone]  - Dr. Laurence Ferrari: [phone]  - Dr. Nicole Kindred: [phone]  In the event of inclement weather, please call our main line at [phone] for an update on the status of any delays or closures.  Dermatology Medication  Tips: Please keep the boxes that topical medications come in in order to help keep track of the instructions about where and how to use these. Pharmacies typically print the medication instructions only on the boxes and not directly on the medication tubes.   If your medication is too expensive, please contact our office at [phone] option 4 or send [place] a message through [place].   We are unable to tell what your co-pay for medications will be in advance as this is different depending on your insurance coverage. However, we may be able to find a substitute medication at lower cost or fill out paperwork to get insurance to cover a needed medication.   If a prior authorization is required to get your medication covered by your insurance company, please allow [place] 1-2 business days to complete this process.  Drug prices often vary depending on where the prescription is filled and some pharmacies may offer cheaper prices.  The website www.goodrx.com contains coupons for medications through different pharmacies. The prices here do not account for what the cost may be with help from insurance (it may be cheaper with your insurance), but the website can give you the price if you did not use any insurance.  - You can print the associated coupon and take it with your prescription to the pharmacy.  - You may also stop by our office during regular business hours and pick up a GoodRx coupon card.  - If you need your  prescription sent electronically to a different pharmacy, notify our office through [facility] or by phone at [phone] option 4.

## 2020-12-19 NOTE — Progress Notes (Signed)
   Follow-Up Visit   Subjective  Tony Cox is a 70 y.o. male who presents for the following: Rash (Penis - painful and itches some - His urologist checked his urine yesterday and it was negative but she sent it out for culture just to be sure and he was given Keflex - he has had 2 doses so far.). He is concerned about possible yeast infection but he took oral Diflucan for a week last week.  He has history of herpes simplex of the genital area and took a couple doses of valacyclovir last week but he had no other medication as he ran out.  He tends to have some moisture in the foreskin area as he is uncircumcised.  The following portions of the chart were reviewed this encounter and updated as appropriate:   Tobacco  Allergies  Meds  Problems  Med Hx  Surg Hx  Fam Hx     Review of Systems:  No other skin or systemic complaints except as noted in HPI or Assessment and Plan.  Objective  Well appearing patient in no apparent distress; mood and affect are within normal limits.  A focused examination was performed including genital area. Relevant physical exam findings are noted in the Assessment and Plan.  Penis Fine papular eruption   Assessment & Plan  Erythema intertrigo Penis especially under the foreskin area  Start Iodoquinol 1%/Hydrocortisone 2.5%/Niacinamide 2% cream bid - sent to [facility]  May consider oral fluconazole again if not improving or if evidence of yeast infection from taking oral antibiotics Keflex as prescribed by his urologist  Intertrigo is a chronic recurrent rash that occurs in skin fold areas that may be associated with friction; heat; moisture; yeast; fungus; and bacteria.  It is exacerbated by increased movement / activity; sweating; and higher atmospheric temperature.  HSV infection -history of.  He may be experiencing some inflammation with asymptomatic shedding without obvious blisters today.   Penis Restart Valacyclovir 1g 1 po bid x 1  week Patient declines culture today and I agree. valACYclovir (VALTREX) 1000 MG tablet - Penis Take 1 tablet (1,000 mg total) by mouth 2 (two) times daily.  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  It is infectious and can be spread from person to person by direct contact.  It is not curable, but is treatable with topical and oral medication.  Return if symptoms worsen or fail to improve.  I, Ashok Cordia, CMA, am acting as scribe for Sarina Ser, MD . Documentation: I have reviewed the above documentation for accuracy and completeness, and I agree with the above.  Sarina Ser, MD

## 2020-12-22 ENCOUNTER — Other Ambulatory Visit: Payer: Self-pay | Admitting: Urology

## 2020-12-22 DIAGNOSIS — N390 Urinary tract infection, site not specified: Secondary | ICD-10-CM

## 2020-12-22 LAB — URINE CULTURE: Culture: 50000 — AB

## 2020-12-22 MED ORDER — NITROFURANTOIN MONOHYD MACRO 100 MG PO CAPS: 100.0000 mg | ORAL_CAPSULE | Freq: Two times a day (BID) | ORAL | 0 refills | Status: DC

## 2020-12-26 ENCOUNTER — Ambulatory Visit: Payer: Medicare PPO | Admitting: Urology

## 2020-12-26 ENCOUNTER — Other Ambulatory Visit: Payer: Self-pay | Admitting: Urology

## 2020-12-26 MED ORDER — CEPHALEXIN 500 MG PO CAPS: 500.0000 mg | ORAL_CAPSULE | Freq: Two times a day (BID) | ORAL | 0 refills | Status: AC

## 2020-12-26 NOTE — Progress Notes (Signed)
Refill for Keflex sent to pharmacy.

## 2020-12-29 ENCOUNTER — Other Ambulatory Visit: Payer: Self-pay | Admitting: Urology

## 2020-12-29 MED ORDER — MUPIROCIN 2 % EX OINT: TOPICAL_OINTMENT | CUTANEOUS | 11 refills | Status: DC

## 2020-12-31 ENCOUNTER — Other Ambulatory Visit: Payer: Self-pay | Admitting: Urology

## 2020-12-31 DIAGNOSIS — N390 Urinary tract infection, site not specified: Secondary | ICD-10-CM

## 2020-12-31 NOTE — Progress Notes (Signed)
Tony Cox is still having pain in the penis with no fevers, chills, nausea or vomiting.  He will come to the office tomorrow for repeat UA and urine culture and CBC and CMP.

## 2021-01-01 ENCOUNTER — Other Ambulatory Visit: Payer: Self-pay | Admitting: Family Medicine

## 2021-01-01 ENCOUNTER — Other Ambulatory Visit: Payer: Self-pay | Admitting: Urology

## 2021-01-01 ENCOUNTER — Other Ambulatory Visit: Payer: Self-pay

## 2021-01-01 ENCOUNTER — Other Ambulatory Visit: Payer: Medicare PPO

## 2021-01-01 DIAGNOSIS — N390 Urinary tract infection, site not specified: Secondary | ICD-10-CM | POA: Diagnosis not present

## 2021-01-02 ENCOUNTER — Other Ambulatory Visit: Payer: Medicare PPO

## 2021-01-02 DIAGNOSIS — Z936 Other artificial openings of urinary tract status: Secondary | ICD-10-CM | POA: Diagnosis not present

## 2021-01-02 LAB — CBC WITH DIFFERENTIAL/PLATELET
Basophils Absolute: 0.1 10*3/uL (ref 0.0–0.2)
Basos: 1 %
EOS (ABSOLUTE): 0.1 10*3/uL (ref 0.0–0.4)
Eos: 1 %
Hematocrit: 43.7 % (ref 37.5–51.0)
Hemoglobin: 15.7 g/dL (ref 13.0–17.7)
Immature Grans (Abs): 0 10*3/uL (ref 0.0–0.1)
Immature Granulocytes: 0 %
Lymphocytes Absolute: 1.7 10*3/uL (ref 0.7–3.1)
Lymphs: 25 %
MCH: 31.3 pg (ref 26.6–33.0)
MCHC: 35.9 g/dL — ABNORMAL HIGH (ref 31.5–35.7)
MCV: 87 fL (ref 79–97)
Monocytes Absolute: 0.6 10*3/uL (ref 0.1–0.9)
Monocytes: 9 %
Neutrophils Absolute: 4.5 10*3/uL (ref 1.4–7.0)
Neutrophils: 64 %
Platelets: 220 10*3/uL (ref 150–450)
RBC: 5.01 x10E6/uL (ref 4.14–5.80)
RDW: 13.1 % (ref 11.6–15.4)
WBC: 7 10*3/uL (ref 3.4–10.8)

## 2021-01-02 LAB — COMPREHENSIVE METABOLIC PANEL
ALT: 27 IU/L (ref 0–44)
AST: 23 IU/L (ref 0–40)
Albumin/Globulin Ratio: 2.1 (ref 1.2–2.2)
Albumin: 4.9 g/dL — ABNORMAL HIGH (ref 3.8–4.8)
Alkaline Phosphatase: 67 IU/L (ref 44–121)
BUN/Creatinine Ratio: 16 (ref 10–24)
BUN: 13 mg/dL (ref 8–27)
Bilirubin Total: 0.4 mg/dL (ref 0.0–1.2)
CO2: 24 mmol/L (ref 20–29)
Calcium: 9.5 mg/dL (ref 8.6–10.2)
Chloride: 98 mmol/L (ref 96–106)
Creatinine, Ser: 0.81 mg/dL (ref 0.76–1.27)
Globulin, Total: 2.3 g/dL (ref 1.5–4.5)
Glucose: 82 mg/dL (ref 65–99)
Potassium: 4.3 mmol/L (ref 3.5–5.2)
Sodium: 137 mmol/L (ref 134–144)
Total Protein: 7.2 g/dL (ref 6.0–8.5)
eGFR: 95 mL/min/{1.73_m2} (ref >59)

## 2021-01-03 LAB — URINALYSIS, COMPLETE
Bilirubin, UA: NEGATIVE
Glucose, UA: NEGATIVE
Ketones, UA: NEGATIVE
Leukocytes,UA: NEGATIVE
Nitrite, UA: NEGATIVE
Protein,UA: NEGATIVE
RBC, UA: NEGATIVE
Specific Gravity, UA: <1.005 — ABNORMAL LOW (ref 1.005–1.030)
Urobilinogen, Ur: 0.2 mg/dL (ref 0.2–1.0)
pH, UA: 6.5 (ref 5.0–7.5)

## 2021-01-03 LAB — MICROSCOPIC EXAMINATION

## 2021-01-04 LAB — CULTURE, URINE COMPREHENSIVE

## 2021-01-08 DIAGNOSIS — C680 Malignant neoplasm of urethra: Secondary | ICD-10-CM | POA: Diagnosis not present

## 2021-01-08 DIAGNOSIS — Z8551 Personal history of malignant neoplasm of bladder: Secondary | ICD-10-CM | POA: Diagnosis not present

## 2021-01-08 DIAGNOSIS — Z87891 Personal history of nicotine dependence: Secondary | ICD-10-CM | POA: Diagnosis not present

## 2021-01-08 DIAGNOSIS — Z9221 Personal history of antineoplastic chemotherapy: Secondary | ICD-10-CM | POA: Diagnosis not present

## 2021-01-08 DIAGNOSIS — F329 Major depressive disorder, single episode, unspecified: Secondary | ICD-10-CM | POA: Diagnosis not present

## 2021-01-08 DIAGNOSIS — I861 Scrotal varices: Secondary | ICD-10-CM | POA: Diagnosis not present

## 2021-01-08 DIAGNOSIS — N433 Hydrocele, unspecified: Secondary | ICD-10-CM | POA: Diagnosis not present

## 2021-01-08 DIAGNOSIS — Z8546 Personal history of malignant neoplasm of prostate: Secondary | ICD-10-CM | POA: Diagnosis not present

## 2021-01-08 DIAGNOSIS — C679 Malignant neoplasm of bladder, unspecified: Secondary | ICD-10-CM | POA: Diagnosis not present

## 2021-01-08 DIAGNOSIS — C61 Malignant neoplasm of prostate: Secondary | ICD-10-CM | POA: Diagnosis not present

## 2021-02-15 DIAGNOSIS — Z8551 Personal history of malignant neoplasm of bladder: Secondary | ICD-10-CM | POA: Diagnosis not present

## 2021-02-15 DIAGNOSIS — Z87891 Personal history of nicotine dependence: Secondary | ICD-10-CM | POA: Diagnosis not present

## 2021-02-15 DIAGNOSIS — F329 Major depressive disorder, single episode, unspecified: Secondary | ICD-10-CM | POA: Diagnosis not present

## 2021-02-15 DIAGNOSIS — Z9221 Personal history of antineoplastic chemotherapy: Secondary | ICD-10-CM | POA: Diagnosis not present

## 2021-02-15 DIAGNOSIS — Z8546 Personal history of malignant neoplasm of prostate: Secondary | ICD-10-CM | POA: Diagnosis not present

## 2021-02-15 DIAGNOSIS — C679 Malignant neoplasm of bladder, unspecified: Secondary | ICD-10-CM | POA: Diagnosis not present

## 2021-05-06 IMAGING — US US SCROTUM W/ DOPPLER COMPLETE
1 series · 14 of 25 positions shown · non-contrast
Comparison: None

CLINICAL DATA: RIGHT hydrocele

EXAM:
SCROTAL ULTRASOUND
DOPPLER ULTRASOUND OF THE TESTICLES
TECHNIQUE: Complete ultrasound examination of the testicles, epididymis, and
other scrotal structures was performed. Color and spectral Doppler
ultrasound were also utilized to evaluate blood flow to the
testicles.

[Series 1: us scrotum w/ doppler complete · 0.08mm/px · 14 of 46 slices shown]
[im 1/46]
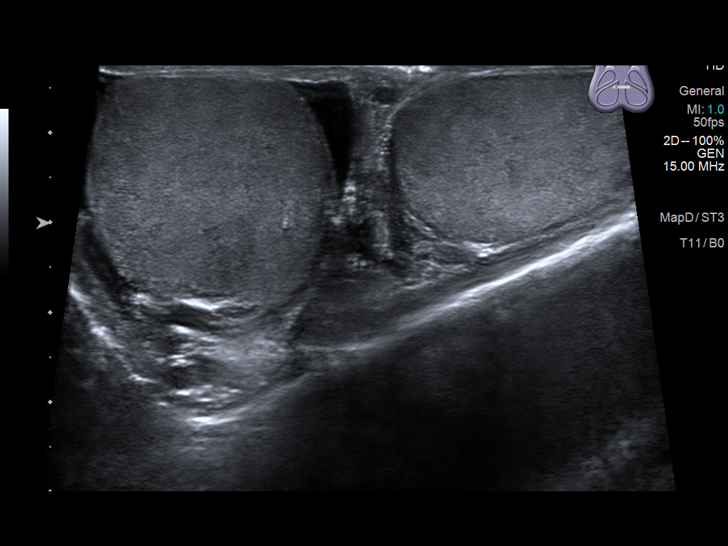
[im 4/46]
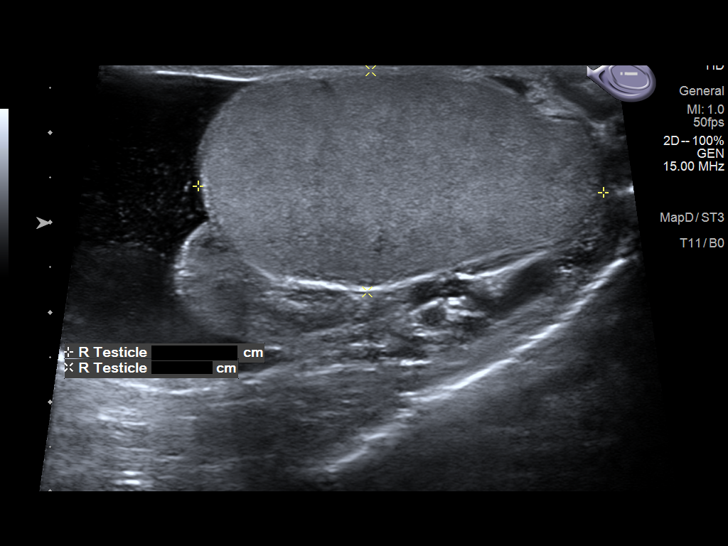
[im 8/46]
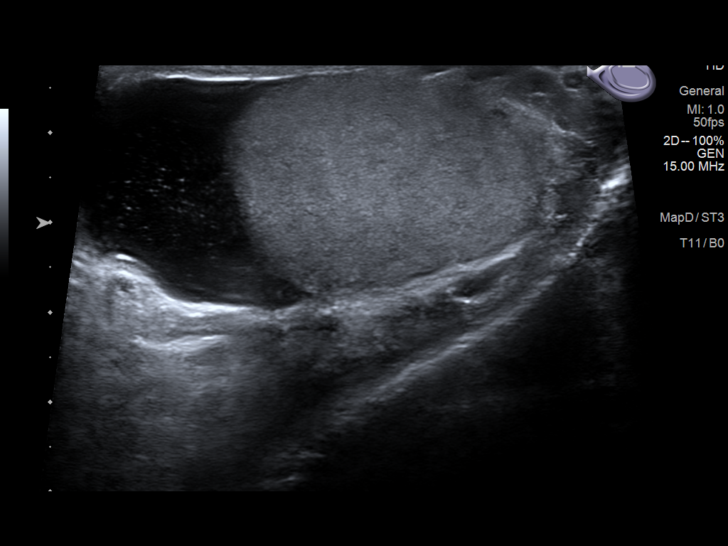
[im 12/46]
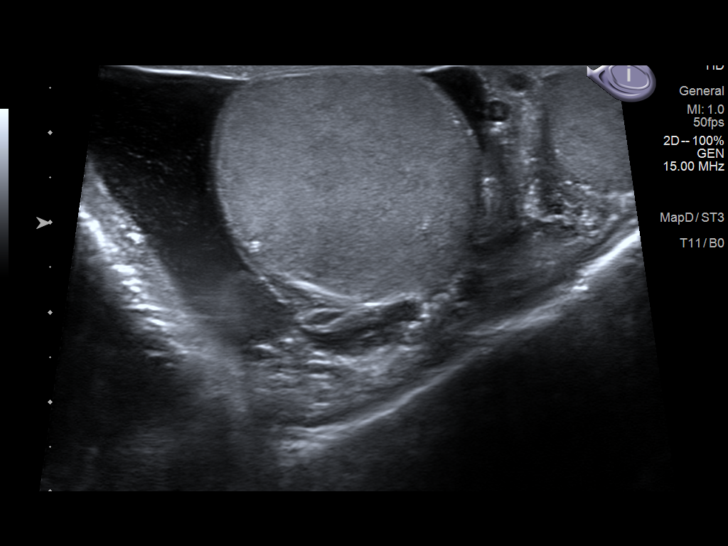
[im 16/46]
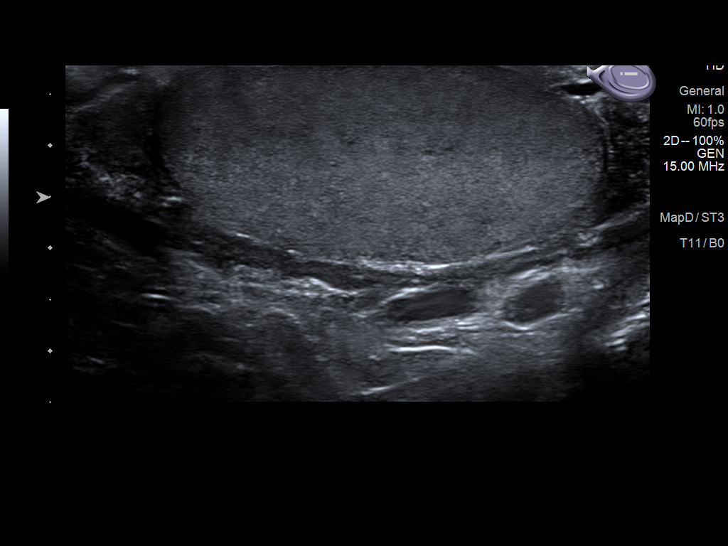
[im 17/46]
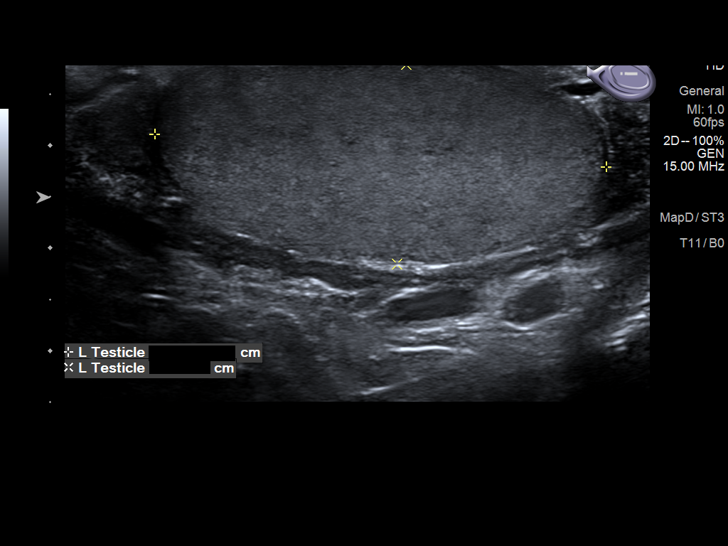
[im 21/46]
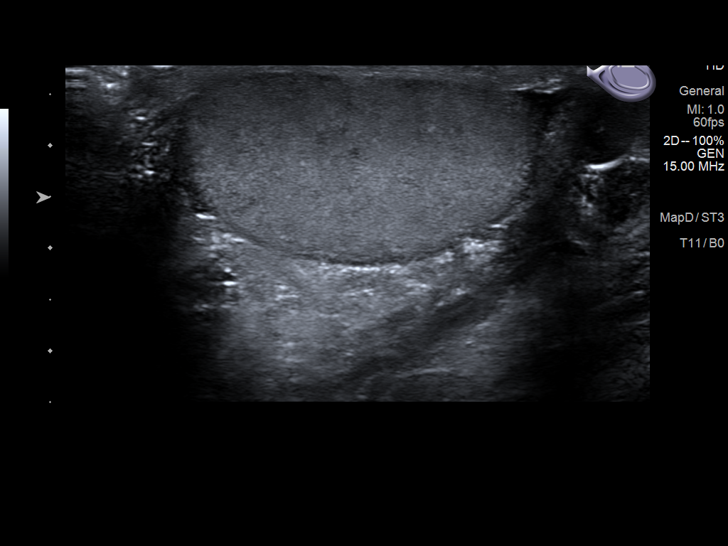
[im 25/46]
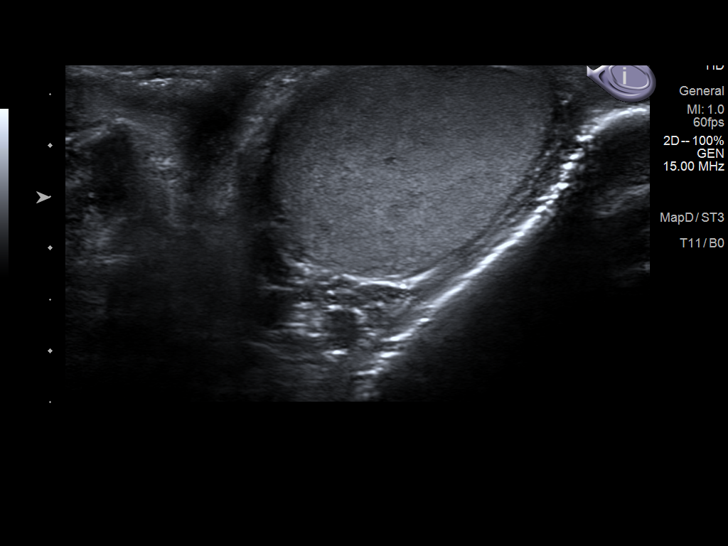
[im 29/46]
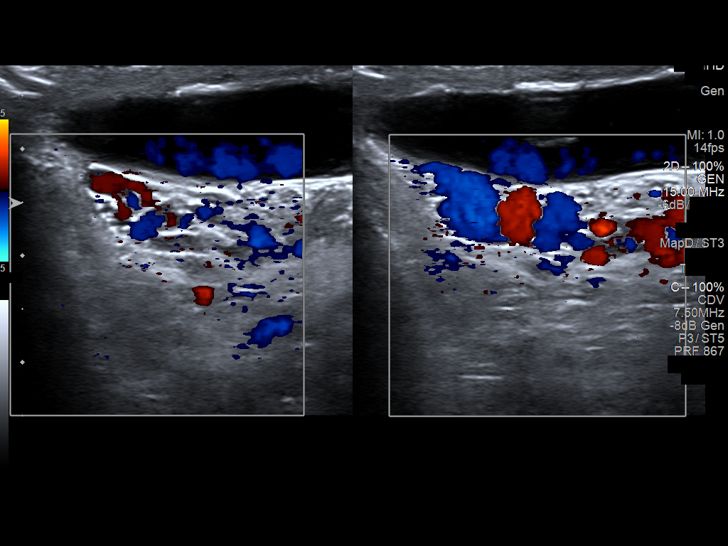
[im 31/46]
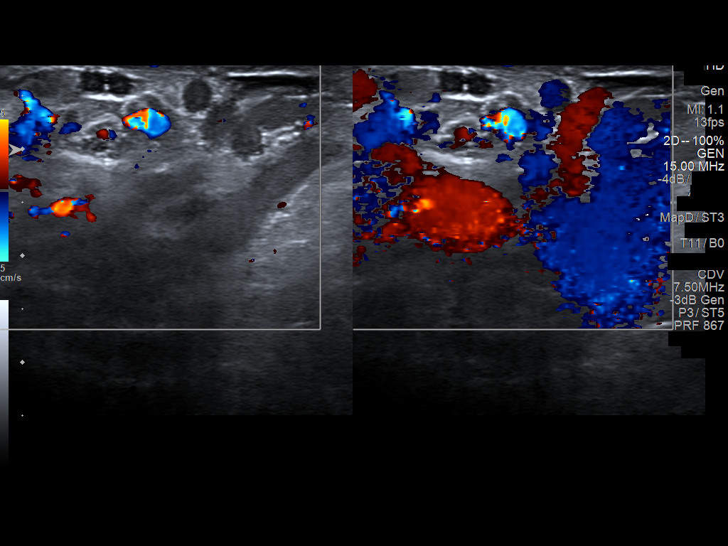
[im 34/46]
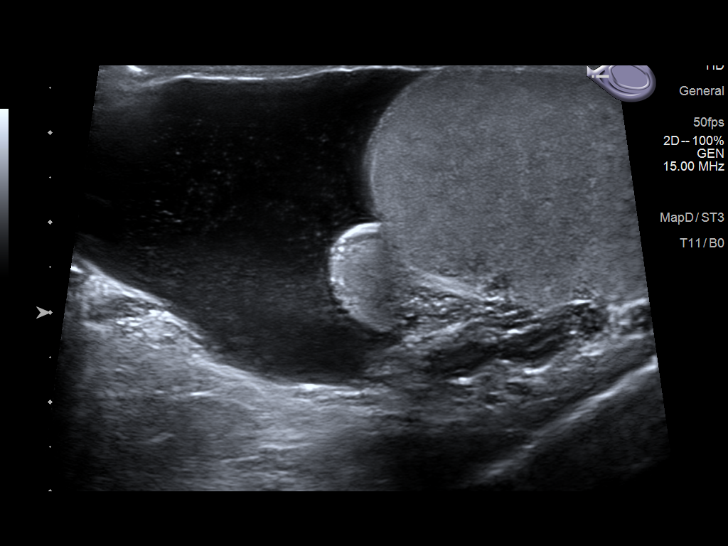
[im 38/46]
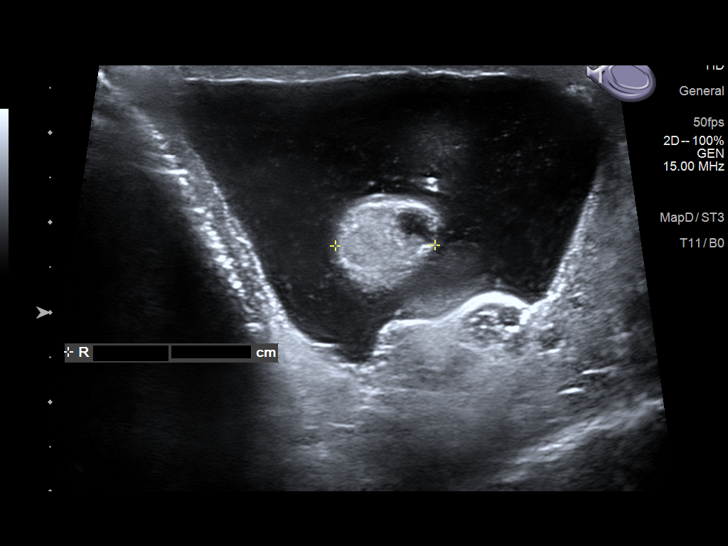
[im 42/46]
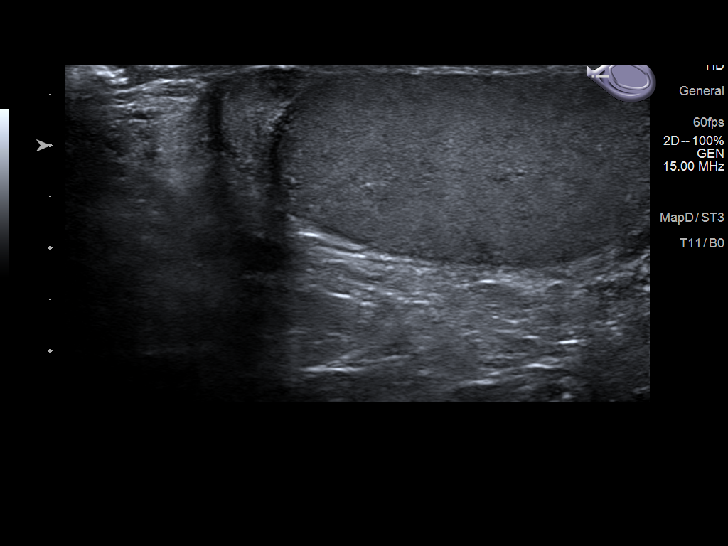
[im 46/46]
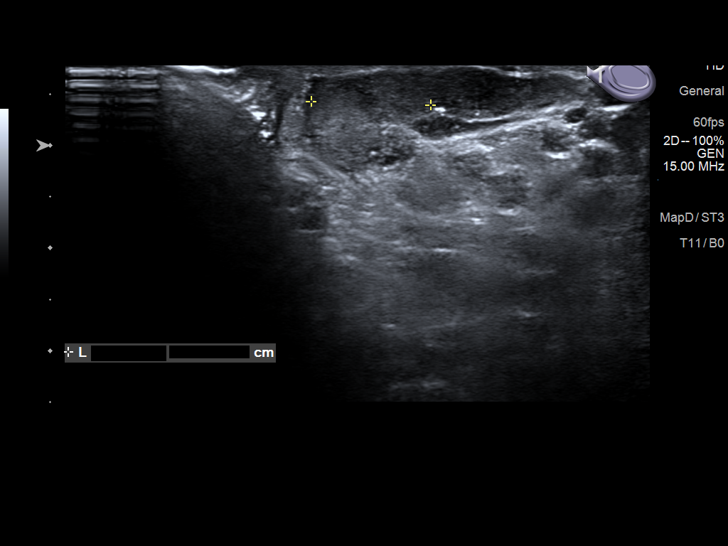

[14 of 25 positions shown; findings below may reference images not displayed]

FINDINGS: Right testicle

Measurements: 4.5 x 2.5 x 2.8 cm. Normal echogenicity without mass
or calcification. Internal blood flow present on color Doppler
imaging.

Left testicle

Measurements: 4.4 x 2.0 x 2.7 cm. Normal echogenicity without mass
or calcification. Internal blood flow present on color Doppler
imaging.

Right epididymis:  Small cyst at RIGHT epididymal head 4 x 4 x 4 mm

Left epididymis:  Normal in size and appearance.

Hydrocele:  Large RIGHT hydrocele containing debris

Varicocele:  No RIGHT varicocele.  Equivocal for LEFT varicocele.

Pulsed Doppler interrogation of both testes demonstrates normal low
resistance arterial and venous waveforms bilaterally.
IMPRESSION: Large RIGHT hydrocele containing debris.

Small cyst RIGHT epididymal head 4 mm diameter.

Equivocal for LEFT varicocele.

## 2021-05-08 ENCOUNTER — Other Ambulatory Visit: Payer: Self-pay | Admitting: Internal Medicine

## 2021-05-31 ENCOUNTER — Ambulatory Visit: Payer: Medicare PPO | Admitting: Dermatology

## 2021-05-31 ENCOUNTER — Other Ambulatory Visit: Payer: Self-pay

## 2021-05-31 DIAGNOSIS — L820 Inflamed seborrheic keratosis: Secondary | ICD-10-CM | POA: Diagnosis not present

## 2021-05-31 DIAGNOSIS — L988 Other specified disorders of the skin and subcutaneous tissue: Secondary | ICD-10-CM | POA: Diagnosis not present

## 2021-05-31 DIAGNOSIS — L578 Other skin changes due to chronic exposure to nonionizing radiation: Secondary | ICD-10-CM | POA: Diagnosis not present

## 2021-05-31 DIAGNOSIS — Z86018 Personal history of other benign neoplasm: Secondary | ICD-10-CM

## 2021-05-31 DIAGNOSIS — L570 Actinic keratosis: Secondary | ICD-10-CM

## 2021-05-31 NOTE — Patient Instructions (Signed)

## 2021-05-31 NOTE — Progress Notes (Signed)
° °  Follow-Up Visit   Subjective  Tony Cox is a 71 y.o. male who presents for the following: Other (Spots on scalp that get irritated and itchy. They peel off and come back.). The patient has spots, moles and lesions to be evaluated, some may be new or changing and the patient has concerns that these could be cancer.  The following portions of the chart were reviewed this encounter and updated as appropriate:   Tobacco   Allergies   Meds   Problems   Med Hx   Surg Hx   Fam Hx      Review of Systems:  No other skin or systemic complaints except as noted in HPI or Assessment and Plan.  Objective  Well appearing patient in no apparent distress; mood and affect are within normal limits.  A focused examination was performed including face, scalp. Relevant physical exam findings are noted in the Assessment and Plan.  Left crown Hypertrophic papule  Scalp (2) Erythematous stuck-on, waxy papule or plaque  Anterior neck Rhytides and volume loss.    Assessment & Plan   Actinic Damage - chronic, secondary to cumulative UV radiation exposure/sun exposure over time - diffuse scaly erythematous macules with underlying dyspigmentation - Recommend daily broad spectrum sunscreen SPF 30+ to sun-exposed areas, reapply every 2 hours as needed.  - Recommend staying in the shade or wearing long sleeves, sun glasses (UVA+UVB protection) and wide brim hats (4-inch brim around the entire circumference of the hat). - Call for new or changing lesions.  History of Biopsy proven Fibrous papule of right nose supratip - Benign Clear today.  AK (actinic keratosis) Left crown scalp Recheck on follow up  Destruction of lesion - Left crown Complexity: simple   Destruction method: cryotherapy   Informed consent: discussed and consent obtained   Timeout:  patient name, date of birth, surgical site, and procedure verified Lesion destroyed using liquid nitrogen: Yes   Region frozen until ice ball  extended beyond lesion: Yes   Outcome: patient tolerated procedure well with no complications   Post-procedure details: wound care instructions given    Inflamed seborrheic keratosis (2) Scalp Destruction of lesion - Scalp Complexity: simple   Destruction method: cryotherapy   Informed consent: discussed and consent obtained   Timeout:  patient name, date of birth, surgical site, and procedure verified Lesion destroyed using liquid nitrogen: Yes   Region frozen until ice ball extended beyond lesion: Yes   Outcome: patient tolerated procedure well with no complications   Post-procedure details: wound care instructions given    Elastosis of skin Anterior neck Recommend evaluation with Dr. Ron Parker.  Return in about 2 months (around 07/29/2021).  I, Ashok Cordia, CMA, am acting as scribe for Sarina Ser, MD . Documentation: I have reviewed the above documentation for accuracy and completeness, and I agree with the above.  Sarina Ser, MD

## 2021-06-05 ENCOUNTER — Encounter: Payer: Self-pay | Admitting: Internal Medicine

## 2021-06-05 ENCOUNTER — Ambulatory Visit (INDEPENDENT_AMBULATORY_CARE_PROVIDER_SITE_OTHER): Payer: Medicare PPO | Admitting: Internal Medicine

## 2021-06-05 ENCOUNTER — Encounter: Payer: Self-pay | Admitting: Dermatology

## 2021-06-05 ENCOUNTER — Other Ambulatory Visit: Payer: Self-pay

## 2021-06-05 VITALS — BP 122/68 | HR 62 | Ht 66.0 in | Wt 156.0 lb

## 2021-06-05 DIAGNOSIS — R635 Abnormal weight gain: Secondary | ICD-10-CM

## 2021-06-05 DIAGNOSIS — F5101 Primary insomnia: Secondary | ICD-10-CM

## 2021-06-05 DIAGNOSIS — F320 Major depressive disorder, single episode, mild: Secondary | ICD-10-CM

## 2021-06-05 DIAGNOSIS — E782 Mixed hyperlipidemia: Secondary | ICD-10-CM | POA: Diagnosis not present

## 2021-06-05 DIAGNOSIS — I10 Essential (primary) hypertension: Secondary | ICD-10-CM

## 2021-06-05 DIAGNOSIS — Z8551 Personal history of malignant neoplasm of bladder: Secondary | ICD-10-CM

## 2021-06-05 DIAGNOSIS — G629 Polyneuropathy, unspecified: Secondary | ICD-10-CM

## 2021-06-05 DIAGNOSIS — Z23 Encounter for immunization: Secondary | ICD-10-CM

## 2021-06-05 MED ORDER — DICYCLOMINE HCL 10 MG PO CAPS: 10.0000 mg | ORAL_CAPSULE | Freq: Two times a day (BID) | ORAL | 0 refills | Status: DC

## 2021-06-05 MED ORDER — CARVEDILOL PHOSPHATE ER 10 MG PO CP24: 10.0000 mg | ORAL_CAPSULE | Freq: Every day | ORAL | 1 refills | Status: DC

## 2021-06-05 MED ORDER — CARVEDILOL PHOSPHATE ER 10 MG PO CP24: 10.0000 mg | ORAL_CAPSULE | Freq: Every day | ORAL | 0 refills | Status: DC

## 2021-06-05 MED ORDER — DEXILANT 60 MG PO CPDR: 60.0000 mg | DELAYED_RELEASE_CAPSULE | Freq: Every day | ORAL | 1 refills | Status: DC

## 2021-06-05 MED ORDER — GABAPENTIN 300 MG PO CAPS: ORAL_CAPSULE | ORAL | 1 refills | Status: DC

## 2021-06-05 MED ORDER — DICYCLOMINE HCL 10 MG PO CAPS: 10.0000 mg | ORAL_CAPSULE | Freq: Two times a day (BID) | ORAL | 1 refills | Status: DC

## 2021-06-05 MED ORDER — GABAPENTIN 300 MG PO CAPS: ORAL_CAPSULE | ORAL | 0 refills | Status: DC

## 2021-06-05 MED ORDER — MIRTAZAPINE 15 MG PO TBDP: 15.0000 mg | ORAL_TABLET | Freq: Every day | ORAL | 1 refills | Status: DC

## 2021-06-05 MED ORDER — MIRTAZAPINE 15 MG PO TBDP: 15.0000 mg | ORAL_TABLET | Freq: Every day | ORAL | 0 refills | Status: DC

## 2021-06-05 MED ORDER — DEXILANT 60 MG PO CPDR: 60.0000 mg | DELAYED_RELEASE_CAPSULE | Freq: Every day | ORAL | 0 refills | Status: DC

## 2021-06-05 NOTE — Assessment & Plan Note (Signed)
He has had no recurrence after 5 years and continues to see his oncologist at [facility]

## 2021-06-05 NOTE — Progress Notes (Signed)
Subjective:  Patient ID: Tony Cox, male    DOB: [birth year]  Age: 71 y.o. MRN: [id number]  CC: The primary encounter diagnosis was Mixed hyperlipidemia. Diagnoses of Primary insomnia, Sensory neuropathy ([place]), Depression, major, single episode, mild ([place]), Primary hypertension, Weight gain, Need for pneumococcal vaccination, and Personal history of bladder cancer were also pertinent to this visit.   This visit occurred during the SARS-CoV-2 public health emergency.  Safety protocols were in place, including screening questions prior to the visit, additional usage of staff PPE, and extensive cleaning of exam room while observing appropriate contact time as indicated for disinfecting solutions.    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 Coreg Cr 10 mg was previous dose  prescribed by [facility] in 2021.  Wants to resume it. 10 day dose to [facility]   GERD:  managed with Dexilant.  Prior trials of omeprazole were not effective .    Lab Results  Component Value Date   CHOL 161 09/08/2019   HDL 44.80 09/08/2019   LDLCALC 83 09/08/2019   LDLDIRECT 135.5 01/16/2010   TRIG 166.0 (H) 09/08/2019   CHOLHDL 4 09/08/2019      Outpatient Medications Prior to Visit  Medication Sig Dispense Refill   Clindamycin Phosphate foam APPLY 1 GRAM ON SKIN DAILY  5   desonide (DESOWEN) 0.05 % lotion      doxylamine, Sleep, (UNISOM SLEEPTABS) 25 MG tablet Take by mouth.     hydrocortisone-pramoxine (ANALPRAM-HC) 2.5-1 % rectal cream APPLY A SMALL AMOUNT TWICE  A DAY AS NEEDED 30 g 0   meloxicam (MOBIC) 15 MG tablet Take 1 tablet (15 mg total) by mouth daily. 30 tablet 5   metroNIDAZOLE (METROGEL) 1 % gel APLLY TOPICALLY EVERY DAY 60 g 5   mometasone (ELOCON) 0.1 % cream APPLY TO AFFECTED AREAS  TWICE DAILY AS NEEDED FOR RASH 60 g 1   mupirocin ointment (BACTROBAN) 2 % APPLY A SMALL AMOUNT TO AFFECTED AREA TWICE DAILY 22 g 11   nystatin (MYCOSTATIN/NYSTOP) powder Apply topically 4 (four) times daily. 15 g 4   polyethylene glycol (MIRALAX / GLYCOLAX) packet Take 17 g by mouth as needed.      Probiotic Product (PROBIOTIC & ACIDOPHILUS EX ST PO) Take 2 tablets by mouth at bedtime as needed.     triamcinolone cream (KENALOG) 0.1 %      BYSTOLIC 5 MG tablet TAKE ONE TABLET EVERY DAY 30 tablet 0   DEXILANT 60 MG capsule TAKE 1 CAPSULE BY MOUTH EVERY DAY 90 capsule 0   dicyclomine (BENTYL) 10 MG capsule TAKE 1 CAPSULE BY MOUTH TWICE DAILY 180 capsule 3   gabapentin (NEURONTIN) 300 MG capsule TAKE 1 CAPSULE BY MOUTH TWICE DAILY 180 capsule 3   mirtazapine (REMERON SOL-TAB) 15 MG disintegrating tablet TAKE ONE TABLET BY MOUTH AT BEDTIME 90 tablet 3   traMADol (ULTRAM) 50 MG tablet Take 1 tablet (50 mg total) by mouth every 6 (six) hours as needed. (Patient not taking: Reported on 06/05/2021) 30 tablet 0   valACYclovir (VALTREX) 1000 MG tablet Take 1 tablet (1,000 mg total) by mouth 2 (two) times daily. (Patient not taking: Reported on 06/05/2021) 30 tablet 11   albuterol (VENTOLIN HFA) 108 (90 Base) MCG/ACT inhaler SMARTSIG:2  Puff(s) Via Inhaler Every 8 Hours PRN (Patient not taking: Reported on 06/05/2021)     fluconazole (DIFLUCAN) 100 MG tablet Take 1 tablet (100 mg total) by mouth daily. X 7 days (Patient not taking: Reported on 06/05/2021) 7 tablet 0   nitrofurantoin, macrocrystal-monohydrate, (MACROBID) 100 MG capsule Take 1 capsule (100 mg total) by mouth every 12 (twelve) hours. (Patient not taking: Reported on 06/05/2021) 14 capsule 0   No facility-administered medications prior to visit.    Review of Systems;  Patient denies headache, fevers, malaise, unintentional weight loss, skin rash, eye pain, sinus congestion and sinus pain, sore throat, dysphagia,  hemoptysis , cough, dyspnea,  wheezing, chest pain, palpitations, orthopnea, edema, abdominal pain, nausea, melena, diarrhea, constipation, flank pain, dysuria, hematuria, urinary  Frequency, nocturia, numbness, tingling, seizures,  Focal weakness, Loss of consciousness,  Tremor, insomnia, depression, anxiety, and suicidal ideation.      Objective:  BP 122/68 (BP Location: Left Arm, Patient Position: Sitting, Cuff Size: Normal)    Pulse 62    Ht 5\' 6"  (1.676 m)    Wt 156 lb (70.8 kg)    SpO2 96%    BMI 25.18 kg/m   BP Readings from Last 3 Encounters:  06/05/21 122/68  12/18/20 133/72  07/11/20 126/64    Wt Readings from Last 3 Encounters:  06/05/21 156 lb (70.8 kg)  12/18/20 154 lb (69.9 kg)  07/11/20 145 lb (65.8 kg)    General appearance: alert, cooperative and appears stated age Ears: normal TM's and external ear canals both ears Throat: lips, mucosa, and tongue normal; teeth and gums normal Neck: no adenopathy, no carotid bruit, supple, symmetrical, trachea midline and thyroid not enlarged, symmetric, no tenderness/mass/nodules Back: symmetric, no curvature. ROM normal. No CVA tenderness. Lungs: clear to auscultation bilaterally Heart: regular rate and rhythm, S1, S2 normal, no murmur, click, rub or gallop Abdomen: soft, non-tender; bowel sounds normal; no masses,  no organomegaly Pulses: 2+ and symmetric Skin: Skin color, texture, turgor normal. No rashes or lesions Lymph nodes: Cervical, supraclavicular, and axillary nodes normal.  No results found for: HGBA1C  Lab Results  Component Value Date   CREATININE 0.81 01/01/2021   CREATININE 0.87 02/11/2020   CREATININE 0.80 09/08/2019    Lab Results  Component Value Date   WBC 7.0 01/01/2021   HGB 15.7 01/01/2021   HCT 43.7 01/01/2021   PLT 220 01/01/2021   GLUCOSE 82 01/01/2021   CHOL 161 09/08/2019   TRIG 166.0 (H) 09/08/2019   HDL 44.80 09/08/2019   LDLDIRECT 135.5 01/16/2010   LDLCALC 83 09/08/2019   ALT 27 01/01/2021   AST 23 01/01/2021    NA 137 01/01/2021   K 4.3 01/01/2021   CL 98 01/01/2021   CREATININE 0.81 01/01/2021   BUN 13 01/01/2021   CO2 24 01/01/2021   TSH 1.05 02/10/2020   PSA 2.1 10/19/2012   MICROALBUR 0.7 07/07/2012    No results found.  Assessment & Plan:   Problem List Items Addressed This Visit     Depression, major, single episode, mild (HCC)    Continue mirtazapine.       Relevant Medications   mirtazapine (REMERON SOL-TAB) 15 MG disintegrating tablet   Hyperlipidemia, unspecified - Primary   Relevant Medications   carvedilol (COREG CR) 10 MG 24 hr capsule   carvedilol (COREG CR) 10 MG 24 hr capsule   Other Relevant Orders   Lipid panel   Hypertension    Changing from Bystolic back to Coreg Cr per  patient request      Relevant Medications   carvedilol (COREG CR) 10 MG 24 hr capsule   carvedilol (COREG CR) 10 MG 24 hr capsule   Other Relevant Orders   Comprehensive metabolic panel   Insomnia    Managed with mirtazapine 15 mg qhs       Personal history of bladder cancer    He has had no recurrence after 5 years and continues to see his oncologist at [facility])    Managed with gabapentin 300 mg twice daily       Other Visit Diagnoses     Weight gain       Relevant Orders   Hemoglobin A1c   TSH   Need for pneumococcal vaccination       Relevant Orders   Pneumococcal conjugate vaccine 20-valent (Prevnar 20) (Completed)       I spent 30 minutes dedicated to the care of this patient on the date of this encounter to include pre-visit review of patient's medical history,  most recent imaging studies, Face-to-face time with the patient , and post visit ordering of testing and therapeutics.    Follow-up: No follow-ups on file.   Crecencio Mc, MD

## 2021-06-05 NOTE — Assessment & Plan Note (Signed)
Managed with mirtazapine 15 mg qhs

## 2021-06-05 NOTE — Assessment & Plan Note (Signed)
Changing from Bystolic back to Coreg Cr per patient request

## 2021-06-05 NOTE — Assessment & Plan Note (Signed)
Managed with gabapentin 300 mg twice daily

## 2021-06-05 NOTE — Patient Instructions (Addendum)
Dexilant is controlled release  prevacid  so in a bind,  you can take prevacid 2 times daily   No  cross taper needed for transitioning from bystolic to Coreg  CR

## 2021-06-05 NOTE — Assessment & Plan Note (Signed)
-   Continue mirtazapine 

## 2021-06-06 LAB — LIPID PANEL
Cholesterol: 176 mg/dL (ref 0–200)
HDL: 54.2 mg/dL (ref >39.00)
LDL Cholesterol: 96 mg/dL (ref 0–99)
NonHDL: 121.6
Total CHOL/HDL Ratio: 3
Triglycerides: 129 mg/dL (ref 0.0–149.0)
VLDL: 25.8 mg/dL (ref 0.0–40.0)

## 2021-06-06 LAB — COMPREHENSIVE METABOLIC PANEL
ALT: 29 U/L (ref 0–53)
AST: 28 U/L (ref 0–37)
Albumin: 4.5 g/dL (ref 3.5–5.2)
Alkaline Phosphatase: 52 U/L (ref 39–117)
BUN: 10 mg/dL (ref 6–23)
CO2: 27 mEq/L (ref 19–32)
Calcium: 9.7 mg/dL (ref 8.4–10.5)
Chloride: 104 mEq/L (ref 96–112)
Creatinine, Ser: 0.9 mg/dL (ref 0.40–1.50)
GFR: 86.23 mL/min (ref >60.00)
Glucose, Bld: 92 mg/dL (ref 70–99)
Potassium: 4 mEq/L (ref 3.5–5.1)
Sodium: 142 mEq/L (ref 135–145)
Total Bilirubin: 0.3 mg/dL (ref 0.2–1.2)
Total Protein: 7.7 g/dL (ref 6.0–8.3)

## 2021-06-06 LAB — TSH: TSH: 1.28 u[IU]/mL (ref 0.35–5.50)

## 2021-06-06 LAB — HEMOGLOBIN A1C: Hgb A1c MFr Bld: 5.7 % (ref 4.6–6.5)

## 2021-06-11 ENCOUNTER — Other Ambulatory Visit: Payer: Self-pay | Admitting: Internal Medicine

## 2021-06-11 DIAGNOSIS — R002 Palpitations: Secondary | ICD-10-CM

## 2021-06-11 NOTE — Assessment & Plan Note (Addendum)
Managed ideally with name brand only Coreg CR 10 mg.   Prior authorization is needed ,  The phone number is  [phone]

## 2021-06-12 ENCOUNTER — Telehealth: Payer: Self-pay

## 2021-06-12 NOTE — Telephone Encounter (Signed)
PA for brand name only Coreg CR 10 mg ER capsules has been submitted on covermymeds.

## 2021-06-13 NOTE — Telephone Encounter (Signed)
Brand name Coreg CR 10 mg capsule has been approved through insurance until 04/21/2022.

## 2021-06-15 ENCOUNTER — Other Ambulatory Visit: Payer: Self-pay | Admitting: Internal Medicine

## 2021-06-19 DIAGNOSIS — Z936 Other artificial openings of urinary tract status: Secondary | ICD-10-CM | POA: Diagnosis not present

## 2021-06-19 DIAGNOSIS — Z9889 Other specified postprocedural states: Secondary | ICD-10-CM | POA: Diagnosis not present

## 2021-06-21 ENCOUNTER — Other Ambulatory Visit: Payer: Self-pay | Admitting: Internal Medicine

## 2021-06-21 DIAGNOSIS — J0100 Acute maxillary sinusitis, unspecified: Secondary | ICD-10-CM | POA: Insufficient documentation

## 2021-06-21 DIAGNOSIS — J019 Acute sinusitis, unspecified: Secondary | ICD-10-CM | POA: Insufficient documentation

## 2021-06-21 DIAGNOSIS — J0191 Acute recurrent sinusitis, unspecified: Secondary | ICD-10-CM | POA: Insufficient documentation

## 2021-06-21 MED ORDER — PREDNISONE 10 MG PO TABS: ORAL_TABLET | ORAL | 0 refills | Status: DC

## 2021-06-21 MED ORDER — AMOXICILLIN-POT CLAVULANATE 875-125 MG PO TABS: 1.0000 | ORAL_TABLET | Freq: Two times a day (BID) | ORAL | 0 refills | Status: DC

## 2021-06-21 MED ORDER — PROMETHAZINE-PHENYLEPHRINE 6.25-5 MG/5ML PO SYRP: 5.0000 mL | ORAL_SOLUTION | Freq: Three times a day (TID) | ORAL | 0 refills | Status: DC | PRN

## 2021-06-22 ENCOUNTER — Other Ambulatory Visit: Payer: Self-pay | Admitting: Internal Medicine

## 2021-06-22 ENCOUNTER — Telehealth: Payer: Self-pay | Admitting: Internal Medicine

## 2021-06-22 MED ORDER — PROMETHAZINE-PHENYLEPHRINE 6.25-5 MG/5ML PO SYRP: 5.0000 mL | ORAL_SOLUTION | Freq: Three times a day (TID) | ORAL | 0 refills | Status: DC | PRN

## 2021-06-22 MED ORDER — HYDROCOD POLI-CHLORPHE POLI ER 10-8 MG/5ML PO SUER: 5.0000 mL | Freq: Two times a day (BID) | ORAL | 0 refills | Status: DC | PRN

## 2021-06-22 NOTE — Telephone Encounter (Signed)
Spoke with pt and he asked if you could just send in Tussinex to [facility] pharmacy. He stated that he has taken that before.  ?

## 2021-06-22 NOTE — Telephone Encounter (Signed)
Patient has an allergy to codeine, can he tolerate hydrocodone ?  (Has he ever taken vicodin for pain? It's the active ingredient in Tussionex) ?

## 2021-06-22 NOTE — Telephone Encounter (Signed)
[facility] said that they do not have promethazine-phenylephrine. They want to know is there an alternative that he can get ?

## 2021-06-22 NOTE — Telephone Encounter (Signed)
Pt would like to try sending it to [facility] on [address].  ?

## 2021-06-26 DIAGNOSIS — R194 Change in bowel habit: Secondary | ICD-10-CM | POA: Diagnosis not present

## 2021-06-26 DIAGNOSIS — Z800 Family history of malignant neoplasm of digestive organs: Secondary | ICD-10-CM | POA: Diagnosis not present

## 2021-06-26 DIAGNOSIS — Z1211 Encounter for screening for malignant neoplasm of colon: Secondary | ICD-10-CM | POA: Diagnosis not present

## 2021-06-27 ENCOUNTER — Ambulatory Visit: Payer: Medicare PPO | Admitting: Internal Medicine

## 2021-06-27 ENCOUNTER — Other Ambulatory Visit: Payer: Self-pay | Admitting: General Surgery

## 2021-06-27 NOTE — Progress Notes (Signed)
Subjective:  ?  ? Patient ID: Tony Cox is a 71 y.o. male. ?  ?HPI ?  ?The following portions of the patient's history were reviewed and updated as appropriate. ?  ?This an established patient is here today for: office visit. He states his bowels have change shape, pencil size. No bleeding or abdominal pain. ?Bowels move 3 times a day, with the use of MiraLAX. ?  ?Review of Systems  ?Constitutional: Negative for chills and fever.  ?Respiratory: Negative for cough.   ?Gastrointestinal: Negative for anal bleeding and blood in stool.  ?  ?  ?   ?Chief Complaint  ?Patient presents with  ? Follow-up  ?  ?  ?BP 110/60   Pulse 75   Temp 36.6 ?C (97.9 ?F)   Ht 167.6 cm (5' 6" )   Wt 70.3 kg (155 lb)   SpO2 95%   BMI 25.02 kg/m?  ?  ?    ?Past Medical History:  ?Diagnosis Date  ? Bladder cancer (CMS-HCC) 2015  ? Cardiac murmur    ? Depression    ? GERD (gastroesophageal reflux disease)    ? 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 History of atrial fibrillation    ? History of mitral valve repair    ? Hyperlipidemia    ? Hypertension    ?  ?  ?     ?Past Surgical History:  ?Procedure Laterality Date  ? REPAIR MITRAL VALVE   1999  ? CARDIAC VALVE REPLACEMENT   2004  ? COLONOSCOPY   2009  ? transthoracic echocardiogram   11/03/2008  ? UMBILICAL HERNIA REPAIR   2011  ? CHOLECYSTECTOMY   2012  ? COLONOSCOPY   2013  ? CYSTECTOMY   2014  ?  ileal conduit diversion  ? TRANSURETHRAL RESECTION OF BLADDER   07/27/2012  ? CATARACT EXTRACTION Left 11/20/2015  ? COLONOSCOPY   07/24/2016  ?  Follow up colonoscopy in April 2023. Family history of colon cancer.  ? urethroptomy   11/05/2016  ? TONSILLECTOMY      ?  as a child  ?  ?  ?  ?  ?Social History  ?  ?  ?     ?Socioeconomic History  ? Marital status: Single  ?Tobacco Use  ? Smoking status: Former  ?    Packs/day: 0.25  ?    Years: 10.00  ?    Pack years: 2.50  ?    Types: Cigarettes  ?    Quit date: 04/22/1998  ?    Years since quitting: 23.1   ? Smokeless tobacco: Never  ? Tobacco comments:  ?    6 months  ?Substance and Sexual Activity  ? Alcohol use: Not Currently  ?  ?  ?  ?     ?Allergies  ?Allergen Reactions  ? Codeine Nausea and Other (See Comments)  ?    Other Reaction: RASH AND ITICHING  ?  ?  ?Current Medications  ?      ?Current Outpatient Medications  ?Medication Sig Dispense Refill  ? carvedilol (COREG CR) 10 MG CR capsule Take 1 capsule (10 mg total) by mouth once daily 30 capsule 11  ? dexlansoprazole (DEXILANT) 60 mg DR capsule Take by mouth Take 1 capsule (60 mg total) by mouth daily.      ? dicyclomine (BENTYL) 10 mg capsule        ? gabapentin (NEURONTIN) 300 MG capsule        ?  meloxicam (MOBIC) 15 MG tablet Take 15 mg by mouth as needed TAKE ONE TABLET BY MOUTH EVERY DAY      ? mirtazapine (REMERON SOLTAB) 15 MG disintegrating tablet TAKE ONE TABLET AT BEDTIME 90 tablet 1  ? valACYclovir (VALTREX) 500 MG tablet Take 1 tablet by mouth as needed      ? BYSTOLIC 5 mg tablet TAKE ONE TABLET (5 MG) BY MOUTH EVERY DAY (Patient not taking: Reported on 06/19/2021) 30 tablet 11  ? clarithromycin (BIAXIN) 500 MG tablet  (Patient not taking: Reported on 06/26/2021)      ? clindamycin (CLEOCIN) 300 MG capsule Take 300 mg by mouth 3 (three) times daily. (Patient not taking: Reported on 06/26/2021)      ? clindamycin phosphate foam Apply 1 Application topically daily.  (Patient not taking: Reported on 06/26/2021)      ? nitrofurantoin, macrocrystal-monohydrate, (MACROBID) 100 MG capsule Take by mouth Take 1 capsule (100 mg total) by mouth every 12 (twelve) hours. (Patient not taking: Reported on 06/26/2021)      ?  ?No current facility-administered medications for this visit.  ?  ?  ?  ?     ?Family History  ?Adopted: Yes  ?Problem Relation Age of Onset  ? Colon cancer Mother    ? Kidney cancer Mother    ? Squamous cell carcinoma Father    ?      Of the mouth  ?  ?  ?  ?Labs and Radiology:  ?  ?Negative colonoscopy April 2018. ?  ?This was recorded as a  technically difficult study due to restricted mobility of the colon.  It was successfully completed by placing the patient in the supine position ?  ?  ?January 01, 2021 laboratory: ?  ?  ?WBC 3.4 - 10.8 x10E3/uL 7.0   ?RBC 4.14 - 5.80 x10E6/uL 5.01   ?Hemoglobin 13.0 - 17.7 g/dL 15.7   ?Hematocrit 37.5 - 51.0 % 43.7   ?MCV 79 - 97 fL 87   ?MCH 26.6 - 33.0 pg 31.3   ?MCHC 31.5 - 35.7 g/dL 35.9 High    ?RDW 11.6 - 15.4 % 13.1   ?Platelets 150 - 450 x10E3/uL 220   ?Neutrophils Not Estab. % 64   ?Lymphs Not Estab. % 25   ?Monocytes Not Estab. % 9   ?Eos Not Estab. % 1   ?Basos Not Estab. % 1   ?Neutrophils Absolute 1.4 - 7.0 x10E3/uL 4.5   ?Lymphocytes Absolute 0.7 - 3.1 x10E3/uL 1.7   ?Monocytes Absolute 0.1 - 0.9 x10E3/uL 0.6   ?  ?Glucose 65 - 99 mg/dL 82   ?BUN 8 - 27 mg/dL 13   ?Creatinine, Ser 0.76 - 1.27 mg/dL 0.81   ?eGFR >59 mL/min/1.73 95   ?BUN/Creatinine Ratio 10 - 24 16   ?Sodium 134 - 144 mmol/L 137   ?Potassium 3.5 - 5.2 mmol/L 4.3   ?Chloride 96 - 106 mmol/L 98   ?CO2 20 - 29 mmol/L 24   ?Calcium 8.6 - 10.2 mg/dL 9.5   ?Total Protein 6.0 - 8.5 g/dL 7.2   ?Albumin 3.8 - 4.8 g/dL 4.9 High    ?Globulin, Total 1.5 - 4.5 g/dL 2.3   ?Albumin/Globulin Ratio 1.2 - 2.2 2.1   ?Bilirubin Total 0.0 - 1.2 mg/dL 0.4   ?Alkaline Phosphatase 44 - 121 IU/L 67   ?AST 0 - 40 IU/L 23   ?ALT 0 - 44 IU/L 27   ?  ?The patient reports that CT scan  completed at [facility] last year was negative for recurrent disease. ?  ?   ?Objective:  ? Physical Exam ?Constitutional:   ?   Appearance: Normal appearance.  ?Cardiovascular:  ?   Rate and Rhythm: Normal rate and regular rhythm.  ?   Pulses: Normal pulses.  ?   Heart sounds: Normal heart sounds.  ?Pulmonary:  ?   Effort: Pulmonary effort is normal.  ?   Breath sounds: Normal breath sounds.  ?Abdominal:  ?   Tenderness: There is no abdominal tenderness.  ? ? ?   Comments: Parastomal hernia around the ureterostomy.  Nontender.  ?Musculoskeletal:  ?   Cervical back: Neck  supple.  ?Skin: ?   General: Skin is warm and dry.  ?Neurological:  ?   Mental Status: He is alert and oriented to person, place, and time.  ?Psychiatric:     ?   Mood and Affect: Mood normal.     ?   Behavior: Behavior normal.  ?  ?  ?  ?   ?Assessment:  ?   ?Candidate for repeat colonoscopy based on family history. ?   ?Plan:  ?   ?The procedure was reviewed with the staff. ?   ?  ?This note is partially prepared by Karie Fetch, RN, acting as a scribe in the presence of Dr. Hervey Ard, MD.  ?The documentation recorded by the scribe accurately reflects the service I personally performed and the decisions made by me.  ?  ?Robert Bellow, MD FACS ? ?

## 2021-07-03 ENCOUNTER — Encounter: Payer: Self-pay | Admitting: General Surgery

## 2021-07-04 ENCOUNTER — Encounter
Admission: RE | Disposition: A | Discharge status: Home / Self Care | Payer: Self-pay | Source: Ambulatory Visit | Attending: General Surgery

## 2021-07-04 ENCOUNTER — Ambulatory Visit
Admission: RE | Admit: 2021-07-04 | Discharge: 2021-07-04 | Disposition: A | Discharge status: Home / Self Care | Payer: Medicare PPO | Source: Ambulatory Visit | Attending: General Surgery | Admitting: General Surgery

## 2021-07-04 ENCOUNTER — Ambulatory Visit: Payer: Medicare PPO | Admitting: Certified Registered Nurse Anesthetist

## 2021-07-04 ENCOUNTER — Encounter: Payer: Self-pay | Admitting: General Surgery

## 2021-07-04 DIAGNOSIS — Z952 Presence of prosthetic heart valve: Secondary | ICD-10-CM | POA: Insufficient documentation

## 2021-07-04 DIAGNOSIS — Z79899 Other long term (current) drug therapy: Secondary | ICD-10-CM | POA: Diagnosis not present

## 2021-07-04 DIAGNOSIS — Z800 Family history of malignant neoplasm of digestive organs: Secondary | ICD-10-CM | POA: Diagnosis not present

## 2021-07-04 DIAGNOSIS — K5730 Diverticulosis of large intestine without perforation or abscess without bleeding: Secondary | ICD-10-CM | POA: Insufficient documentation

## 2021-07-04 DIAGNOSIS — Z1211 Encounter for screening for malignant neoplasm of colon: Secondary | ICD-10-CM | POA: Diagnosis not present

## 2021-07-04 DIAGNOSIS — F32A Depression, unspecified: Secondary | ICD-10-CM | POA: Diagnosis not present

## 2021-07-04 DIAGNOSIS — Z8551 Personal history of malignant neoplasm of bladder: Secondary | ICD-10-CM | POA: Insufficient documentation

## 2021-07-04 DIAGNOSIS — K219 Gastro-esophageal reflux disease without esophagitis: Secondary | ICD-10-CM | POA: Diagnosis not present

## 2021-07-04 DIAGNOSIS — I4891 Unspecified atrial fibrillation: Secondary | ICD-10-CM | POA: Diagnosis not present

## 2021-07-04 DIAGNOSIS — K635 Polyp of colon: Secondary | ICD-10-CM | POA: Diagnosis not present

## 2021-07-04 DIAGNOSIS — I10 Essential (primary) hypertension: Secondary | ICD-10-CM | POA: Diagnosis not present

## 2021-07-04 DIAGNOSIS — D126 Benign neoplasm of colon, unspecified: Secondary | ICD-10-CM | POA: Diagnosis not present

## 2021-07-04 DIAGNOSIS — Z87891 Personal history of nicotine dependence: Secondary | ICD-10-CM | POA: Diagnosis not present

## 2021-07-04 DIAGNOSIS — D123 Benign neoplasm of transverse colon: Secondary | ICD-10-CM | POA: Diagnosis not present

## 2021-07-04 HISTORY — DX: Myoneural disorder, unspecified: G70.9

## 2021-07-04 HISTORY — DX: Unspecified atrial fibrillation: I48.91

## 2021-07-04 HISTORY — PX: COLONOSCOPY WITH PROPOFOL: SHX5780

## 2021-07-04 HISTORY — DX: Cardiac arrhythmia, unspecified: I49.9

## 2021-07-04 SURGERY — COLONOSCOPY WITH PROPOFOL
Anesthesia: General

## 2021-07-04 MED ORDER — EPHEDRINE 5 MG/ML INJ
INTRAVENOUS | Status: AC
  Filled 2021-07-04: qty 5

## 2021-07-04 MED ORDER — EPHEDRINE SULFATE (PRESSORS) 50 MG/ML IJ SOLN
INTRAMUSCULAR | Status: DC | PRN
  Administered 2021-07-04: 5 mg via INTRAVENOUS

## 2021-07-04 MED ORDER — PROPOFOL 10 MG/ML IV BOLUS
INTRAVENOUS | Status: DC | PRN
  Administered 2021-07-04: 30 mg via INTRAVENOUS
  Administered 2021-07-04: 70 mg via INTRAVENOUS

## 2021-07-04 MED ORDER — SODIUM CHLORIDE 0.9 % IV SOLN
INTRAVENOUS | Status: DC
Start: 2021-07-04 — End: 2021-07-04
  Administered 2021-07-04: 20 mL/h via INTRAVENOUS

## 2021-07-04 MED ORDER — PROPOFOL 500 MG/50ML IV EMUL
INTRAVENOUS | Status: DC | PRN
  Administered 2021-07-04: 160 ug/kg/min via INTRAVENOUS

## 2021-07-04 MED ORDER — PROPOFOL 500 MG/50ML IV EMUL
INTRAVENOUS | Status: AC
  Filled 2021-07-04: qty 200

## 2021-07-04 MED ORDER — LIDOCAINE HCL (PF) 2 % IJ SOLN
INTRAMUSCULAR | Status: AC
  Filled 2021-07-04: qty 5

## 2021-07-04 NOTE — Op Note (Signed)
[facility] ?Gastroenterology ?Patient Name: Tony Cox ?Procedure Date: 07/04/2021 7:21 AM ?MRN: [id number] ?Account #: [id number] ?Date of Birth: 08/29/1950 ?Admit Type: Outpatient ?Age: 71 ?Room: [facility] ENDO ROOM 1 ?Gender: Male ?Note Status: Finalized ?Instrument Name: Peds Colonoscope [id number] ?Procedure:             Colonoscopy ?Indications:           Screening patient at increased risk: Family history of  ?                       1st-degree relative with colorectal cancer at age 63  ?                       years (or older) ?Providers:             Robert Bellow, MD ?Referring MD:          Deborra Medina, MD (Referring MD) ?Medicines:             Propofol per Anesthesia ?Complications:         No immediate complications. ?Procedure:             Pre-Anesthesia Assessment: ?                       - Prior to the procedure, a History and Physical was  ?                       performed, and patient medications, allergies and  ?                       sensitivities were reviewed. The patient's tolerance  ?                       of previous anesthesia was reviewed. ?                       - The risks and benefits of the procedure and the  ?                       sedation options and risks were discussed with the  ?                       patient. All questions were answered and informed  ?                       consent was obtained. ?                       After obtaining informed consent, the colonoscope was  ?                       passed under direct vision. Throughout the procedure,  ?                       the patient's blood pressure, pulse, and oxygen  ?                       saturations were monitored continuously. The  ?  Colonoscope was introduced through the anus and  ?                       advanced to the the terminal ileum. The colonoscopy  ?                       was performed with moderate difficulty due to multiple  ?                       diverticula in the  colon and restricted mobility of  ?                       the colon. This was in the sigmoid area. No evidence  ?                       colon was in his para=stomal hernia based on light not  ?                       visualized in the hernia. Successful completion of the  ?                       procedure was aided by changing the patient to a prone  ?                       position. The patient tolerated the procedure well.  ?                       The quality of the bowel preparation was excellent. ?Findings: ?     Multiple small-mouthed diverticula were found in the sigmoid colon. ?     A 5 mm polyp was found in the transverse colon. The polyp was sessile.  ?     Biopsy was taken with a cold forceps for histology. Completely removed. ?     The retroflexed view of the distal rectum and anal verge was normal and  ?     showed no anal or rectal abnormalities. ?Impression:            - Diverticulosis in the sigmoid colon. ?                       - One 5 mm polyp in the transverse colon. Biopsied. ?                       - The distal rectum and anal verge are normal on  ?                       retroflexion view. ?Recommendation:        - Telephone endoscopist for pathology results in 1  ?                       week. ?Procedure Code(s):     --- Professional --- ?                       [phone], Colonoscopy, flexible; with biopsy, single or  ?                       multiple ?Diagnosis Code(s):     ---  Professional --- ?                       Z80.0, Family history of malignant neoplasm of  ?                       digestive organs ?                       K63.5, Polyp of colon ?                       K57.30, Diverticulosis of large intestine without  ?                       perforation or abscess without bleeding ?CPT copyright 2019 American Medical Association. All rights reserved. ?The codes documented in this report are preliminary and upon coder review may  ?be revised to meet current compliance requirements. ?Robert Bellow, MD ?07/04/2021 8:14:51 AM ?This report has been signed electronically. ?Number of Addenda: 0 ?Note Initiated On: 07/04/2021 7:21 AM ?Scope Withdrawal Time: 0 hours 7 minutes 36 seconds  ?Total Procedure Duration: 0 hours 26 minutes 58 seconds  ?Estimated Blood Loss:  Estimated blood loss: none. ?     [facility] ?

## 2021-07-04 NOTE — Anesthesia Preprocedure Evaluation (Addendum)
Anesthesia Evaluation  ?Patient identified by MRN, date of birth, ID band ?Patient awake ? ? ? ?Reviewed: ?Allergy & Precautions, NPO status , Patient's Chart, lab work & pertinent test results ? ?History of Anesthesia Complications ?Negative for: history of anesthetic complications ? ?Airway ?Mallampati: II ? ?TM Distance: >3 FB ?Neck ROM: Full ? ? ? Dental ?no notable dental hx. ?(+) Teeth Intact ?  ?Pulmonary ?neg sleep apnea, neg COPD, Patient abstained from smoking.Not current smoker, former smoker,  ?  ?Pulmonary exam normal ?breath sounds clear to auscultation ? ? ? ? ? ? Cardiovascular ?Exercise Tolerance: Good ?METShypertension, (-) CAD and (-) Past MI (-) dysrhythmias + Valvular Problems/Murmurs MR  ?Rhythm:Regular Rate:Normal ?- Systolic murmurs ?TTE 2022 ?INTERPRETATION  ?NORMAL LEFT VENTRICULAR SYSTOLIC FUNCTION  ?NORMAL RIGHT VENTRICULAR SYSTOLIC FUNCTION  ?MILD VALVULAR REGURGITATION (See above)  ?NO VALVULAR STENOSIS  ?s/p MITRAL VALVE REPAIR  ?MVS: PROSTHETIC MV RING  ?Closest EF: >55% (Estimated)  ?No mitral stenosis or significant mr  ?Mitral: MILD MR  ? ?  ?Neuro/Psych ?PSYCHIATRIC DISORDERS Depression negative neurological ROS ?   ? GI/Hepatic ?GERD  ,(+)  ?  ? (-) substance abuse ? ,   ?Endo/Other  ?neg diabetes ? Renal/GU ?negative Renal ROS  ? ?  ?Musculoskeletal ? ? Abdominal ?  ?Peds ? Hematology ?  ?Anesthesia Other Findings ?Past Medical History: ?No date: Atrial fibrillation ([place]) ?2015: Cancer ([place]) ?    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    Comment:  POST OP MITRIAL VALVE REPLACEMENT IN 2004 ?CARDIOLOGIST-- DR FATH--  LOV NOTE W/ CHART 10-30-2011: History of  ?mitral valve repair ?    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 Reproductive/Obstetrics ? ?  ? ? ? ? ? ? ? ? ? ? ? ? ? ?  ?  ? ? ? ? ? ? ? ?Anesthesia Physical ?Anesthesia Plan ? ?ASA: 2 ? ?Anesthesia Plan: General  ? ?Post-op Pain Management: Minimal or no pain anticipated  ? ?Induction: Intravenous ? ?PONV Risk Score and Plan: 2 and Propofol infusion, TIVA and Ondansetron ? ?Airway Management Planned: Nasal Cannula ? ?Additional Equipment: None ? ?Intra-op Plan:  ? ?Post-operative Plan:  ? ?Informed Consent: I have reviewed the patients History and Physical, chart, labs and discussed the procedure including the risks, benefits and alternatives for the proposed anesthesia with the patient or authorized representative who has indicated his/her understanding and acceptance.  ? ? ? ?Dental advisory given ? ?Plan Discussed with: CRNA and Surgeon ? ?Anesthesia Plan Comments: (Discussed risks of anesthesia with patient, including possibility of difficulty with spontaneous ventilation under anesthesia necessitating airway intervention, PONV, and rare risks such as cardiac or respiratory or neurological events, and allergic reactions. Discussed the role of CRNA in patient's perioperative care. Patient understands.)  ? ? ? ? ? ? ?Anesthesia Quick Evaluation ? ?

## 2021-07-04 NOTE — Anesthesia Procedure Notes (Signed)
Date/Time: 07/04/2021 7:48 AM ?Performed by: Demetrius Charity, CRNA ?Pre-anesthesia Checklist: Patient identified, Emergency Drugs available, Suction available, Patient being monitored and Timeout performed ?Patient Re-evaluated:Patient Re-evaluated prior to induction ?Oxygen Delivery Method: Nasal cannula ?Induction Type: IV induction ?Placement Confirmation: CO2 detector and positive ETCO2 ? ? ? ? ?

## 2021-07-04 NOTE — Anesthesia Postprocedure Evaluation (Signed)
Anesthesia Post Note ? ?Patient: Tony Cox ? ?Procedure(s) Performed: COLONOSCOPY WITH PROPOFOL ? ?Patient location during evaluation: Endoscopy ?Anesthesia Type: General ?Level of consciousness: awake and alert ?Pain management: pain level controlled ?Vital Signs Assessment: post-procedure vital signs reviewed and stable ?Respiratory status: spontaneous breathing, nonlabored ventilation, respiratory function stable and patient connected to nasal cannula oxygen ?Cardiovascular status: blood pressure returned to baseline and stable ?Postop Assessment: no apparent nausea or vomiting ?Anesthetic complications: no ? ? ?No notable events documented. ? ? ?Last Vitals:  ?Vitals:  ? 07/04/21 0830 07/04/21 0840  ?BP: 122/77 133/77  ?Pulse: 79   ?Resp: 18 16  ?Temp:    ?SpO2: 100% 100%  ?  ?Last Pain:  ?Vitals:  ? 07/04/21 0810  ?TempSrc: Temporal  ?PainSc:   ? ? ?  ?  ?  ?  ?  ?  ? ?Arita Miss ? ? ? ? ?

## 2021-07-04 NOTE — Transfer of Care (Signed)
Immediate Anesthesia Transfer of Care Note ? ?Patient: Tony Cox ? ?Procedure(s) Performed: COLONOSCOPY WITH PROPOFOL ? ?Patient Location: PACU ? ?Anesthesia Type:General ? ?Level of Consciousness: drowsy ? ?Airway & Oxygen Therapy: Patient Spontanous Breathing and Patient connected to face mask oxygen ? ?Post-op Assessment: Report given to RN and Post -op Vital signs reviewed and stable ? ?Post vital signs: Reviewed and stable ? ?Last Vitals:  ?Vitals Value Taken Time  ?BP 95/52 07/04/21 0814  ?Temp 35.9 ?C 07/04/21 0810  ?Pulse 67 07/04/21 0814  ?Resp 16 07/04/21 0814  ?SpO2 98 % 07/04/21 0814  ?Vitals shown include unvalidated device data. ? ?Last Pain:  ?Vitals:  ? 07/04/21 0810  ?TempSrc: Temporal  ?PainSc:   ?   ? ?  ? ?Complications: No notable events documented. ?

## 2021-07-04 NOTE — H&P (Signed)
Tony Cox ?[id number] ?Mar 14, 1951 ? ?  ? ?HPI:  71 y/o male with family history (mother) of colon cancer. For screening.  ? ?Medications Prior to Admission  ?Medication Sig Dispense Refill Last Dose  ? Clindamycin Phosphate foam APPLY 1 GRAM ON SKIN DAILY  5 Past Week  ? desonide (DESOWEN) 0.05 % lotion    Past Week  ? DEXILANT 60 MG capsule Take 1 capsule (60 mg total) by mouth daily. 90 capsule 1 Past Week  ? dicyclomine (BENTYL) 10 MG capsule Take 1 capsule (10 mg total) by mouth 2 (two) times daily. 180 capsule 1 07/03/2021  ? doxylamine, Sleep, (UNISOM SLEEPTABS) 25 MG tablet Take by mouth.   Past Week  ? gabapentin (NEURONTIN) 300 MG capsule TAKE 1 CAPSULE BY MOUTH TWICE DAILY 180 capsule 1 Past Week  ? hydrocortisone-pramoxine (ANALPRAM-HC) 2.5-1 % rectal cream APPLY A SMALL AMOUNT TWICE  A DAY AS NEEDED 30 g 0 Past Week  ? meloxicam (MOBIC) 15 MG tablet Take 1 tablet (15 mg total) by mouth daily. 30 tablet 5 Past Week  ? metroNIDAZOLE (METROGEL) 1 % gel APLLY TOPICALLY EVERY DAY 60 g 5 Past Week  ? mirtazapine (REMERON SOL-TAB) 15 MG disintegrating tablet Take 1 tablet (15 mg total) by mouth at bedtime. 90 tablet 1 Past Week  ? mometasone (ELOCON) 0.1 % cream APPLY TO AFFECTED AREAS TWICE DAILY AS NEEDED FOR RASH 60 g 1 Past Week  ? mupirocin ointment (BACTROBAN) 2 % APPLY A SMALL AMOUNT TO AFFECTED AREA TWICE DAILY 22 g 11 Past Week  ? nystatin (MYCOSTATIN/NYSTOP) powder Apply topically 4 (four) times daily. 15 g 4 Past Week  ? polyethylene glycol (MIRALAX / GLYCOLAX) packet Take 17 g by mouth as needed.    Past Week  ? predniSONE (DELTASONE) 10 MG tablet 6 tablets on Day 1 , then reduce by 1 tablet daily until gone 21 tablet 0 Past Week  ? Probiotic Product (PROBIOTIC & ACIDOPHILUS EX ST PO) Take 2 tablets by mouth at bedtime as needed.   07/03/2021  ? triamcinolone cream (KENALOG) 0.1 %    Past Week  ? amoxicillin-clavulanate (AUGMENTIN) 875-125 MG tablet Take 1 tablet by mouth 2 (two) times daily. 14  tablet 0   ? chlorpheniramine-HYDROcodone (TUSSIONEX PENNKINETIC ER) 10-8 MG/5ML Take 5 mLs by mouth every 12 (twelve) hours as needed for cough. 115 mL 0   ? traMADol (ULTRAM) 50 MG tablet Take 1 tablet (50 mg total) by mouth every 6 (six) hours as needed. (Patient not taking: Reported on 06/05/2021) 30 tablet 0   ? valACYclovir (VALTREX) 1000 MG tablet Take 1 tablet (1,000 mg total) by mouth 2 (two) times daily. (Patient not taking: Reported on 06/05/2021) 30 tablet 11   ? ?Allergies  ?Allergen Reactions  ? Codeine Nausea And Vomiting and Hives  ? ?Past Medical History:  ?Diagnosis Date  ? Atrial fibrillation ([place])   ? Cancer [facility]) 2015  ? T2 uroepithelial bladder carcinoma   ? Depression   ? Dysrhythmia   ? GERD (gastroesophageal reflux disease)   ? Heart murmur   ? 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 History of atrial fibrillation without current medication   ? POST OP MITRIAL VALVE REPLACEMENT IN 2004  ? History of mitral valve repair CARDIOLOGIST-- DR FATH--  LOV NOTE W/ CHART 10-30-2011  ? 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 Hyperlipidemia   ? Hypertension   ? ?Past Surgical  History:  ?Procedure Laterality Date  ? CARDIAC CATHETERIZATION    ? CATARACT EXTRACTION W/PHACO Left 11/20/2015  ? Procedure: CATARACT EXTRACTION PHACO AND INTRAOCULAR LENS PLACEMENT (IOC);  Surgeon: Birder Robson, MD;  Location: [facility] ORS;  Service: Ophthalmology;  Laterality: Left;  [place] ?AP% ?CDE ?fluid pack lot # [id number]  ? CHOLECYSTECTOMY  2011  ? COLONOSCOPY  2009, 2013  ? Dr Vassie Moment ADENOMA   ? COLONOSCOPY WITH PROPOFOL N/A 07/24/2016  ? Procedure: COLONOSCOPY WITH PROPOFOL;  Surgeon: Robert Bellow, MD;  Location: [facility] ENDOSCOPY;  Service: Endoscopy;  Laterality: N/A;  ? cystectomy  2014  ? ileal conduit diversion   ? HERNIA REPAIR  6503  ? umbilical   ? MITRAL VALVE REPLACEMENT    ? TONSILLECTOMY  AS CHILD  ? TRANSTHORACIC ECHOCARDIOGRAM  11-03-2008  DR  Ubaldo Glassing [name])  ? LVF NORMAL/ EF 55-60%/ LEFT ATRIAL ENLARGEMENT/  ADEQUATELY FUNCTIONING MITRAL VALVE REPAIR/  MILD MR & TRI/  NO SIGNIFICANT CHANGE FROM PREVIOUS ECHO  ? TRANSURETHRAL RESECTION OF BLADDER TUMOR N/A 07/27/2012  ? Procedure: TRANSURETHRAL RESECTION OF BLADDER TUMOR (TURBT);  Surgeon: Franchot Gallo, MD;  Location: [facility];  Service: Urology;  Laterality: N/A;  WITH MITOMYCIN INSTILLATION ?  ? urethroptomy  11/05/2016  ? [place]  ? valvuloplasty with replacement of cosgrove ring,1 congenital ruptur of cordi  2004   [place]  ? mitral valve   ? ?Social History  ? ?Socioeconomic History  ? Marital status: Single  ?  Spouse name: Not on file  ? Number of children: Not on file  ? Years of education: Not on file  ? Highest education level: Not on file  ?Occupational History  ? Not on file  ?Tobacco Use  ? Smoking status: Former  ?  Types: Cigarettes  ?  Quit date: 04/22/2012  ?  Years since quitting: 9.2  ? Smokeless tobacco: Never  ?Vaping Use  ? Vaping Use: Never used  ?Substance and Sexual Activity  ? Alcohol use: Yes  ?  Alcohol/week: 1.0 standard drink  ?  Types: 1 Standard drinks or equivalent per week  ?  Comment: occasional  ? Drug use: No  ? Sexual activity: Not on file  ?Other Topics Concern  ? Not on file  ?Social History Narrative  ? High school Investment banker, corporate   ? ?Social Determinants of Health  ? ?Financial Resource Strain: Not on file  ?Food Insecurity: Not on file  ?Transportation Needs: Not on file  ?Physical Activity: Not on file  ?Stress: Not on file  ?Social Connections: Not on file  ?Intimate Partner Violence: Not on file  ? ?Social History  ? ?Social History Narrative  ? High school Investment banker, corporate   ? ? ? ?ROS: Negative.  ? ? ? ?PE: ?HEENT: Negative. ?Lungs: Clear. ?Cardio: RR. ? ?Assessment/Plan: ? ?Proceed with planned endoscopy.  ? ?Robert Bellow ?07/04/2021 ? ?  ?

## 2021-07-05 LAB — SURGICAL PATHOLOGY

## 2021-07-06 ENCOUNTER — Encounter: Payer: Self-pay | Admitting: General Surgery

## 2021-07-30 ENCOUNTER — Ambulatory Visit: Payer: Medicare PPO | Admitting: Dermatology

## 2021-07-31 ENCOUNTER — Telehealth (INDEPENDENT_AMBULATORY_CARE_PROVIDER_SITE_OTHER): Payer: Medicare PPO | Admitting: Internal Medicine

## 2021-07-31 ENCOUNTER — Other Ambulatory Visit: Payer: Self-pay | Admitting: Internal Medicine

## 2021-07-31 DIAGNOSIS — J0101 Acute recurrent maxillary sinusitis: Secondary | ICD-10-CM | POA: Diagnosis not present

## 2021-07-31 MED ORDER — CEFDINIR 300 MG PO CAPS: 300.0000 mg | ORAL_CAPSULE | Freq: Two times a day (BID) | ORAL | 0 refills | Status: DC

## 2021-07-31 MED ORDER — PREDNISONE 10 MG PO TABS: ORAL_TABLET | ORAL | 0 refills | Status: DC

## 2021-07-31 NOTE — Progress Notes (Signed)
Virtual Visit via Caregility  Note ? ?This visit type was conducted due to national recommendations for restrictions regarding the COVID-19 pandemic (e.g. social distancing).  This format is felt to be most appropriate for this patient at this time.  All issues noted in this document were discussed and addressed.  No physical exam was performed (except for noted visual exam findings with Video Visits).  ? ?I connected withNAME@ on 07/31/21 at 10:45 AM EDT by a video enabled telemedicine application  and verified that I am speaking with the correct person using two identifiers. ?Location patient: home ?Location provider: work or home office ?Persons participating in the virtual visit: patient, provider ? ?I discussed the limitations, risks, security and privacy concerns of performing an evaluation and management service by telephone and the availability of in person appointments. I also discussed with the patient that there may be a patient responsible charge related to this service. The patient expressed understanding and agreed to proceed. ? ?Reason for visit: persistent sinus pain and infection  ? ?HPI: ? ?Treated on March 2 for maxillary sinusitis with augmentin and prednisone. Patient did not tolerated augmentin due to GI Upset;  took 7 days of cefdinir instead.  Symptoms improved but did not completely resolve.   Taking zyrtec and nettie pott each night for allergies.   ?Currently symptoms  became more severe after flying to [place]  on April 7   woke up on April 8 with dull headache fatigue,  congestion  eye pain facial pain .    COVID NEGATIVE  ? ? ?ROS: See pertinent positives and negatives per HPI. ? ?Past Medical History:  ?Diagnosis Date  ? Atrial fibrillation ([place])   ? Cancer [facility]) 2015  ? T2 uroepithelial bladder carcinoma   ? Depression   ? Dysrhythmia   ? GERD (gastroesophageal reflux disease)   ? Heart murmur   ? 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 History of atrial  fibrillation without current medication   ? POST OP MITRIAL VALVE REPLACEMENT IN 2004  ? History of mitral valve repair CARDIOLOGIST-- DR FATH--  LOV NOTE W/ CHART 10-30-2011  ? 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 Hyperlipidemia   ? Hypertension   ? Neuromuscular disorder ([place])   ? ? ?Past Surgical History:  ?Procedure Laterality Date  ? CARDIAC CATHETERIZATION    ? CATARACT EXTRACTION W/PHACO Left 11/20/2015  ? Procedure: CATARACT EXTRACTION PHACO AND INTRAOCULAR LENS PLACEMENT (IOC);  Surgeon: Birder Robson, MD;  Location: [facility] ORS;  Service: Ophthalmology;  Laterality: Left;  [place] ?AP% ?CDE ?fluid pack lot # [id number]  ? CHOLECYSTECTOMY  2011  ? COLONOSCOPY  2009, 2013  ? Dr Vassie Moment ADENOMA   ? COLONOSCOPY WITH PROPOFOL N/A 07/24/2016  ? Procedure: COLONOSCOPY WITH PROPOFOL;  Surgeon: Robert Bellow, MD;  Location: [facility] ENDOSCOPY;  Service: Endoscopy;  Laterality: N/A;  ? COLONOSCOPY WITH PROPOFOL N/A 07/04/2021  ? Procedure: COLONOSCOPY WITH PROPOFOL;  Surgeon: Robert Bellow, MD;  Location: [facility] ENDOSCOPY;  Service: Endoscopy;  Laterality: N/A;  1ST CASE PER OFFICE  ? cystectomy  2014  ? ileal conduit diversion   ? HERNIA REPAIR  6568  ? umbilical   ? MITRAL VALVE REPLACEMENT    ? TONSILLECTOMY  AS CHILD  ? TRANSTHORACIC ECHOCARDIOGRAM  11-03-2008  DR Ubaldo Glassing [name])  ? LVF NORMAL/ EF 55-60%/ LEFT ATRIAL ENLARGEMENT/  ADEQUATELY FUNCTIONING MITRAL VALVE REPAIR/  MILD MR & TRI/  NO SIGNIFICANT CHANGE  FROM PREVIOUS ECHO  ? TRANSURETHRAL RESECTION OF BLADDER TUMOR N/A 07/27/2012  ? Procedure: TRANSURETHRAL RESECTION OF BLADDER TUMOR (TURBT);  Surgeon: Franchot Gallo, MD;  Location: [facility];  Service: Urology;  Laterality: N/A;  WITH MITOMYCIN INSTILLATION ?  ? urethroptomy  11/05/2016  ? [place]  ? valvuloplasty with replacement of cosgrove ring,1 congenital ruptur of cordi  2004   [place]  ? mitral valve   ? ? ?Family History   ?Adopted: Yes  ?Problem Relation Age of Onset  ? Colon cancer Mother   ? Cancer Neg Hx   ?     Prostate,Kidney,Bladder  ? Prostate cancer Neg Hx   ? Bladder Cancer Neg Hx   ? Kidney cancer Neg Hx   ? ? ?SOCIAL HX:  reports that he quit smoking about 9 years ago. His smoking use included cigarettes. He has never used smokeless tobacco. He reports current alcohol use of about 1.0 standard drink per week. He reports that he does not use drugs.  ? ? ?Current Outpatient Medications:  ?  cefdinir (OMNICEF) 300 MG capsule, Take 1 capsule (300 mg total) by mouth 2 (two) times daily., Disp: 28 capsule, Rfl: 0 ?  Clindamycin Phosphate foam, APPLY 1 GRAM ON SKIN DAILY, Disp: , Rfl: 5 ?  desonide (DESOWEN) 0.05 % lotion, , Disp: , Rfl:  ?  DEXILANT 60 MG capsule, Take 1 capsule (60 mg total) by mouth daily., Disp: 90 capsule, Rfl: 1 ?  dicyclomine (BENTYL) 10 MG capsule, Take 1 capsule (10 mg total) by mouth 2 (two) times daily., Disp: 180 capsule, Rfl: 1 ?  doxylamine, Sleep, (UNISOM SLEEPTABS) 25 MG tablet, Take by mouth., Disp: , Rfl:  ?  gabapentin (NEURONTIN) 300 MG capsule, TAKE 1 CAPSULE BY MOUTH TWICE DAILY, Disp: 180 capsule, Rfl: 1 ?  hydrocortisone-pramoxine (ANALPRAM-HC) 2.5-1 % rectal cream, APPLY A SMALL AMOUNT TWICE  A DAY AS NEEDED, Disp: 30 g, Rfl: 0 ?  meloxicam (MOBIC) 15 MG tablet, Take 1 tablet (15 mg total) by mouth daily., Disp: 30 tablet, Rfl: 5 ?  metroNIDAZOLE (METROGEL) 1 % gel, APLLY TOPICALLY EVERY DAY, Disp: 60 g, Rfl: 5 ?  mirtazapine (REMERON SOL-TAB) 15 MG disintegrating tablet, Take 1 tablet (15 mg total) by mouth at bedtime., Disp: 90 tablet, Rfl: 1 ?  mometasone (ELOCON) 0.1 % cream, APPLY TO AFFECTED AREAS TWICE DAILY AS NEEDED FOR RASH, Disp: 60 g, Rfl: 1 ?  mupirocin ointment (BACTROBAN) 2 %, APPLY A SMALL AMOUNT TO AFFECTED AREA TWICE DAILY, Disp: 22 g, Rfl: 11 ?  nystatin (MYCOSTATIN/NYSTOP) powder, Apply topically 4 (four) times daily., Disp: 15 g, Rfl: 4 ?  polyethylene glycol  (MIRALAX / GLYCOLAX) packet, Take 17 g by mouth as needed. , Disp: , Rfl:  ?  predniSONE (DELTASONE) 10 MG tablet, 6 tablets daily for 3 days, then reduce by 1 tablet daily until gone, Disp: 33 tablet, Rfl: 0 ?  Probiotic Product (PROBIOTIC & ACIDOPHILUS EX ST PO), Take 2 tablets by mouth at bedtime as needed., Disp: , Rfl:  ?  traMADol (ULTRAM) 50 MG tablet, Take 1 tablet (50 mg total) by mouth every 6 (six) hours as needed., Disp: 30 tablet, Rfl: 0 ?  triamcinolone cream (KENALOG) 0.1 %, , Disp: , Rfl:  ?  valACYclovir (VALTREX) 1000 MG tablet, Take 1 tablet (1,000 mg total) by mouth 2 (two) times daily., Disp: 30 tablet, Rfl: 11 ? ?EXAM: ? ?VITALS per patient if applicable: ? ?GENERAL: alert, oriented,  appears well and in no acute distress ? ?HEENT: atraumatic, conjunttiva clear, no obvious abnormalities on inspection of external nose and ears ? ?NECK: normal movements of the head and neck ? ?LUNGS: on inspection no signs of respiratory distress, breathing rate appears normal, no obvious gross SOB, gasping or wheezing ? ?CV: no obvious cyanosis ? ?MS: moves all visible extremities without noticeable abnormality ? ?PSYCH/NEURO: pleasant and cooperative, no obvious depression or anxiety, speech and thought processing grossly intact ? ?ASSESSMENT AND PLAN: ? ?Discussed the following assessment and plan: ? ?Acute recurrent maxillary sinusitis ? ?Acute recurrent sinusitis ?He is augmentin intolerant.   rx cefdinir x 14 days  8 day prednisone taper ;  Continue saline irrigation, flonase and  Oral decongestants.  Will add levaquin if no improvement after 405 days  ? ?  ?I discussed the assessment and treatment plan with the patient. The patient was provided an opportunity to ask questions and all were answered. The patient agreed with the plan and demonstrated an understanding of the instructions. ?  ?The patient was advised to call back or seek an in-person evaluation if the symptoms worsen or if the condition fails to  improve as anticipated. ? ? ? ? ?Crecencio Mc, MD   ?

## 2021-07-31 NOTE — Assessment & Plan Note (Signed)
He is augmentin intolerant.   rx cefdinir x 14 days  8 day prednisone taper ;  Continue saline irrigation, flonase and  Oral decongestants.  Will add levaquin if no improvement after 405 days  ?

## 2021-09-19 ENCOUNTER — Other Ambulatory Visit: Payer: Self-pay | Admitting: Internal Medicine

## 2021-09-19 ENCOUNTER — Ambulatory Visit (INDEPENDENT_AMBULATORY_CARE_PROVIDER_SITE_OTHER): Payer: Medicare PPO

## 2021-09-19 ENCOUNTER — Encounter: Payer: Self-pay | Admitting: Internal Medicine

## 2021-09-19 ENCOUNTER — Ambulatory Visit: Payer: Medicare PPO | Admitting: Internal Medicine

## 2021-09-19 VITALS — BP 130/70 | HR 84 | Temp 98.0°F | Ht 66.0 in | Wt 155.4 lb

## 2021-09-19 DIAGNOSIS — Z906 Acquired absence of other parts of urinary tract: Secondary | ICD-10-CM | POA: Diagnosis not present

## 2021-09-19 DIAGNOSIS — R110 Nausea: Secondary | ICD-10-CM

## 2021-09-19 DIAGNOSIS — M6281 Muscle weakness (generalized): Secondary | ICD-10-CM

## 2021-09-19 DIAGNOSIS — R519 Headache, unspecified: Secondary | ICD-10-CM | POA: Diagnosis not present

## 2021-09-19 DIAGNOSIS — J069 Acute upper respiratory infection, unspecified: Secondary | ICD-10-CM

## 2021-09-19 DIAGNOSIS — G8929 Other chronic pain: Secondary | ICD-10-CM

## 2021-09-19 DIAGNOSIS — R059 Cough, unspecified: Secondary | ICD-10-CM | POA: Diagnosis not present

## 2021-09-19 DIAGNOSIS — M545 Low back pain, unspecified: Secondary | ICD-10-CM

## 2021-09-19 LAB — COMPREHENSIVE METABOLIC PANEL
ALT: 23 U/L (ref 0–53)
AST: 22 U/L (ref 0–37)
Albumin: 4.3 g/dL (ref 3.5–5.2)
Alkaline Phosphatase: 58 U/L (ref 39–117)
BUN: 11 mg/dL (ref 6–23)
CO2: 30 mEq/L (ref 19–32)
Calcium: 9.6 mg/dL (ref 8.4–10.5)
Chloride: 101 mEq/L (ref 96–112)
Creatinine, Ser: 0.93 mg/dL (ref 0.40–1.50)
GFR: 82.73 mL/min (ref >60.00)
Glucose, Bld: 147 mg/dL — ABNORMAL HIGH (ref 70–99)
Potassium: 3.7 mEq/L (ref 3.5–5.1)
Sodium: 138 mEq/L (ref 135–145)
Total Bilirubin: 0.5 mg/dL (ref 0.2–1.2)
Total Protein: 7.1 g/dL (ref 6.0–8.3)

## 2021-09-19 LAB — CBC WITH DIFFERENTIAL/PLATELET
Basophils Absolute: 0.1 10*3/uL (ref 0.0–0.1)
Basophils Relative: 0.9 % (ref 0.0–3.0)
Eosinophils Absolute: 0.2 10*3/uL (ref 0.0–0.7)
Eosinophils Relative: 2.3 % (ref 0.0–5.0)
HCT: 42.4 % (ref 39.0–52.0)
Hemoglobin: 14.3 g/dL (ref 13.0–17.0)
Lymphocytes Relative: 15.2 % (ref 12.0–46.0)
Lymphs Abs: 1.2 10*3/uL (ref 0.7–4.0)
MCHC: 33.7 g/dL (ref 30.0–36.0)
MCV: 89.9 fl (ref 78.0–100.0)
Monocytes Absolute: 0.6 10*3/uL (ref 0.1–1.0)
Monocytes Relative: 7.6 % (ref 3.0–12.0)
Neutro Abs: 6 10*3/uL (ref 1.4–7.7)
Neutrophils Relative %: 74 % (ref 43.0–77.0)
Platelets: 224 10*3/uL (ref 150.0–400.0)
RBC: 4.71 Mil/uL (ref 4.22–5.81)
RDW: 13.2 % (ref 11.5–15.5)
WBC: 8.1 10*3/uL (ref 4.0–10.5)

## 2021-09-19 LAB — POCT RAPID STREP A (OFFICE): Rapid Strep A Screen: NEGATIVE

## 2021-09-19 LAB — POC INFLUENZA A&B (BINAX/QUICKVUE)
Influenza A, POC: NEGATIVE
Influenza B, POC: NEGATIVE

## 2021-09-19 LAB — POC COVID19 BINAXNOW: SARS Coronavirus 2 Ag: NEGATIVE

## 2021-09-19 LAB — MONONUCLEOSIS SCREEN: Mono Screen: NEGATIVE

## 2021-09-19 MED ORDER — LEVALBUTEROL TARTRATE 45 MCG/ACT IN AERO: 1.0000 | INHALATION_SPRAY | Freq: Four times a day (QID) | RESPIRATORY_TRACT | 12 refills | Status: DC | PRN

## 2021-09-19 MED ORDER — HYDROCOD POLI-CHLORPHE POLI ER 10-8 MG/5ML PO SUER: 5.0000 mL | Freq: Every evening | ORAL | 0 refills | Status: DC | PRN

## 2021-09-19 MED ORDER — PREDNISONE 10 MG PO TABS: ORAL_TABLET | ORAL | 0 refills | Status: DC

## 2021-09-19 NOTE — Progress Notes (Addendum)
Subjective:  Patient ID: Tony Cox, male    DOB: 1950/10/10  Age: 71 y.o. MRN: [id number]  CC: The primary encounter diagnosis was Upper respiratory tract infection, unspecified type. Diagnoses of Acute nonintractable headache, unspecified headache type, Nausea, H/O total cystectomy, Truncal muscle weakness, and Chronic midline low back pain without sciatica were also pertinent to this visit.   HPI Tony Cox presents for URI/LRI symptoms x 4 days which started on the return trip after travelling to  [facility] with [employer] college students.  Home COVID test has been negative.   Last test may 30.  Subjective LG fevers, nothing above 100,   chest tight/heavy  for 3-4 days , headache.  Started using an albuterol MDI and cefdinir  started  48 hours ago,  with no improvement.  Thinks he Feels worse.  Taking robitussin .  Reports Profound fatigue and moderate headache . Worked this morning .  Has been over exerting himself with the college students,  recitals  and now graduation.  Denis  sore throat,  dysgeusia and anosmia.  Not drinking or eating much due to lack of  appetite .    Last treated with cefdinir on April 11 for sinusitis (14 day supply given) .  Only took 7 days,  symptoms completely resolved.   Outpatient Medications Prior to Visit  Medication Sig Dispense Refill   cefdinir (OMNICEF) 300 MG capsule Take 1 capsule (300 mg total) by mouth 2 (two) times daily. 28 capsule 0   Clindamycin Phosphate foam APPLY 1 GRAM ON SKIN DAILY  5   desonide (DESOWEN) 0.05 % lotion      doxylamine, Sleep, (UNISOM SLEEPTABS) 25 MG tablet Take by mouth.     hydrocortisone-pramoxine (ANALPRAM-HC) 2.5-1 % rectal cream APPLY A SMALL AMOUNT TWICE  A DAY AS NEEDED 30 g 0   meloxicam (MOBIC) 15 MG tablet Take 1 tablet (15 mg total) by mouth daily. 30 tablet 5   mometasone (ELOCON) 0.1 % cream APPLY TO AFFECTED AREAS TWICE DAILY AS NEEDED FOR RASH 60 g 1   mupirocin ointment (BACTROBAN) 2 % APPLY A SMALL  AMOUNT TO AFFECTED AREA TWICE DAILY 22 g 11   nystatin (MYCOSTATIN/NYSTOP) powder Apply topically 4 (four) times daily. 15 g 4   polyethylene glycol (MIRALAX / GLYCOLAX) packet Take 17 g by mouth as needed.      predniSONE (DELTASONE) 10 MG tablet 6 tablets daily for 3 days, then reduce by 1 tablet daily until gone 33 tablet 0   Probiotic Product (PROBIOTIC & ACIDOPHILUS EX ST PO) Take 2 tablets by mouth at bedtime as needed.     traMADol (ULTRAM) 50 MG tablet Take 1 tablet (50 mg total) by mouth every 6 (six) hours as needed. 30 tablet 0   triamcinolone cream (KENALOG) 0.1 %      valACYclovir (VALTREX) 1000 MG tablet Take 1 tablet (1,000 mg total) by mouth 2 (two) times daily. 30 tablet 11   DEXILANT 60 MG capsule Take 1 capsule (60 mg total) by mouth daily. 90 capsule 1   dicyclomine (BENTYL) 10 MG capsule Take 1 capsule (10 mg total) by mouth 2 (two) times daily. 180 capsule 1   gabapentin (NEURONTIN) 300 MG capsule TAKE 1 CAPSULE BY MOUTH TWICE DAILY 180 capsule 1   metroNIDAZOLE (METROGEL) 1 % gel APLLY TOPICALLY EVERY DAY 60 g 5   mirtazapine (REMERON SOL-TAB) 15 MG disintegrating tablet Take 1 tablet (15 mg total) by mouth at bedtime. 90 tablet 1  No facility-administered medications prior to visit.    Review of Systems;  Patient denies  unintentional weight loss, skin rash, eye pain, sinus congestion and sinus pain, sore throat, dysphagia,  hemoptysis ,  dyspnea,  palpitations except when using albuterol , orthopnea, edema, abdominal pain, nausea, melena, diarrhea, constipation, flank pain, dysuria, hematuria, urinary  Frequency, nocturia, numbness, tingling, seizures,  Focal weakness, Loss of consciousness,  Tremor, insomnia, depression, anxiety, and suicidal ideation.      Objective:  BP 130/70 (BP Location: Left Arm, Patient Position: Sitting, Cuff Size: Normal)   Pulse 84   Temp 98 F (36.7 C) (Oral)   Ht '5\' 6"'$  (1.676 m)   Wt 155 lb 6.4 oz (70.5 kg)   SpO2 95%   BMI 25.08  kg/m   BP Readings from Last 3 Encounters:  09/19/21 130/70  07/04/21 133/77  06/05/21 122/68    Wt Readings from Last 3 Encounters:  09/19/21 155 lb 6.4 oz (70.5 kg)  07/04/21 145 lb (65.8 kg)  06/05/21 156 lb (70.8 kg)    General appearance: alert, cooperative and appears stated age.  Appears exhausted but not in distress  Ears: normal TM's and external ear canals both ears Throat: lips, mucosa, and tongue normal; teeth and gums normal Neck: tender,  cervical  adenopathy, no carotid bruit, supple, symmetrical, trachea midline and thyroid not enlarged, symmetric, no tenderness/mass/nodules Face;  bilateral maxillary tenderness  Back: symmetric, no curvature. ROM normal. No CVA tenderness. Lungs: clear to auscultation bilaterally Heart: regular rate and rhythm, S1, S2 normal, no murmur, click, rub or gallop Abdomen: soft, non-tender; bowel sounds normal; no masses,  no organomegaly Pulses: 2+ and symmetric Skin: Skin color, texture, turgor normal. No rashes or lesions Lymph nodes: Cervical, supraclavicular, and axillary nodes normal.  Lab Results  Component Value Date   HGBA1C 5.7 06/05/2021    Lab Results  Component Value Date   CREATININE 0.93 09/19/2021   CREATININE 0.90 06/05/2021   CREATININE 0.81 01/01/2021    Lab Results  Component Value Date   WBC 8.1 09/19/2021   HGB 14.3 09/19/2021   HCT 42.4 09/19/2021   PLT 224.0 09/19/2021   GLUCOSE 147 (H) 09/19/2021   CHOL 176 06/05/2021   TRIG 129.0 06/05/2021   HDL 54.20 06/05/2021   LDLDIRECT 135.5 01/16/2010   LDLCALC 96 06/05/2021   ALT 23 09/19/2021   AST 22 09/19/2021   NA 138 09/19/2021   K 3.7 09/19/2021   CL 101 09/19/2021   CREATININE 0.93 09/19/2021   BUN 11 09/19/2021   CO2 30 09/19/2021   TSH 1.28 06/05/2021   PSA 2.1 10/19/2012   HGBA1C 5.7 06/05/2021   MICROALBUR 0.7 07/07/2012    No results found.  Assessment & Plan:   Problem List Items Addressed This Visit     H/O total  cystectomy    Op note from Oct 2014:  We made a 12-mm incision 24 cm above the umbilicus and passed a Veress needle through and insufflated the abdomen to 15 mmHg. We then placed a 12-mm trocar and performed peritoneoscopy to confirm there is no damage from the Veress needle or the initial trocar. No significant adhesions were identified. We placed two 8-mm robotic ports approximately 9 cm away from the camera port towards either anterior superior iliac crest and superior iliac crest and placed an additional third arm robot 9 cm lateral to the right robotic hand. We then placed a 15-mm assistant port on the patient's left side lateral to the  robotic arm, and 12 mm assistant port in the left upper quadrant approximately 10 cm superior to the left robotic arm. The patient was then placed in steep Trendelenburg position and the robot was then docked.   We began by identifying the course of the left ureter, which was mobilized near the iliac vessels and dissected out distally to its insertion in the bladder. A Hem-o-Lock clip with a suture attached to it was placed across the distal ureter and another Hem-0-lock placed more distally at its insertion to the bladder. The ureter was transected. A similar procedure was performed on the patient's right side.   We then connected the peritoneal incisions around the posterior side of the bladder where the ureters had been tunneling and developed a plane between the bladder and the rectum. Dissection was continued behind the prostate and seminal vesicles toward the apex of the prostate using a combination of blunt and sharp dissection. Attention was then directed to the development of the right lateral pelvic space. The right medial umbilical ligament was mobilized and the retropubic space was entered lateral to the bladder. Dissection was carried to the endopelvic fascia, whereupon a window was developed lateral to the prostate to accommodate the EndoGIA stapler. The  bladder pedicle was thinned out a little bit to allow passage of a stapler. The EndoGIA stapler was then used to take the bladder and prostate pedicle. We then continued dissecting and used a second staple load to take the remainder of the prostatic pedicle. This was repeated on the patient's left hand side.  We then transected the medial umbilical ligaments and the urachus. We entered into the space of Retzius and then began dropping the bladder from the anterior abdominal wall. Once we developed this plane down to the pubic symphysis, we defatted the tissue overlying the prostate and used bipolar cautery to secure the superficial dorsal venous complex. The remainder of the endopelvic fascia lateral to the prostate on either side was then opened and the levator fibers were swept off laterally working towards the apex. The puboprostatic ligaments were transected using electrocautery, exposing the DVC beneath. Using a 0 Vicryl suture on a CT1 needle, a figure-of-eight stitch was placed through the DVC to suture ligate and then used another stitch through the mid portion of the prostate as a bunching and backbleeding stitch. The DVC was then transected using electrocautery down to the level of the urethra, which was circumferentially mobilized.   The Foley catheter balloon was deflated and the catheter was withdrawn and we placed two Hem-o-Lock clips on the urethra and transected between this. The remaining rectourethralis muscle fibers were then severed freeing up our specimen in its entirety, which was placed into an Endocatch bag. The pelvis was copiously irrigated, hemostasis was excellent.  We then performed our pelvic lymph node dissection beginning on the right hand side. We identified the external iliac vein and dissected the lymphatic tissue off of this, working distally towards the node of Cloquet. We identified the obturator nerve and vessels and carried the node packet proximally to the internal  iliac vein. The fascia lateral to the external iliac artery was opened, identifying the genitofemoral femoral nerve. The nodal tissue between the artery and nerve were then dissected out and carried back all the way along the artery to the bifurcation of the iliac vessels. There was some firm appearing nodal tissue here which was successfully excised and sent for pathology. A similar dissection was repeated on the patient's left-hand side. Again,  both sides were inspected to ensure we had good hemostasis. The tagged ureters were passed through the assistant port and the robot was undocked.   We then made an infraumbilical incision and extracted the retrieval bag through here and then brought up both ureters up and placed on hemostat tags. The terminal ileum was identified and brought up through the wound and inspected. The two ends were marked with silk sutures and then we used an endovascular GIA to staple across the bowel and the mesentery. The continuity of the bowel was reestablished in typical side-to-side fashion using a 75-mm linear GIA stapler. The corners were reinforced with silk pop offs and the bowel was returned to the abdomen.  The distal end of the conduit was then opened and the mesentery just at the terminal portion dissected back to evert and create a good rosebud. A Wallace anastomosis was performed. The ureters were spatulated and using 4-0 Vicryl, they were longitudinally sewn together along their apposing edges. The staple line was removed from the base of the conduit. The ureter was then anastomosed to the conduit with running a 4-0 Vicryl suture. Stents were placed prior to closure of the anterior wall. The red stent was placed on the right and the blue stent on the left.  Through the previously marked stomal site on the skin, we then used a 10 blade to cut across the skin to create a bevel on the dermis. The fat plug was then removed with electrocautery down to the level of the fascia.  A cruciate incision was made through the fascia and the muscle was spread and we cut through the transversalis and peritoneum on inside. A series of 2-0 Vicryl sutures were placed at each corner of the fascial leaflets. A Babcock was placed from outside in through the stomal site, grasped the skin ends of the conduits as well as both stents, and guided up through that incision, keeping the mesentery in the inferolateral position. The preplaced 2-0 Vicryl sutures were then sewn to the proximal most portion of the bowel to observe the conduit and secured in place. We then used the tails of these stitches in 4 quadrants to start to evert rosebud taking skin and part way up the conduit followed by the actual distal portion of the conduit beginning at the everting process. The inner lining spaces were then closed with an additional 3-0 Vicryl sutures.  A 10-mm JP drain was then placed through the left lower quadrant robotic port and placed in the superior pelvis and secured in place with a 2-0 nylon suture. The fascia underlying the15-mm port and 66m port sites were closed with an #1 Vicryl. The fascial incision was then closed with 0-Looped PDS running in 2 directions and tied in the middle. All skin incisions were then closed with a 4-0 Monocryl running subcuticular stitches, followed by Dermabond. A urostomy appliance was placed over the urostomy.       URI (upper respiratory infection) - Primary    COVID, FLU TESTS NEGATIVE.  Chest x ray pending.  Continue cefdinir given his age and comorbidities       Relevant Orders   POC Influenza A&B (Binax test) (Completed)   POCT rapid strep A (Completed)   POC COVID-19 (Completed)   CBC with Differential/Platelet (Completed)   DG Chest 2 View (Completed)   Other Visit Diagnoses     Acute nonintractable headache, unspecified headache type       Relevant Orders   Monospot (Completed)  Nausea       Relevant Orders   Comprehensive metabolic panel  (Completed)   Truncal muscle weakness       Relevant Orders   Ambulatory referral to Physical Therapy   Chronic midline low back pain without sciatica       Relevant Medications   predniSONE (DELTASONE) 10 MG tablet   Other Relevant Orders   Ambulatory referral to Physical Therapy       I spent a total of 34 minutes with this patient in a face to face visit on the date of this encounter reviewing the last office visit with me on    April 23, patient's diet and eating habits,    and post visit ordering of testing and therapeutics.    Follow-up: No follow-ups on file.   Crecencio Mc, MD

## 2021-09-19 NOTE — Patient Instructions (Addendum)
Continue the cefdinir for 7 days (twice daily)  Start the prolonged prednisone taper (6 tablets all at once on Days 1-3, Then taper by 1 tablet daily until gone Tussionex cough syrup (YOU WILL SLEEP)   If chest  x ray shows a pneumonia,  I will ADD a second antibiotic to take simultaneously. IF THE TEST FOR MONO IS POSITIVE ,  ONLY REST , HYDRATION AND SYMPTOM CONTROL is the cure    Please eat yogurt or take a probiotic for 3 weeks .  VERY IMPORTANT

## 2021-09-19 NOTE — Assessment & Plan Note (Signed)
COVID, FLU TESTS NEGATIVE.  Chest x ray pending.  Continue cefdinir given his age and comorbidities

## 2021-09-20 ENCOUNTER — Encounter: Payer: Self-pay | Admitting: Internal Medicine

## 2021-09-27 ENCOUNTER — Telehealth: Payer: Self-pay

## 2021-09-27 NOTE — Telephone Encounter (Signed)
Lavalbuterol has been denied by insurance.

## 2021-09-28 NOTE — Telephone Encounter (Signed)
Patient verbally understood Lavalbuterol denied and to stick with Albuterol that he's currently on. No question.

## 2021-10-07 DIAGNOSIS — Z20822 Contact with and (suspected) exposure to covid-19: Secondary | ICD-10-CM | POA: Diagnosis not present

## 2021-10-07 DIAGNOSIS — M940 Chondrocostal junction syndrome [Tietze]: Secondary | ICD-10-CM | POA: Diagnosis not present

## 2021-10-07 DIAGNOSIS — R059 Cough, unspecified: Secondary | ICD-10-CM | POA: Diagnosis not present

## 2021-10-07 DIAGNOSIS — R9431 Abnormal electrocardiogram [ECG] [EKG]: Secondary | ICD-10-CM | POA: Diagnosis not present

## 2021-10-09 DIAGNOSIS — Z9889 Other specified postprocedural states: Secondary | ICD-10-CM | POA: Diagnosis not present

## 2021-10-09 DIAGNOSIS — G64 Other disorders of peripheral nervous system: Secondary | ICD-10-CM | POA: Diagnosis not present

## 2021-10-09 DIAGNOSIS — Z8551 Personal history of malignant neoplasm of bladder: Secondary | ICD-10-CM | POA: Diagnosis not present

## 2021-10-09 DIAGNOSIS — R0609 Other forms of dyspnea: Secondary | ICD-10-CM | POA: Diagnosis not present

## 2021-10-09 DIAGNOSIS — R9431 Abnormal electrocardiogram [ECG] [EKG]: Secondary | ICD-10-CM | POA: Diagnosis not present

## 2021-10-09 DIAGNOSIS — K219 Gastro-esophageal reflux disease without esophagitis: Secondary | ICD-10-CM | POA: Diagnosis not present

## 2021-10-09 DIAGNOSIS — Z87891 Personal history of nicotine dependence: Secondary | ICD-10-CM | POA: Diagnosis not present

## 2021-10-09 DIAGNOSIS — J069 Acute upper respiratory infection, unspecified: Secondary | ICD-10-CM | POA: Diagnosis not present

## 2021-10-29 DIAGNOSIS — D303 Benign neoplasm of bladder: Secondary | ICD-10-CM | POA: Diagnosis not present

## 2021-10-29 DIAGNOSIS — C61 Malignant neoplasm of prostate: Secondary | ICD-10-CM | POA: Diagnosis not present

## 2021-10-29 DIAGNOSIS — Z8551 Personal history of malignant neoplasm of bladder: Secondary | ICD-10-CM | POA: Diagnosis not present

## 2021-10-29 DIAGNOSIS — C679 Malignant neoplasm of bladder, unspecified: Secondary | ICD-10-CM | POA: Diagnosis not present

## 2021-10-29 DIAGNOSIS — Z9221 Personal history of antineoplastic chemotherapy: Secondary | ICD-10-CM | POA: Diagnosis not present

## 2021-10-29 DIAGNOSIS — F329 Major depressive disorder, single episode, unspecified: Secondary | ICD-10-CM | POA: Diagnosis not present

## 2021-10-29 DIAGNOSIS — Z8546 Personal history of malignant neoplasm of prostate: Secondary | ICD-10-CM | POA: Diagnosis not present

## 2021-10-29 DIAGNOSIS — Z87891 Personal history of nicotine dependence: Secondary | ICD-10-CM | POA: Diagnosis not present

## 2021-10-31 ENCOUNTER — Other Ambulatory Visit: Payer: Self-pay | Admitting: Internal Medicine

## 2021-11-06 ENCOUNTER — Other Ambulatory Visit: Payer: Self-pay | Admitting: Internal Medicine

## 2021-11-06 MED ORDER — MIRTAZAPINE 15 MG PO TBDP: ORAL_TABLET | ORAL | 3 refills | Status: DC

## 2021-11-06 MED ORDER — CARVEDILOL PHOSPHATE ER 10 MG PO CP24: 10.0000 mg | ORAL_CAPSULE | Freq: Every day | ORAL | 3 refills | Status: DC

## 2021-11-12 ENCOUNTER — Other Ambulatory Visit: Payer: Self-pay | Admitting: Internal Medicine

## 2021-11-12 MED ORDER — COREG CR 10 MG PO CP24: 10.0000 mg | ORAL_CAPSULE | Freq: Every day | ORAL | 3 refills | Status: DC

## 2021-11-20 ENCOUNTER — Other Ambulatory Visit: Payer: Self-pay | Admitting: Urology

## 2021-11-20 MED ORDER — NYSTATIN 100000 UNIT/GM EX POWD: 1.0000 | Freq: Three times a day (TID) | CUTANEOUS | 0 refills | Status: DC

## 2021-11-21 ENCOUNTER — Other Ambulatory Visit: Payer: Self-pay

## 2021-11-21 MED ORDER — METRONIDAZOLE 1 % EX GEL: Freq: Every day | CUTANEOUS | 0 refills | Status: DC

## 2021-11-21 NOTE — Progress Notes (Signed)
Pt stopped in office asking for a refill of rosacea gel because he ran out.  Pt has f/u appointment on 01/08/22.  One refill of pts Metronidazole 1% to [place] qhs to face sent to [facility].  Pt advised to keep his f/u appointment with Dr. Oneta Rack

## 2021-11-23 DIAGNOSIS — Z7189 Other specified counseling: Secondary | ICD-10-CM | POA: Diagnosis not present

## 2021-11-23 DIAGNOSIS — Z9889 Other specified postprocedural states: Secondary | ICD-10-CM | POA: Diagnosis not present

## 2021-11-23 DIAGNOSIS — I5189 Other ill-defined heart diseases: Secondary | ICD-10-CM | POA: Diagnosis not present

## 2021-11-26 ENCOUNTER — Other Ambulatory Visit: Payer: Self-pay | Admitting: Internal Medicine

## 2021-12-02 ENCOUNTER — Encounter: Payer: Self-pay | Admitting: Urology

## 2021-12-06 ENCOUNTER — Encounter: Payer: Self-pay | Admitting: Urology

## 2021-12-07 ENCOUNTER — Telehealth: Payer: Self-pay

## 2021-12-07 ENCOUNTER — Encounter: Payer: Self-pay | Admitting: Urology

## 2021-12-07 NOTE — Telephone Encounter (Signed)
Tony Cox called from [facility] to request a physical therapy prescription for patient.  Tony Cox states he would like to have the prescription dated last Thursday if possible.  Tony Cox states he has questions about patient and would like to speak directly with Dr. Deborra Medina.

## 2021-12-10 DIAGNOSIS — Z906 Acquired absence of other parts of urinary tract: Secondary | ICD-10-CM | POA: Insufficient documentation

## 2021-12-10 NOTE — Addendum Note (Signed)
Addended by: Crecencio Mc on: 12/10/2021 12:30 PM   Modules accepted: Orders

## 2021-12-10 NOTE — Assessment & Plan Note (Signed)
Op note from Oct 2014:  We made a 12-mm incision 24 cm above the umbilicus and passed a Veress needle through and insufflated the abdomen to 15 mmHg. We then placed a 12-mm trocar and performed peritoneoscopy to confirm there is no damage from the Veress needle or the initial trocar. No significant adhesions were identified. We placed two 8-mm robotic ports approximately 9 cm away from the camera port towards either anterior superior iliac crest and superior iliac crest and placed an additional third arm robot 9 cm lateral to the right robotic hand. We then placed a 15-mm assistant port on the patient's left side lateral to the robotic arm, and 12 mm assistant port in the left upper quadrant approximately 10 cm superior to the left robotic arm. The patient was then placed in steep Trendelenburg position and the robot was then docked.   We began by identifying the course of the left ureter, which was mobilized near the iliac vessels and dissected out distally to its insertion in the bladder. A Hem-o-Lock clip with a suture attached to it was placed across the distal ureter and another Hem-0-lock placed more distally at its insertion to the bladder. The ureter was transected. A similar procedure was performed on the patient's right side.   We then connected the peritoneal incisions around the posterior side of the bladder where the ureters had been tunneling and developed a plane between the bladder and the rectum. Dissection was continued behind the prostate and seminal vesicles toward the apex of the prostate using a combination of blunt and sharp dissection. Attention was then directed to the development of the right lateral pelvic space. The right medial umbilical ligament was mobilized and the retropubic space was entered lateral to the bladder. Dissection was carried to the endopelvic fascia, whereupon a window was developed lateral to the prostate to accommodate the EndoGIA stapler. The bladder pedicle  was thinned out a little bit to allow passage of a stapler. The EndoGIA stapler was then used to take the bladder and prostate pedicle. We then continued dissecting and used a second staple load to take the remainder of the prostatic pedicle. This was repeated on the patient's left hand side.  We then transected the medial umbilical ligaments and the urachus. We entered into the space of Retzius and then began dropping the bladder from the anterior abdominal wall. Once we developed this plane down to the pubic symphysis, we defatted the tissue overlying the prostate and used bipolar cautery to secure the superficial dorsal venous complex. The remainder of the endopelvic fascia lateral to the prostate on either side was then opened and the levator fibers were swept off laterally working towards the apex. The puboprostatic ligaments were transected using electrocautery, exposing the DVC beneath. Using a 0 Vicryl suture on a CT1 needle, a figure-of-eight stitch was placed through the DVC to suture ligate and then used another stitch through the mid portion of the prostate as a bunching and backbleeding stitch. The DVC was then transected using electrocautery down to the level of the urethra, which was circumferentially mobilized.   The Foley catheter balloon was deflated and the catheter was withdrawn and we placed two Hem-o-Lock clips on the urethra and transected between this. The remaining rectourethralis muscle fibers were then severed freeing up our specimen in its entirety, which was placed into an Endocatch bag. The pelvis was copiously irrigated, hemostasis was excellent.  We then performed our pelvic lymph node dissection beginning on the right hand  side. We identified the external iliac vein and dissected the lymphatic tissue off of this, working distally towards the node of Cloquet. We identified the obturator nerve and vessels and carried the node packet proximally to the internal iliac vein. The  fascia lateral to the external iliac artery was opened, identifying the genitofemoral femoral nerve. The nodal tissue between the artery and nerve were then dissected out and carried back all the way along the artery to the bifurcation of the iliac vessels. There was some firm appearing nodal tissue here which was successfully excised and sent for pathology. A similar dissection was repeated on the patient's left-hand side. Again, both sides were inspected to ensure we had good hemostasis. The tagged ureters were passed through the assistant port and the robot was undocked.   We then made an infraumbilical incision and extracted the retrieval bag through here and then brought up both ureters up and placed on hemostat tags. The terminal ileum was identified and brought up through the wound and inspected. The two ends were marked with silk sutures and then we used an endovascular GIA to staple across the bowel and the mesentery. The continuity of the bowel was reestablished in typical side-to-side fashion using a 75-mm linear GIA stapler. The corners were reinforced with silk pop offs and the bowel was returned to the abdomen.  The distal end of the conduit was then opened and the mesentery just at the terminal portion dissected back to evert and create a good rosebud. A Wallace anastomosis was performed. The ureters were spatulated and using 4-0 Vicryl, they were longitudinally sewn together along their apposing edges. The staple line was removed from the base of the conduit. The ureter was then anastomosed to the conduit with running a 4-0 Vicryl suture. Stents were placed prior to closure of the anterior wall. The red stent was placed on the right and the blue stent on the left.  Through the previously marked stomal site on the skin, we then used a 10 blade to cut across the skin to create a bevel on the dermis. The fat plug was then removed with electrocautery down to the level of the fascia. A cruciate  incision was made through the fascia and the muscle was spread and we cut through the transversalis and peritoneum on inside. A series of 2-0 Vicryl sutures were placed at each corner of the fascial leaflets. A Babcock was placed from outside in through the stomal site, grasped the skin ends of the conduits as well as both stents, and guided up through that incision, keeping the mesentery in the inferolateral position. The preplaced 2-0 Vicryl sutures were then sewn to the proximal most portion of the bowel to observe the conduit and secured in place. We then used the tails of these stitches in 4 quadrants to start to evert rosebud taking skin and part way up the conduit followed by the actual distal portion of the conduit beginning at the everting process. The inner lining spaces were then closed with an additional 3-0 Vicryl sutures.  A 10-mm JP drain was then placed through the left lower quadrant robotic port and placed in the superior pelvis and secured in place with a 2-0 nylon suture. The fascia underlying the15-mm port and 52m port sites were closed with an #1 Vicryl. The fascial incision was then closed with 0-Looped PDS running in 2 directions and tied in the middle. All skin incisions were then closed with a 4-0 Monocryl running subcuticular stitches, followed  by Dermabond. A urostomy appliance was placed over the urostomy.

## 2021-12-18 ENCOUNTER — Other Ambulatory Visit: Payer: Self-pay

## 2021-12-18 ENCOUNTER — Other Ambulatory Visit: Payer: Self-pay | Admitting: Urology

## 2021-12-18 DIAGNOSIS — B379 Candidiasis, unspecified: Secondary | ICD-10-CM

## 2021-12-18 MED ORDER — FLUCONAZOLE 100 MG PO TABS: 100.0000 mg | ORAL_TABLET | Freq: Every day | ORAL | 0 refills | Status: DC

## 2021-12-18 NOTE — Patient Outreach (Signed)
[facility]) Care Management  12/18/2021  Tony Cox [birth year] [id number]   Telephone Screen    Outreach call to patient to introduce [facility] services and assess care needs as part of benefit of PCP office and insurance plan. Spoke briefly with patient who reported he was at work. He remains very active-still works, travels often and works out.    Main healthcare issue/concern today: None reported. Patient states "everything going great." Denies any RN CM needs or concerns at this time.    Health Maintenance/Care Gaps Addressed & Education Provided:   -Last AWV: None on file.  Last physical: 09/08/19    [name], RN,BSN,CCM [place] [facility] Telephonic Care Management Coordinator Direct Phone: [phone] Toll Free: [phone] Fax: [phone]

## 2021-12-18 NOTE — Progress Notes (Signed)
Patient called in requesting a script for fluconazole for yeast developing around his stoma site.

## 2022-01-08 ENCOUNTER — Ambulatory Visit: Payer: Medicare PPO | Admitting: Dermatology

## 2022-01-08 ENCOUNTER — Encounter: Payer: Self-pay | Admitting: Dermatology

## 2022-01-08 DIAGNOSIS — L820 Inflamed seborrheic keratosis: Secondary | ICD-10-CM | POA: Diagnosis not present

## 2022-01-08 DIAGNOSIS — D229 Melanocytic nevi, unspecified: Secondary | ICD-10-CM

## 2022-01-08 DIAGNOSIS — L280 Lichen simplex chronicus: Secondary | ICD-10-CM

## 2022-01-08 DIAGNOSIS — L578 Other skin changes due to chronic exposure to nonionizing radiation: Secondary | ICD-10-CM | POA: Diagnosis not present

## 2022-01-08 DIAGNOSIS — L2081 Atopic neurodermatitis: Secondary | ICD-10-CM

## 2022-01-08 DIAGNOSIS — T148XXA Other injury of unspecified body region, initial encounter: Secondary | ICD-10-CM

## 2022-01-08 DIAGNOSIS — L821 Other seborrheic keratosis: Secondary | ICD-10-CM

## 2022-01-08 DIAGNOSIS — R208 Other disturbances of skin sensation: Secondary | ICD-10-CM

## 2022-01-08 DIAGNOSIS — L814 Other melanin hyperpigmentation: Secondary | ICD-10-CM

## 2022-01-08 DIAGNOSIS — B3742 Candidal balanitis: Secondary | ICD-10-CM

## 2022-01-08 MED ORDER — LIDOCAINE 5 % EX CREA: TOPICAL_CREAM | CUTANEOUS | 11 refills | Status: DC

## 2022-01-08 MED ORDER — FLUCONAZOLE 200 MG PO TABS: 200.0000 mg | ORAL_TABLET | ORAL | 3 refills | Status: DC

## 2022-01-08 NOTE — Progress Notes (Unsigned)
   Follow-Up Visit   Subjective  Tony Cox is a 71 y.o. male who presents for the following: Irregular skin lesions (Patient is concerned and would like them checked today ). Patient c/o burning, stinging sensation of the groin area and is concerned he may have a yeast infection. The patient has spots, moles and lesions to be evaluated, some may be new or changing and the patient has concerns that these could be cancer.  The following portions of the chart were reviewed this encounter and updated as appropriate:   Tobacco  Allergies  Meds  Problems  Med Hx  Surg Hx  Fam Hx     Review of Systems:  No other skin or systemic complaints except as noted in HPI or Assessment and Plan.  Objective  Well appearing patient in no apparent distress; mood and affect are within normal limits.  A focused examination was performed including the face, trunk, and extremities. Relevant physical exam findings are noted in the Assessment and Plan.  Scalp x 20 (20) Erythematous stuck-on, waxy papule or plaque  Pubic Clear. Penis shows post surgical changes, but no rash   Assessment & Plan  Inflamed seborrheic keratosis (20) Scalp x 20  Symptomatic, irritating, patient would like treated.   Destruction of lesion - Scalp x 20 Complexity: simple   Destruction method: cryotherapy   Informed consent: discussed and consent obtained   Timeout:  patient name, date of birth, surgical site, and procedure verified Lesion destroyed using liquid nitrogen: Yes   Region frozen until ice ball extended beyond lesion: Yes   Outcome: patient tolerated procedure well with no complications   Post-procedure details: wound care instructions given    Nerve damage Pubic  From surgery -   Start   Lidocaine 5 % CREA - Pubic Apply to aa's QD.  History of Yeast / Candida Balanitis  2ndary to surgical changes and leaking post Bladder cancer Diflucan 200 mg 1 po 3 days a week each episode prn disp #3  with 3 refills  Seborrheic Keratoses - Stuck-on, waxy, tan-brown papules and/or plaques  - Benign-appearing - Discussed benign etiology and prognosis. - Observe - Call for any changes  Lentigines - Scattered tan macules - Due to sun exposure - Benign-appering, observe - Recommend daily broad spectrum sunscreen SPF 30+ to sun-exposed areas, reapply every 2 hours as needed. - Call for any changes  Melanocytic Nevi - Tan-brown and/or pink-flesh-colored symmetric macules and papules - Benign appearing on exam today - Observation - Call clinic for new or changing moles - Recommend daily use of broad spectrum spf 30+ sunscreen to sun-exposed areas.   Actinic Damage - chronic, secondary to cumulative UV radiation exposure/sun exposure over time - diffuse scaly erythematous macules with underlying dyspigmentation - Recommend daily broad spectrum sunscreen SPF 30+ to sun-exposed areas, reapply every 2 hours as needed.  - Recommend staying in the shade or wearing long sleeves, sun glasses (UVA+UVB protection) and wide brim hats (4-inch brim around the entire circumference of the hat). - Call for new or changing lesions.  Return if symptoms worsen or fail to improve.  Luther Redo, CMA, am acting as scribe for Sarina Ser, MD . Documentation: I have reviewed the above documentation for accuracy and completeness, and I agree with the above.  Sarina Ser, MD

## 2022-01-08 NOTE — Patient Instructions (Addendum)
Instructions for [facility] Medications  One or more of your medications was sent to the [facility] mail order compounding pharmacy. You will receive an email from them and can purchase the medicine through that link. It will then be mailed to your home at the address you confirmed. If for any reason you do not receive an email from them, please check your spam folder. If you still do not find the email, please let us know. [facility] phone number is [phone].       Due to recent changes in healthcare laws, you may see results of your pathology and/or laboratory studies on MyChart before the doctors have had a chance to review them. We understand that in some cases there may be results that are confusing or concerning to you. Please understand that not all results are received at the same time and often the doctors may need to interpret multiple results in order to provide you with the best plan of care or course of treatment. Therefore, we ask that you please give us 2 business days to thoroughly review all your results before contacting the office for clarification. Should we see a critical lab result, you will be contacted sooner.   If You Need Anything After Your Visit  If you have any questions or concerns for your doctor, please call our main line at [phone] and press option 4 to reach your doctor's medical assistant. If no one answers, please leave a voicemail as directed and we will return your call as soon as possible. Messages left after 4 pm will be answered the following business day.   You may also send us a message via MyChart. We typically respond to MyChart messages within 1-2 business days.  For prescription refills, please ask your pharmacy to contact our office. Our fax number is [phone].  If you have an urgent issue when the clinic is closed that cannot wait until the next business day, you can page your doctor at the number below.    Please note  that while we do our best to be available for urgent issues outside of office hours, we are not available 24/7.   If you have an urgent issue and are unable to reach us, you may choose to seek medical care at your doctor's office, retail clinic, urgent care center, or emergency room.  If you have a medical emergency, please immediately call 911 or go to the emergency department.  Pager Numbers  - Dr. Kowalski: [phone]  - Dr. Moye: [phone]  - Dr. Stewart: [phone]  In the event of inclement weather, please call our main line at [phone] for an update on the status of any delays or closures.  Dermatology Medication Tips: Please keep the boxes that topical medications come in in order to help keep track of the instructions about where and how to use these. Pharmacies typically print the medication instructions only on the boxes and not directly on the medication tubes.   If your medication is too expensive, please contact our office at [phone] option 4 or send us a message through MyChart.   We are unable to tell what your co-pay for medications will be in advance as this is different depending on your insurance coverage. However, we may be able to find a substitute medication at lower cost or fill out paperwork to get insurance to cover a needed medication.   If a prior authorization is required to get your medication covered by your insurance   company, please allow us 1-2 business days to complete this process.  Drug prices often vary depending on where the prescription is filled and some pharmacies may offer cheaper prices.  The website www.goodrx.com contains coupons for medications through different pharmacies. The prices here do not account for what the cost may be with help from insurance (it may be cheaper with your insurance), but the website can give you the price if you did not use any insurance.  - You can print the associated coupon and take it with your  prescription to the pharmacy.  - You may also stop by our office during regular business hours and pick up a GoodRx coupon card.  - If you need your prescription sent electronically to a different pharmacy, notify our office through [facility] MyChart or by phone at [phone] option 4.     Si Usted Necesita Algo Despus de Su Visita  Tambin puede enviarnos un mensaje a travs de MyChart. Por lo general respondemos a los mensajes de MyChart en el transcurso de 1 a 2 das hbiles.  Para renovar recetas, por favor pida a su farmacia que se ponga en contacto con nuestra oficina. Nuestro nmero de fax es el [phone].  Si tiene un asunto urgente cuando la clnica est cerrada y que no puede esperar hasta el siguiente da hbil, puede llamar/localizar a su doctor(a) al nmero que aparece a continuacin.   Por favor, tenga en cuenta que aunque hacemos todo lo posible para estar disponibles para asuntos urgentes fuera del horario de oficina, no estamos disponibles las 24 horas del da, los 7 das de la semana.   Si tiene un problema urgente y no puede comunicarse con nosotros, puede optar por buscar atencin mdica  en el consultorio de su doctor(a), en una clnica privada, en un centro de atencin urgente o en una sala de emergencias.  Si tiene una emergencia mdica, por favor llame inmediatamente al 911 o vaya a la sala de emergencias.  Nmeros de bper  - Dr. Kowalski: [phone]  - Dra. Moye: [phone]  - Dra. Stewart: [phone]  En caso de inclemencias del tiempo, por favor llame a nuestra lnea principal al [phone] para una actualizacin sobre el estado de cualquier retraso o cierre.  Consejos para la medicacin en dermatologa: Por favor, guarde las cajas en las que vienen los medicamentos de uso tpico para ayudarle a seguir las instrucciones sobre dnde y cmo usarlos. Las farmacias generalmente imprimen las instrucciones del medicamento slo en las cajas y no  directamente en los tubos del medicamento.   Si su medicamento es muy caro, por favor, pngase en contacto con nuestra oficina llamando al [phone] y presione la opcin 4 o envenos un mensaje a travs de MyChart.   No podemos decirle cul ser su copago por los medicamentos por adelantado ya que esto es diferente dependiendo de la cobertura de su seguro. Sin embargo, es posible que podamos encontrar un medicamento sustituto a menor costo o llenar un formulario para que el seguro cubra el medicamento que se considera necesario.   Si se requiere una autorizacin previa para que su compaa de seguros cubra su medicamento, por favor permtanos de 1 a 2 das hbiles para completar este proceso.  Los precios de los medicamentos varan con frecuencia dependiendo del lugar de dnde se surte la receta y alguna farmacias pueden ofrecer precios ms baratos.  El sitio web www.goodrx.com tiene cupones para medicamentos de diferentes farmacias. Los precios aqu no tienen en cuenta   lo que podra costar con la ayuda del seguro (puede ser ms barato con su seguro), pero el sitio web puede darle el precio si no utiliz ningn seguro.  - Puede imprimir el cupn correspondiente y llevarlo con su receta a la farmacia.  - Tambin puede pasar por nuestra oficina durante el horario de atencin regular y recoger una tarjeta de cupones de GoodRx.  - Si necesita que su receta se enve electrnicamente a una farmacia diferente, informe a nuestra oficina a travs de MyChart de [place] o por telfono llamando al [phone] y presione la opcin 4.  

## 2022-01-09 DIAGNOSIS — I2510 Atherosclerotic heart disease of native coronary artery without angina pectoris: Secondary | ICD-10-CM | POA: Diagnosis not present

## 2022-01-09 DIAGNOSIS — Z136 Encounter for screening for cardiovascular disorders: Secondary | ICD-10-CM | POA: Diagnosis not present

## 2022-01-11 ENCOUNTER — Telehealth: Payer: Self-pay | Admitting: Urology

## 2022-01-11 DIAGNOSIS — R102 Pelvic and perineal pain: Secondary | ICD-10-CM

## 2022-01-11 MED ORDER — CEPHALEXIN 500 MG PO CAPS: 500.0000 mg | ORAL_CAPSULE | Freq: Two times a day (BID) | ORAL | 0 refills | Status: AC

## 2022-01-11 NOTE — Telephone Encounter (Signed)
Mr. Hermann contacted me stating that for the last two weeks he has been experiencing perineal pain that is reminiscent of prior prostate infections. He is s/p radical cystectomy and prostatectomy 2014 for bladder cancer.  He is currently followed in [place] and has an upcoming appointment with them in October.  Patient denies any modifying or aggravating factors. Patient denies any gross hematuria, dysuria or suprapubic.  Patient denies any fevers, chills, nausea or vomiting.  I have sent in 10 days of Keflex as he is unable to be seen today as I am not currently in the office. He also has a call into his Psychologist, sport and exercise in [place].

## 2022-01-17 ENCOUNTER — Ambulatory Visit: Payer: Medicare PPO | Admitting: Podiatry

## 2022-01-28 ENCOUNTER — Ambulatory Visit: Payer: Medicare PPO | Admitting: Podiatry

## 2022-01-31 ENCOUNTER — Telehealth: Payer: Self-pay | Admitting: Internal Medicine

## 2022-01-31 NOTE — Telephone Encounter (Signed)
Copied from [place] [id number]. Topic: Medicare AWV >> Jan 31, 2022  2:11 PM Jae Dire wrote: Reason for CRM:  Left message for patient to call back and schedule Medicare Annual Wellness Visit (AWV) in office.   If unable to come into the office for AWV,  please offer to do virtually or by telephone.  No hx of AWVI eligible for AWVI per [employer] as of 05/23/2016  Please schedule at anytime with [facility].  45 minute appointment for in office or Initial virtual/phone  Any questions, please contact me at [phone]

## 2022-02-13 DIAGNOSIS — Z9221 Personal history of antineoplastic chemotherapy: Secondary | ICD-10-CM | POA: Diagnosis not present

## 2022-02-13 DIAGNOSIS — Z8551 Personal history of malignant neoplasm of bladder: Secondary | ICD-10-CM | POA: Diagnosis not present

## 2022-02-13 DIAGNOSIS — Z87891 Personal history of nicotine dependence: Secondary | ICD-10-CM | POA: Diagnosis not present

## 2022-02-13 DIAGNOSIS — I340 Nonrheumatic mitral (valve) insufficiency: Secondary | ICD-10-CM | POA: Diagnosis not present

## 2022-02-13 DIAGNOSIS — Z8546 Personal history of malignant neoplasm of prostate: Secondary | ICD-10-CM | POA: Diagnosis not present

## 2022-02-13 DIAGNOSIS — R102 Pelvic and perineal pain: Secondary | ICD-10-CM | POA: Diagnosis not present

## 2022-02-18 DIAGNOSIS — C61 Malignant neoplasm of prostate: Secondary | ICD-10-CM | POA: Diagnosis not present

## 2022-02-18 DIAGNOSIS — C679 Malignant neoplasm of bladder, unspecified: Secondary | ICD-10-CM | POA: Diagnosis not present

## 2022-03-10 ENCOUNTER — Telehealth: Payer: Self-pay | Admitting: Internal Medicine

## 2022-03-10 MED ORDER — XOPENEX HFA 45 MCG/ACT IN AERO: 1.0000 | INHALATION_SPRAY | Freq: Four times a day (QID) | RESPIRATORY_TRACT | 12 refills | Status: DC | PRN

## 2022-03-10 NOTE — Telephone Encounter (Signed)
Received call from patient on Sunday morning regarding new onset fevers,  productive cough, and chest tightness .  Symptoms started on Saturday and have been getting progressively worse despite use of otc decongestants, mucolytics and cough suppressants .  Patient's last recorded temperature was 100.7 this morning .    Advised patient to continue decongestants, mucolytics and cough suppressants.  Will send in lREFILL FOR Levalbuterol MDI.  Advised to remain out of work until he is afebrile without antipyretics for  24 hours.  Work note for Monday written and sent via mychart.

## 2022-03-11 ENCOUNTER — Telehealth: Payer: Self-pay

## 2022-03-11 ENCOUNTER — Other Ambulatory Visit: Payer: Self-pay | Admitting: Internal Medicine

## 2022-03-11 MED ORDER — LEVALBUTEROL TARTRATE 45 MCG/ACT IN AERO: 1.0000 | INHALATION_SPRAY | Freq: Four times a day (QID) | RESPIRATORY_TRACT | 12 refills | Status: DC | PRN

## 2022-03-11 NOTE — Telephone Encounter (Signed)
Silverio Lay KeyAdriana Mccallum - PA Case ID: [id number] - Rx #: [id number] Need help? Call us at [phone] Status Sent to [facility] Levalbuterol Tartrate 45MCG/ACT [place]

## 2022-03-12 NOTE — Telephone Encounter (Signed)
Silverio Lay KeyAdriana Mccallum - PA Case ID: [id number] - Rx #: [id number] Need help? Call us at [phone] Outcome Deniedtoday We cover this drug when our criteria are met. The unmet criteria are: has tried generic albuterol HFA and Ventolin HFA. This decision was from [employer] Pharm[facility] Coverage Policy. Drug Levalbuterol Tartrate 45MCG/ACT aerosol Sales promotion account executive Info [phone]

## 2022-03-24 ENCOUNTER — Other Ambulatory Visit: Payer: Self-pay | Admitting: Internal Medicine

## 2022-03-24 MED ORDER — HYDROCOD POLI-CHLORPHE POLI ER 10-8 MG/5ML PO SUER: 5.0000 mL | Freq: Two times a day (BID) | ORAL | 0 refills | Status: DC | PRN

## 2022-03-24 MED ORDER — PREDNISONE 10 MG PO TABS: ORAL_TABLET | ORAL | 0 refills | Status: DC

## 2022-03-31 ENCOUNTER — Other Ambulatory Visit: Payer: Self-pay | Admitting: Internal Medicine

## 2022-03-31 MED ORDER — PREDNISONE 10 MG PO TABS: ORAL_TABLET | ORAL | 0 refills | Status: DC

## 2022-03-31 MED ORDER — HYDROCOD POLI-CHLORPHE POLI ER 10-8 MG/5ML PO SUER: 5.0000 mL | Freq: Two times a day (BID) | ORAL | 0 refills | Status: DC | PRN

## 2022-04-01 DIAGNOSIS — H2511 Age-related nuclear cataract, right eye: Secondary | ICD-10-CM | POA: Diagnosis not present

## 2022-04-02 DIAGNOSIS — R059 Cough, unspecified: Secondary | ICD-10-CM | POA: Diagnosis not present

## 2022-04-02 DIAGNOSIS — J329 Chronic sinusitis, unspecified: Secondary | ICD-10-CM | POA: Diagnosis not present

## 2022-04-02 DIAGNOSIS — J340 Abscess, furuncle and carbuncle of nose: Secondary | ICD-10-CM | POA: Diagnosis not present

## 2022-04-03 NOTE — Telephone Encounter (Signed)
MyChart messgae sent to patient. 

## 2022-04-09 ENCOUNTER — Other Ambulatory Visit: Payer: Self-pay | Admitting: Internal Medicine

## 2022-04-09 MED ORDER — AMOXICILLIN-POT CLAVULANATE 875-125 MG PO TABS: 1.0000 | ORAL_TABLET | Freq: Two times a day (BID) | ORAL | 0 refills | Status: DC

## 2022-04-30 ENCOUNTER — Other Ambulatory Visit: Payer: Self-pay | Admitting: Dermatology

## 2022-05-16 ENCOUNTER — Ambulatory Visit: Payer: Medicare PPO | Admitting: Family Medicine

## 2022-05-16 ENCOUNTER — Ambulatory Visit (INDEPENDENT_AMBULATORY_CARE_PROVIDER_SITE_OTHER): Payer: Medicare PPO

## 2022-05-16 VITALS — BP 116/68 | HR 61 | Ht 66.0 in | Wt 157.0 lb

## 2022-05-16 DIAGNOSIS — R29898 Other symptoms and signs involving the musculoskeletal system: Secondary | ICD-10-CM | POA: Diagnosis not present

## 2022-05-16 DIAGNOSIS — H8110 Benign paroxysmal vertigo, unspecified ear: Secondary | ICD-10-CM | POA: Diagnosis not present

## 2022-05-16 DIAGNOSIS — R293 Abnormal posture: Secondary | ICD-10-CM

## 2022-05-16 DIAGNOSIS — M545 Low back pain, unspecified: Secondary | ICD-10-CM | POA: Diagnosis not present

## 2022-05-16 DIAGNOSIS — M25551 Pain in right hip: Secondary | ICD-10-CM | POA: Diagnosis not present

## 2022-05-16 DIAGNOSIS — M25552 Pain in left hip: Secondary | ICD-10-CM | POA: Diagnosis not present

## 2022-05-16 NOTE — Progress Notes (Signed)
I, Tony Cox, LAT, ATC acting as a scribe for Tony Leader, MD.  Subjective:    CC: Postural issues  HPI: Pt is a 72 y/o male c/o postural issues. Pt notes he feels like his torso leans to the L. Pt notes a hx of surgery from bladder cancer and has hernias. Pt locates area across his hips and lower abdomin.  He has tried some physical therapy and independent physical therapy location in the [facility] area.  This was mostly focused on his upper back and not on his pelvis and hip strength.  Pt also c/o neck pain x 4wks. Pt locates pain to the L-side of his neck and into L trapz.  Additionally he notes considerable vertigo.  He notes this has been ongoing for years and has never been addressed.  He experiences brief intense episodes of a room spinning vertiginous sensation when he changes position.  He will experience this especially with laying back from a sitting position or rolling over in bed.  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   Objective:    Vitals:   05/16/22 1530  BP: 116/68  Pulse: 61  SpO2: 98%   General: Well Developed, well nourished, and in no acute distress.   MSK: L-spine: Nontender to palpation spinal midline.  Decreased lumbar motion.  Lower extremity strength decreased hip strength to hip abduction and external rotation bilaterally. Abdomen patient has a urostomy bag on the right side and the abdomen is generally distended with poor abdominal tone. Hips bilaterally normal motion and nontender.  Hip abduction and external rotation strength is diminished 4/5 bilaterally. Patient has an effective leg length discrepancy with the right leg being approximately 0.5 cm shorter than the left.  Neuro: Patient experience intense vertigo for approximately 30 seconds with laying down and rolling over positioning.  Lab and Radiology Results  X-ray  images lumbar spine and bilateral hips obtained today personally and independently interpreted  L-spine: No apparent scoliosis.  Mild degenerative changes present facet joints L4-5 and L5-S1.  No acute fractures are visible.  No aggressive appearing bony lesions are visible.  Bilateral hips: Mild left femoral acetabular DJD.  No acute fractures are visible.  No aggressive appearing bony lesions are present.  Await formal radiology review   Impression and Recommendations:    Assessment and Plan: 72 y.o. male with postural instability secondary to abdominal wall weakness following his extensive abdominal surgeries and poor hip muscular control.  He also has an effective leg length discrepancy where the right leg is shorter than the left.  This is all causing a affective pelvic tilt where the left side of his pelvic is being forced upwards and he is unable to compensate effectively due to his relative core and hip weakness.  Some of this is going to be correctable in some of it will not..  I think with a dedicated course of hip abduction strengthening he will regain quite a lot of his hip and pelvis stability.  I think there is some ground to be gained with core strengthening but I think a lot of that abdominal muscle tone is not going to get much better. Additionally correcting the leg length difference with a small lift in the right shoe should help as well. Plan to refer to physical therapy to focus on hip strengthening and core strengthening.  As for the vertigo he has what looks like  BPPV type.  This was not the main reason for visit but we had to pause multiple times during the exam to let the vertigo episodes pass and it clearly is bothering him.  I think it may be putting him at risk for falls and should be addressed in my opinion.  Will refer to a physical therapy location that can do neurorehab specifically for the vertigo and for hip strengthening.  Refer to [facility] physical  therapy.  Recheck in 6 weeks.  PDMP not reviewed this encounter. Orders Placed This Encounter  Procedures   DG Lumbar Spine 2-3 Views    Standing Status:   Future    Number of Occurrences:   1    Standing Expiration Date:   06/16/2022    Order Specific Question:   Reason for Exam (SYMPTOM  OR DIAGNOSIS REQUIRED)    Answer:   postural    Order Specific Question:   Preferred imaging location?    Answer:   [facility]   DG HIPS BILAT W OR W/O PELVIS MIN 5 VIEWS    Standing Status:   Future    Number of Occurrences:   1    Standing Expiration Date:   05/17/2023    Order Specific Question:   Reason for Exam (SYMPTOM  OR DIAGNOSIS REQUIRED)    Answer:   postural imbalance    Order Specific Question:   Preferred imaging location?    Answer:   [facility]   Ambulatory referral to Physical Therapy    Referral Priority:   Routine    Referral Type:   Physical Medicine    Referral Reason:   Specialty Services Required    Requested Specialty:   Physical Therapy    Number of Visits Requested:   1   No orders of the defined types were placed in this encounter.   Discussed warning signs or symptoms. Please see discharge instructions. Patient expresses understanding.   The above documentation has been reviewed and is accurate and complete Tony Cox, M.D.

## 2022-05-16 NOTE — Patient Instructions (Addendum)
Thank you for coming in today.   Please get an Xray today before you leave   I've referred you to Physical Therapy.  Let us know if you don't hear from them in one week.   That should help with the posture and hip weakness.   That should also help with the vertigo I think is BPPV.  Use a small lift in your shoe.   Recheck in 6 weeks.

## 2022-05-20 NOTE — Progress Notes (Signed)
Bilateral hip x-ray shows some mild arthritis.

## 2022-05-20 NOTE — Progress Notes (Signed)
Lumbar spine x-ray looks pretty normal.

## 2022-05-27 DIAGNOSIS — I340 Nonrheumatic mitral (valve) insufficiency: Secondary | ICD-10-CM | POA: Diagnosis not present

## 2022-05-27 DIAGNOSIS — Z87891 Personal history of nicotine dependence: Secondary | ICD-10-CM | POA: Diagnosis not present

## 2022-05-27 DIAGNOSIS — Z8551 Personal history of malignant neoplasm of bladder: Secondary | ICD-10-CM | POA: Diagnosis not present

## 2022-05-27 DIAGNOSIS — Z8546 Personal history of malignant neoplasm of prostate: Secondary | ICD-10-CM | POA: Diagnosis not present

## 2022-05-27 DIAGNOSIS — Z9221 Personal history of antineoplastic chemotherapy: Secondary | ICD-10-CM | POA: Diagnosis not present

## 2022-05-29 ENCOUNTER — Ambulatory Visit (INDEPENDENT_AMBULATORY_CARE_PROVIDER_SITE_OTHER): Payer: Medicare PPO | Admitting: Dermatology

## 2022-05-29 VITALS — BP 115/66 | HR 66

## 2022-05-29 DIAGNOSIS — Z79899 Other long term (current) drug therapy: Secondary | ICD-10-CM | POA: Diagnosis not present

## 2022-05-29 DIAGNOSIS — L309 Dermatitis, unspecified: Secondary | ICD-10-CM

## 2022-05-29 DIAGNOSIS — Z7189 Other specified counseling: Secondary | ICD-10-CM | POA: Diagnosis not present

## 2022-05-29 DIAGNOSIS — L738 Other specified follicular disorders: Secondary | ICD-10-CM | POA: Diagnosis not present

## 2022-05-29 DIAGNOSIS — L820 Inflamed seborrheic keratosis: Secondary | ICD-10-CM | POA: Diagnosis not present

## 2022-05-29 DIAGNOSIS — L280 Lichen simplex chronicus: Secondary | ICD-10-CM

## 2022-05-29 DIAGNOSIS — L988 Other specified disorders of the skin and subcutaneous tissue: Secondary | ICD-10-CM

## 2022-05-29 DIAGNOSIS — L719 Rosacea, unspecified: Secondary | ICD-10-CM | POA: Diagnosis not present

## 2022-05-29 MED ORDER — METRONIDAZOLE 1 % EX GEL: CUTANEOUS | 4 refills | Status: DC

## 2022-05-29 MED ORDER — MOMETASONE FUROATE 0.1 % EX CREA: TOPICAL_CREAM | CUTANEOUS | 2 refills | Status: DC

## 2022-05-29 NOTE — Progress Notes (Unsigned)
Follow-Up Visit   Subjective  Tony Cox is a 72 y.o. male who presents for the following: Follow-up (Scalp. Hx of ISK's Tx with Ln2 at last visit.) and Rosacea (Face. Needs refills of Metronidazole cream and Mometasone cream). The patient has spots, moles and lesions to be evaluated, some may be new or changing and the patient has concerns that these could be cancer.  The following portions of the chart were reviewed this encounter and updated as appropriate:  Tobacco  Allergies  Meds  Problems  Med Hx  Surg Hx  Fam Hx     Review of Systems: No other skin or systemic complaints except as noted in HPI or Assessment and Plan.  Objective  Well appearing patient in no apparent distress; mood and affect are within normal limits.  A focused examination was performed including head, including the scalp, face, neck, nose, ears, eyelids, and lips. Relevant physical exam findings are noted in the Assessment and Plan.  Mild xerosis  Scalp x6 (6) Erythematous keratotic or waxy stuck-on papule or plaque.  face Mild macular erythema  left cheek x1 Small yellow papule with a central dell.   face Rhytides and volume loss.   Assessment & Plan  Inflamed seborrheic keratosis (6) Scalp x6 Symptomatic, irritating, patient would like treated. Destruction of lesion - Scalp x6 Complexity: simple   Destruction method: cryotherapy   Informed consent: discussed and consent obtained   Timeout:  patient name, date of birth, surgical site, and procedure verified Lesion destroyed using liquid nitrogen: Yes   Region frozen until ice ball extended beyond lesion: Yes   Outcome: patient tolerated procedure well with no complications   Post-procedure details: wound care instructions given   Additional details:  Prior to procedure, discussed risks of blister formation, small wound, skin dyspigmentation, or rare scar following cryotherapy. Recommend Vaseline ointment to treated areas while  healing.   Rosacea face Rosacea is a chronic progressive skin condition usually affecting the face of adults, causing redness and/or acne bumps. It is treatable but not curable. It sometimes affects the eyes (ocular rosacea) as well. It may respond to topical and/or systemic medication and can flare with stress, sun exposure, alcohol, exercise, topical steroids (including hydrocortisone/cortisone 10) and some foods.  Daily application of broad spectrum spf 30+ sunscreen to face is recommended to reduce flares.  Continue Metronidazole gel at bedtime to face.   Related Medications metroNIDAZOLE (METROGEL) 1 % gel APPLY TOPICALLY AT BEDTIME TO FACE FOR ROSACEA  Sebaceous hyperplasia left cheek x1 irritated Benign-appearing.  Observation.  Call clinic for new or changing lesions.  Recommend daily use of broad spectrum spf 30+ sunscreen to sun-exposed areas.  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 Condition is symptomatic / bothersome to patient. Not to goal. His urologist has him on oral Gaba Pentin for this. Continue skin Medicinals Itch medication prn.   May also use Mometasone cream twice daily as needed up to 4 days per week for rash -- do not use if no rash.  Related Medications mometasone (ELOCON) 0.1 % cream APPLY TO AFFECTED AREAS TWICE DAILY AS  NEEDED up to 4 days per week FOR RASH  Elastosis of skin face Discussed Botox. Discussed Halo laser vs Moxi.  Recommend Lind Covert in [place] for laser / Halo  vs Moxi since the docs are not doing Halo here at [facility]  (just doing BBL right now).  No follow-ups on file.  I, Emelia Salisbury, CMA, am acting as scribe for Sarina Ser, MD. Documentation: I have reviewed the above documentation for accuracy and completeness, and I agree with the above.  Sarina Ser, MD

## 2022-05-29 NOTE — Patient Instructions (Addendum)
Continue Metronidazole gel at bedtime to face.    Continue Mometasone cream twice daily as needed for rash.    Cryotherapy Aftercare  Wash gently with soap and water everyday.   Apply Vaseline daily until healed.   Discuss HALO with Lind Covert.   Due to recent changes in healthcare laws, you may see results of your pathology and/or laboratory studies on MyChart before the doctors have had a chance to review them. We understand that in some cases there may be results that are confusing or concerning to you. Please understand that not all results are received at the same time and often the doctors may need to interpret multiple results in order to provide you with the best plan of care or course of treatment. Therefore, we ask that you please give [place] 2 business days to thoroughly review all your results before contacting the office for clarification. Should we see a critical lab result, you will be contacted sooner.   If You Need Anything After Your Visit  If you have any questions or concerns for your doctor, please call our main line at [phone] and press option 4 to reach your doctor's medical assistant. If no one answers, please leave a voicemail as directed and we will return your call as soon as possible. Messages left after 4 pm will be answered the following business day.   You may also send [place] a message via [place]. We typically respond to MyChart messages within 1-2 business days.  For prescription refills, please ask your pharmacy to contact our office. Our fax number is [phone].  If you have an urgent issue when the clinic is closed that cannot wait until the next business day, you can page your doctor at the number below.    Please note that while we do our best to be available for urgent issues outside of office hours, we are not available 24/7.   If you have an urgent issue and are unable to reach [place], you may choose to seek medical care at your doctor's office,  retail clinic, urgent care center, or emergency room.  If you have a medical emergency, please immediately call 911 or go to the emergency department.  Pager Numbers  - Dr. Nehemiah Massed: [phone]  - Dr. Laurence Ferrari: [phone]  - Dr. Nicole Kindred: [phone]  In the event of inclement weather, please call our main line at [phone] for an update on the status of any delays or closures.  Dermatology Medication Tips: Please keep the boxes that topical medications come in in order to help keep track of the instructions about where and how to use these. Pharmacies typically print the medication instructions only on the boxes and not directly on the medication tubes.   If your medication is too expensive, please contact our office at [phone] option 4 or send [place] a message through [place].   We are unable to tell what your co-pay for medications will be in advance as this is different depending on your insurance coverage. However, we may be able to find a substitute medication at lower cost or fill out paperwork to get insurance to cover a needed medication.   If a prior authorization is required to get your medication covered by your insurance company, please allow [place] 1-2 business days to complete this process.  Drug prices often vary depending on where the prescription is filled and some pharmacies may offer cheaper prices.  The website www.goodrx.com contains coupons for medications through different pharmacies. The prices  here do not account for what the cost may be with help from insurance (it may be cheaper with your insurance), but the website can give you the price if you did not use any insurance.  - You can print the associated coupon and take it with your prescription to the pharmacy.  - You may also stop by our office during regular business hours and pick up a GoodRx coupon card.  - If you need your prescription sent electronically to a different pharmacy, notify our office through  [facility] or by phone at [phone] option 4.     Si Usted Necesita Algo Despus de Su Visita  Tambin puede enviarnos un mensaje a travs de Pharmacist, community. Por lo general respondemos a los mensajes de MyChart en el transcurso de 1 a 2 das hbiles.  Para renovar recetas, por favor pida a su farmacia que se ponga en contacto con nuestra oficina. Harland Dingwall de fax es [place] [phone].  Si tiene un asunto urgente cuando la clnica est cerrada y que no puede esperar hasta el siguiente da hbil, puede llamar/localizar a su doctor(a) al nmero que aparece a continuacin.   Por favor, tenga en cuenta que aunque hacemos todo lo posible para estar disponibles para asuntos urgentes fuera del horario de [place], no estamos disponibles las 24 horas del da, los 7 das de la [place].   Si tiene un problema urgente y no puede comunicarse con nosotros, puede optar por buscar atencin mdica  en el consultorio de su doctor(a), en una clnica privada, en un centro de atencin urgente o en una sala de emergencias.  Si tiene Engineering geologist, por favor llame inmediatamente al 911 o vaya a la sala de emergencias.  Nmeros de bper  - Dr. Nehemiah Massed: [phone]  - Dra. Moye: [phone]  - Dra. Nicole Kindred: [phone]  En caso de inclemencias del [place], por favor llame a Johnsie Kindred principal al [phone] para una actualizacin sobre el [place] de cualquier retraso o cierre.  Consejos para la medicacin en dermatologa: Por favor, guarde las cajas en las que vienen los medicamentos de uso tpico para ayudarle a seguir las instrucciones sobre dnde y cmo usarlos. Las farmacias generalmente imprimen las instrucciones del medicamento slo en las cajas y no directamente en los tubos del [place].   Si su medicamento es muy caro, por favor, pngase en contacto con Zigmund Daniel llamando al [phone] y presione la opcin 4 o envenos un mensaje a travs de Pharmacist, community.   No podemos  decirle cul ser su copago por los medicamentos por adelantado ya que esto es diferente dependiendo de la cobertura de su seguro. Sin embargo, es posible que podamos encontrar un medicamento sustituto a Electrical engineer un formulario para que el seguro cubra el medicamento que se considera necesario.   Si se requiere una autorizacin previa para que su compaa de seguros [place] su medicamento, por favor permtanos de 1 a 2 das hbiles para completar este proceso.  Los precios de los medicamentos varan con frecuencia dependiendo del Environmental consultant de dnde se surte la receta y alguna farmacias pueden ofrecer precios ms baratos.  El sitio web www.goodrx.com tiene cupones para medicamentos de Airline pilot. Los precios aqu no tienen en cuenta lo que podra costar con la ayuda del seguro (puede ser ms barato con su seguro), pero el sitio web puede darle el precio si no utiliz Research scientist (physical sciences).  - Puede imprimir el cupn correspondiente y llevarlo con su receta a la  farmacia.  - Tambin puede pasar por nuestra oficina durante el horario de atencin regular y Charity fundraiser una tarjeta de cupones de GoodRx.  - Si necesita que su receta se enve electrnicamente a una farmacia diferente, informe a nuestra oficina a travs de MyChart de [place] o por telfono llamando al [phone] y presione la opcin 4.

## 2022-05-30 ENCOUNTER — Other Ambulatory Visit: Payer: Self-pay

## 2022-05-30 ENCOUNTER — Encounter: Payer: Self-pay | Admitting: Dermatology

## 2022-05-30 DIAGNOSIS — L309 Dermatitis, unspecified: Secondary | ICD-10-CM

## 2022-05-30 MED ORDER — MOMETASONE FUROATE 0.1 % EX CREA: TOPICAL_CREAM | CUTANEOUS | 2 refills | Status: DC

## 2022-05-30 NOTE — Addendum Note (Signed)
Addended by: Emelia Salisbury on: 05/30/2022 01:56 PM   Modules accepted: Orders

## 2022-05-30 NOTE — Progress Notes (Signed)
Corrected Rx for Mometasone sent to pharmacy.

## 2022-06-08 ENCOUNTER — Other Ambulatory Visit: Payer: Self-pay | Admitting: Internal Medicine

## 2022-06-10 ENCOUNTER — Ambulatory Visit: Payer: Medicare PPO | Admitting: Physical Therapy

## 2022-06-12 DIAGNOSIS — Z936 Other artificial openings of urinary tract status: Secondary | ICD-10-CM | POA: Diagnosis not present

## 2022-06-21 DIAGNOSIS — R7303 Prediabetes: Secondary | ICD-10-CM | POA: Diagnosis not present

## 2022-06-21 DIAGNOSIS — Z7189 Other specified counseling: Secondary | ICD-10-CM | POA: Diagnosis not present

## 2022-06-26 DIAGNOSIS — J209 Acute bronchitis, unspecified: Secondary | ICD-10-CM | POA: Diagnosis not present

## 2022-06-26 DIAGNOSIS — R059 Cough, unspecified: Secondary | ICD-10-CM | POA: Diagnosis not present

## 2022-06-27 ENCOUNTER — Ambulatory Visit: Payer: Medicare PPO | Admitting: Family Medicine

## 2022-06-27 DIAGNOSIS — R079 Chest pain, unspecified: Secondary | ICD-10-CM | POA: Diagnosis not present

## 2022-06-27 DIAGNOSIS — Z8551 Personal history of malignant neoplasm of bladder: Secondary | ICD-10-CM | POA: Diagnosis not present

## 2022-06-27 DIAGNOSIS — I10 Essential (primary) hypertension: Secondary | ICD-10-CM | POA: Diagnosis not present

## 2022-06-27 DIAGNOSIS — R0602 Shortness of breath: Secondary | ICD-10-CM | POA: Diagnosis not present

## 2022-06-27 DIAGNOSIS — Z87891 Personal history of nicotine dependence: Secondary | ICD-10-CM | POA: Diagnosis not present

## 2022-06-27 DIAGNOSIS — R051 Acute cough: Secondary | ICD-10-CM | POA: Diagnosis not present

## 2022-06-27 DIAGNOSIS — Z936 Other artificial openings of urinary tract status: Secondary | ICD-10-CM | POA: Diagnosis not present

## 2022-06-27 DIAGNOSIS — R059 Cough, unspecified: Secondary | ICD-10-CM | POA: Diagnosis not present

## 2022-06-27 DIAGNOSIS — E860 Dehydration: Secondary | ICD-10-CM | POA: Diagnosis not present

## 2022-06-27 DIAGNOSIS — I483 Typical atrial flutter: Secondary | ICD-10-CM | POA: Diagnosis not present

## 2022-06-27 DIAGNOSIS — G4700 Insomnia, unspecified: Secondary | ICD-10-CM | POA: Diagnosis not present

## 2022-06-27 DIAGNOSIS — J189 Pneumonia, unspecified organism: Secondary | ICD-10-CM | POA: Diagnosis not present

## 2022-06-27 DIAGNOSIS — R000 Tachycardia, unspecified: Secondary | ICD-10-CM | POA: Diagnosis not present

## 2022-06-27 DIAGNOSIS — R0600 Dyspnea, unspecified: Secondary | ICD-10-CM | POA: Diagnosis not present

## 2022-06-27 DIAGNOSIS — I2699 Other pulmonary embolism without acute cor pulmonale: Secondary | ICD-10-CM | POA: Diagnosis not present

## 2022-06-27 DIAGNOSIS — I4892 Unspecified atrial flutter: Secondary | ICD-10-CM | POA: Diagnosis not present

## 2022-06-27 DIAGNOSIS — Z952 Presence of prosthetic heart valve: Secondary | ICD-10-CM | POA: Diagnosis not present

## 2022-06-27 DIAGNOSIS — K219 Gastro-esophageal reflux disease without esophagitis: Secondary | ICD-10-CM | POA: Diagnosis not present

## 2022-06-27 DIAGNOSIS — Z20822 Contact with and (suspected) exposure to covid-19: Secondary | ICD-10-CM | POA: Diagnosis not present

## 2022-07-02 ENCOUNTER — Telehealth: Payer: Self-pay | Admitting: *Deleted

## 2022-07-02 DIAGNOSIS — I483 Typical atrial flutter: Secondary | ICD-10-CM | POA: Diagnosis not present

## 2022-07-02 DIAGNOSIS — Z8679 Personal history of other diseases of the circulatory system: Secondary | ICD-10-CM | POA: Diagnosis not present

## 2022-07-02 DIAGNOSIS — R051 Acute cough: Secondary | ICD-10-CM | POA: Diagnosis not present

## 2022-07-02 DIAGNOSIS — J180 Bronchopneumonia, unspecified organism: Secondary | ICD-10-CM | POA: Diagnosis not present

## 2022-07-02 NOTE — Transitions of Care (Post Inpatient/ED Visit) (Signed)
   07/02/2022  Name: Tony Cox MRN: [id number] DOB: May 03, 1950  Today's TOC FU Call Status: Today's TOC FU Call Status:: Successful TOC FU Call Competed TOC FU Call Complete Date: 07/02/22  Transition Care Management Follow-up Telephone Call Date of Discharge: 07/01/22 Discharge Facility: Other ([facility]) Name of Other ([facility]) Discharge Facility: [facility] Type of Discharge: Inpatient Admission Primary Inpatient Discharge Diagnosis:: atrial flutter / pneumonia How have you been since you were released from the hospital?: Better Any questions or concerns?: No  Items Reviewed: Did you receive and understand the discharge instructions provided?: Yes Medications obtained and verified?: Yes (Medications Reviewed) Any new allergies since your discharge?: No Dietary orders reviewed?: No Do you have support at home?: Yes People in Home: significant other Name of Support/Comfort Primary Source: [facility] and Equipment/Supplies: [employer] Ordered?: No Any new equipment or medical supplies ordered?: No  Functional Questionnaire: Do you need assistance with bathing/showering or dressing?: No Do you need assistance with meal preparation?: Yes Do you need assistance with eating?: No Do you have difficulty maintaining continence: No Do you need assistance with getting out of bed/getting out of a chair/moving?: No Do you have difficulty managing or taking your medications?: No  Folllow up appointments reviewed: PCP Follow-up appointment confirmed?: No (Patient wants to schedule PCP himself) MD Provider Line Number:[phone] Given: No Specialist Hospital Follow-up appointment confirmed?: Yes Date of Specialist follow-up appointment?: 07/02/22 Follow-Up Specialty Provider:: Cardiologist Do you need transportation to your follow-up appointment?: No Do you understand care options if your condition(s) worsen?: Yes-patient verbalized  understanding  SDOH Interventions Today    Flowsheet Row Most Recent Value  SDOH Interventions   Food Insecurity Interventions Intervention Not Indicated  Housing Interventions Intervention Not Indicated      Interventions Today    Flowsheet Row Most Recent Value  General Interventions   General Interventions Discussed/Reviewed General Interventions Discussed, General Interventions Reviewed, Doctor Visits  [facility] discussed patient scheduling a f/u visit with PCP.]  Doctor Visits Discussed/Reviewed Specialist, PCP  PCP/Specialist Visits Compliance with follow-up visit  Pharmacy Interventions   Pharmacy Dicussed/Reviewed Pharmacy Topics Discussed, Pharmacy Topics Reviewed  [RN made sure patient has all his medications]        [facility] [phone]

## 2022-07-05 ENCOUNTER — Encounter: Payer: Self-pay | Admitting: Internal Medicine

## 2022-07-05 ENCOUNTER — Ambulatory Visit: Payer: Medicare PPO | Admitting: Internal Medicine

## 2022-07-05 VITALS — BP 110/70 | HR 72 | Temp 97.5°F | Resp 17 | Ht 66.0 in | Wt 152.5 lb

## 2022-07-05 DIAGNOSIS — Z8679 Personal history of other diseases of the circulatory system: Secondary | ICD-10-CM

## 2022-07-05 DIAGNOSIS — J189 Pneumonia, unspecified organism: Secondary | ICD-10-CM

## 2022-07-05 DIAGNOSIS — L309 Dermatitis, unspecified: Secondary | ICD-10-CM | POA: Diagnosis not present

## 2022-07-05 DIAGNOSIS — Z09 Encounter for follow-up examination after completed treatment for conditions other than malignant neoplasm: Secondary | ICD-10-CM

## 2022-07-05 DIAGNOSIS — Z8701 Personal history of pneumonia (recurrent): Secondary | ICD-10-CM | POA: Diagnosis not present

## 2022-07-05 MED ORDER — LEVALBUTEROL TARTRATE 45 MCG/ACT IN AERO: 1.0000 | INHALATION_SPRAY | Freq: Four times a day (QID) | RESPIRATORY_TRACT | 12 refills | Status: DC | PRN

## 2022-07-05 MED ORDER — MOMETASONE FUROATE 0.1 % EX CREA: TOPICAL_CREAM | CUTANEOUS | 2 refills | Status: DC

## 2022-07-05 NOTE — Patient Instructions (Addendum)
Increase zyrtec to every 12 hours  Add flonase and prn afrin (max 5 days)

## 2022-07-05 NOTE — Progress Notes (Unsigned)
Subjective:  Patient ID: Tony Cox, male    DOB: [birth year]  Age: 72 y.o. MRN: [id number]  CC: There were no encounter diagnoses.   HPI Tony Cox presents for  Chief Complaint  Patient presents with   Hospitalization Follow-up    Was diagnosed with double pneumonia & flu-feels fair today; non-fasting   Treated for bronchitis by ENT  2 weeks ago by ENT with Biaxin,  no improvement ,  treated last Wednesday at [facility] and referred to [facility] ER for penumonia and despite being relative hypoxic with room air sats 91%. He was given augmentin and discharged from ER.  Struggled on Thurs and Friday  at home., progressively weaker, developed chest heaviness and presyncope on Saturday  with tachycardia, returned to [facility]   and was Admitted to [facility] on March 9 with atrial flutter HR 130's 2:1 pattern .  Treated in ICU for  bilateral LL pneumonia , atrial flutter,  dehydration and discharged on March 11,  (Monday) with rx to continue Azithromycin and cefdinir which he has finished.  advised not to use prednisone and albuterol  .ZIO monitor placed prior to discharge.   [facility] cardi[facility] at [facility] ,  Dr  Posey Pronto:   labs done EKG sinus rhythm.  Given Atrovent MDI  Currently has returned to work for the last 2 days, new onset congestion and sneezing  and cough.  Taking zyrtec once daily  .     Outpatient Medications Prior to Visit  Medication Sig Dispense Refill   COREG CR 10 MG 24 hr capsule Take 1 capsule (10 mg total) by mouth daily. 90 capsule 3   DEXILANT 60 MG capsule TAKE 1 CAPSULE EVERY DAY 90 capsule 0   dicyclomine (BENTYL) 10 MG capsule TAKE 1 CAPSULE TWICE DAILY 180 capsule 0   doxylamine, Sleep, (UNISOM SLEEPTABS) 25 MG tablet Take by mouth.     gabapentin (NEURONTIN) 300 MG capsule TAKE 1 CAPSULE TWICE DAILY 180 capsule 1   mirtazapine (REMERON SOL-TAB) 15 MG disintegrating tablet DISSOLVE 1 TABLET ON THE TONGUE AT BEDTIME 90 tablet 3   mometasone (ELOCON)  0.1 % cream APPLY TO AFFECTED AREAS TWICE DAILY UP TO 4 DAYS A WEEK AS NEEDED FOR RASH. Do not use if no rash present. 45 g 2   Probiotic Product (PROBIOTIC & ACIDOPHILUS EX ST PO) Take 2 tablets by mouth at bedtime as needed.     traMADol (ULTRAM) 50 MG tablet Take 1 tablet (50 mg total) by mouth every 6 (six) hours as needed. 30 tablet 0   valACYclovir (VALTREX) 1000 MG tablet Take 1 tablet (1,000 mg total) by mouth 2 (two) times daily. 30 tablet 11   levalbuterol (XOPENEX HFA) 45 MCG/ACT inhaler Inhale 1-2 puffs into the lungs every 6 (six) hours as needed for wheezing. (Patient not taking: Reported on 07/05/2022) 1 each 12   Clindamycin Phosphate foam APPLY 1 GRAM ON SKIN DAILY  5   desonide (DESOWEN) 0.05 % lotion      fluconazole (DIFLUCAN) 100 MG tablet Take 1 tablet (100 mg total) by mouth daily. X 7 days 7 tablet 0   fluconazole (DIFLUCAN) 200 MG tablet Take 1 tablet (200 mg total) by mouth 3 (three) times a week. For each episode prn 30 tablet 3   hydrocortisone-pramoxine (ANALPRAM-HC) 2.5-1 % rectal cream APPLY A SMALL AMOUNT TWICE  A DAY AS NEEDED 30 g 0   Lidocaine 5 % CREA Apply to aa's QD. (Patient not  taking: Reported on 07/05/2022) 30 g 11   metroNIDAZOLE (METROGEL) 1 % gel APPLY TOPICALLY AT BEDTIME TO FACE FOR ROSACEA (Patient not taking: Reported on 07/05/2022) 60 g 4   mupirocin ointment (BACTROBAN) 2 % APPLY A SMALL AMOUNT TO AFFECTED AREA TWICE DAILY (Patient not taking: Reported on 07/05/2022) 22 g 11   nystatin (MYCOSTATIN/NYSTOP) powder Apply topically 4 (four) times daily. 15 g 4   nystatin (MYCOSTATIN/NYSTOP) powder Apply 1 Application topically 3 (three) times daily. 30 g 0   polyethylene glycol (MIRALAX / GLYCOLAX) packet Take 17 g by mouth as needed.      triamcinolone cream (KENALOG) 0.1 %      No facility-administered medications prior to visit.    Review of Systems;  Patient denies headache, fevers, malaise, unintentional weight loss, skin rash, eye pain, sinus  congestion and sinus pain, sore throat, dysphagia,  hemoptysis , cough, dyspnea, wheezing, chest pain, palpitations, orthopnea, edema, abdominal pain, nausea, melena, diarrhea, constipation, flank pain, dysuria, hematuria, urinary  Frequency, nocturia, numbness, tingling, seizures,  Focal weakness, Loss of consciousness,  Tremor, insomnia, depression, anxiety, and suicidal ideation.      Objective:  BP 110/70   Pulse 72   Temp (!) 97.5 F (36.4 C) (Oral)   Resp 17   Ht 5\' 6"  (1.676 m)   Wt 152 lb 8 oz (69.2 kg)   SpO2 97%   BMI 24.61 kg/m   BP Readings from Last 3 Encounters:  07/05/22 110/70  05/29/22 115/66  05/16/22 116/68    Wt Readings from Last 3 Encounters:  07/05/22 152 lb 8 oz (69.2 kg)  05/16/22 157 lb (71.2 kg)  09/19/21 155 lb 6.4 oz (70.5 kg)    Physical Exam  Lab Results  Component Value Date   HGBA1C 5.7 06/05/2021    Lab Results  Component Value Date   CREATININE 0.93 09/19/2021   CREATININE 0.90 06/05/2021   CREATININE 0.81 01/01/2021    Lab Results  Component Value Date   WBC 8.1 09/19/2021   HGB 14.3 09/19/2021   HCT 42.4 09/19/2021   PLT 224.0 09/19/2021   GLUCOSE 147 (H) 09/19/2021   CHOL 176 06/05/2021   TRIG 129.0 06/05/2021   HDL 54.20 06/05/2021   LDLDIRECT 135.5 01/16/2010   LDLCALC 96 06/05/2021   ALT 23 09/19/2021   AST 22 09/19/2021   NA 138 09/19/2021   K 3.7 09/19/2021   CL 101 09/19/2021   CREATININE 0.93 09/19/2021   BUN 11 09/19/2021   CO2 30 09/19/2021   TSH 1.28 06/05/2021   PSA 2.1 10/19/2012   HGBA1C 5.7 06/05/2021   MICROALBUR 0.7 07/07/2012    No results found.  Assessment & Plan:  .There are no diagnoses linked to this encounter.   I provided 30 minutes of face-to-face time during this encounter reviewing patient's last visit with me, patient's  most recent visit with cardiology,  nephrology,  and neurology,  recent surgical and non surgical procedures, previous  labs and imaging studies, counseling on  currently addressed issues,  and post visit ordering to diagnostics and therapeutics .   Follow-up: No follow-ups on file.   Crecencio Mc, MD

## 2022-07-07 DIAGNOSIS — J189 Pneumonia, unspecified organism: Secondary | ICD-10-CM | POA: Insufficient documentation

## 2022-07-07 DIAGNOSIS — Z09 Encounter for follow-up examination after completed treatment for conditions other than malignant neoplasm: Secondary | ICD-10-CM | POA: Insufficient documentation

## 2022-07-07 DIAGNOSIS — Z8701 Personal history of pneumonia (recurrent): Secondary | ICD-10-CM | POA: Insufficient documentation

## 2022-07-07 NOTE — Assessment & Plan Note (Signed)
In the setting of  dehydration, hypoxic respiratory failure , prednisone and use of albuterol for bronchodilation .  He appears to be in sinus rhythm today and is wearing ZIO monitor placed during [facility] hospitalization March 2024  to evaluate the burden of arrhythmia.   He has followed up with his new cardiologist,  Dr Posey Pronto at [facility]

## 2022-07-07 NOTE — Assessment & Plan Note (Signed)
Patient is stable post discharge from [facility] for aute hypoxic respiratory failure leading to atrial flutter.  All new issues or questions about discharge plans were addressed at the visit today for hospital follow up.  I have reviewed the records from the hospital admission in detail with patient today.

## 2022-07-07 NOTE — Assessment & Plan Note (Addendum)
Bilateral infiltrates noted during recent outpatient workup at Urgent Care  s/p admission to [facility] for management of  acute hypoxic respiratory failure and concurrent stress induced atrial flutter .  Lung exam is clear without,  without egophony.  Reassurance provided that his current symptoms are likely due to season rhinitis, not a recurrence .  He is ambulating well with room air saturation of 96% or higher.

## 2022-07-15 DIAGNOSIS — J189 Pneumonia, unspecified organism: Secondary | ICD-10-CM | POA: Diagnosis not present

## 2022-07-16 DIAGNOSIS — I483 Typical atrial flutter: Secondary | ICD-10-CM | POA: Diagnosis not present

## 2022-07-17 DIAGNOSIS — I483 Typical atrial flutter: Secondary | ICD-10-CM | POA: Diagnosis not present

## 2022-07-19 ENCOUNTER — Telehealth: Payer: Self-pay | Admitting: Pharmacy Technician

## 2022-07-19 NOTE — Telephone Encounter (Signed)
Patient Advocate Encounter  Prior Authorization for  Levalbuterol Tartrate 45MCG/ACT aerosol  has been approved.    PA# [id number] Effective dates: 04/22/22 through 04/22/23

## 2022-07-19 NOTE — Telephone Encounter (Signed)
Patient Advocate Encounter   Received notification that prior authorization for Levalbuterol Tartrate 45MCG/ACT aerosol is required.   PA submitted on 07/19/2022 [facility] Electronic PA Form Status is pending

## 2022-07-23 NOTE — Telephone Encounter (Signed)
noted 

## 2022-08-22 ENCOUNTER — Other Ambulatory Visit: Payer: Self-pay | Admitting: Internal Medicine

## 2022-08-27 ENCOUNTER — Other Ambulatory Visit: Payer: Self-pay | Admitting: Internal Medicine

## 2022-08-27 MED ORDER — ATROVENT HFA 17 MCG/ACT IN AERS: 2.0000 | INHALATION_SPRAY | Freq: Four times a day (QID) | RESPIRATORY_TRACT | 12 refills | Status: DC | PRN

## 2022-09-10 IMAGING — DX DG CHEST 2V
2 series · 2 of 2 positions shown · non-contrast
Comparison: March 04, 2016

CLINICAL DATA: Four-day history of cough.  Chest heaviness.

EXAM:
CHEST - 2 VIEW

[chest pa]
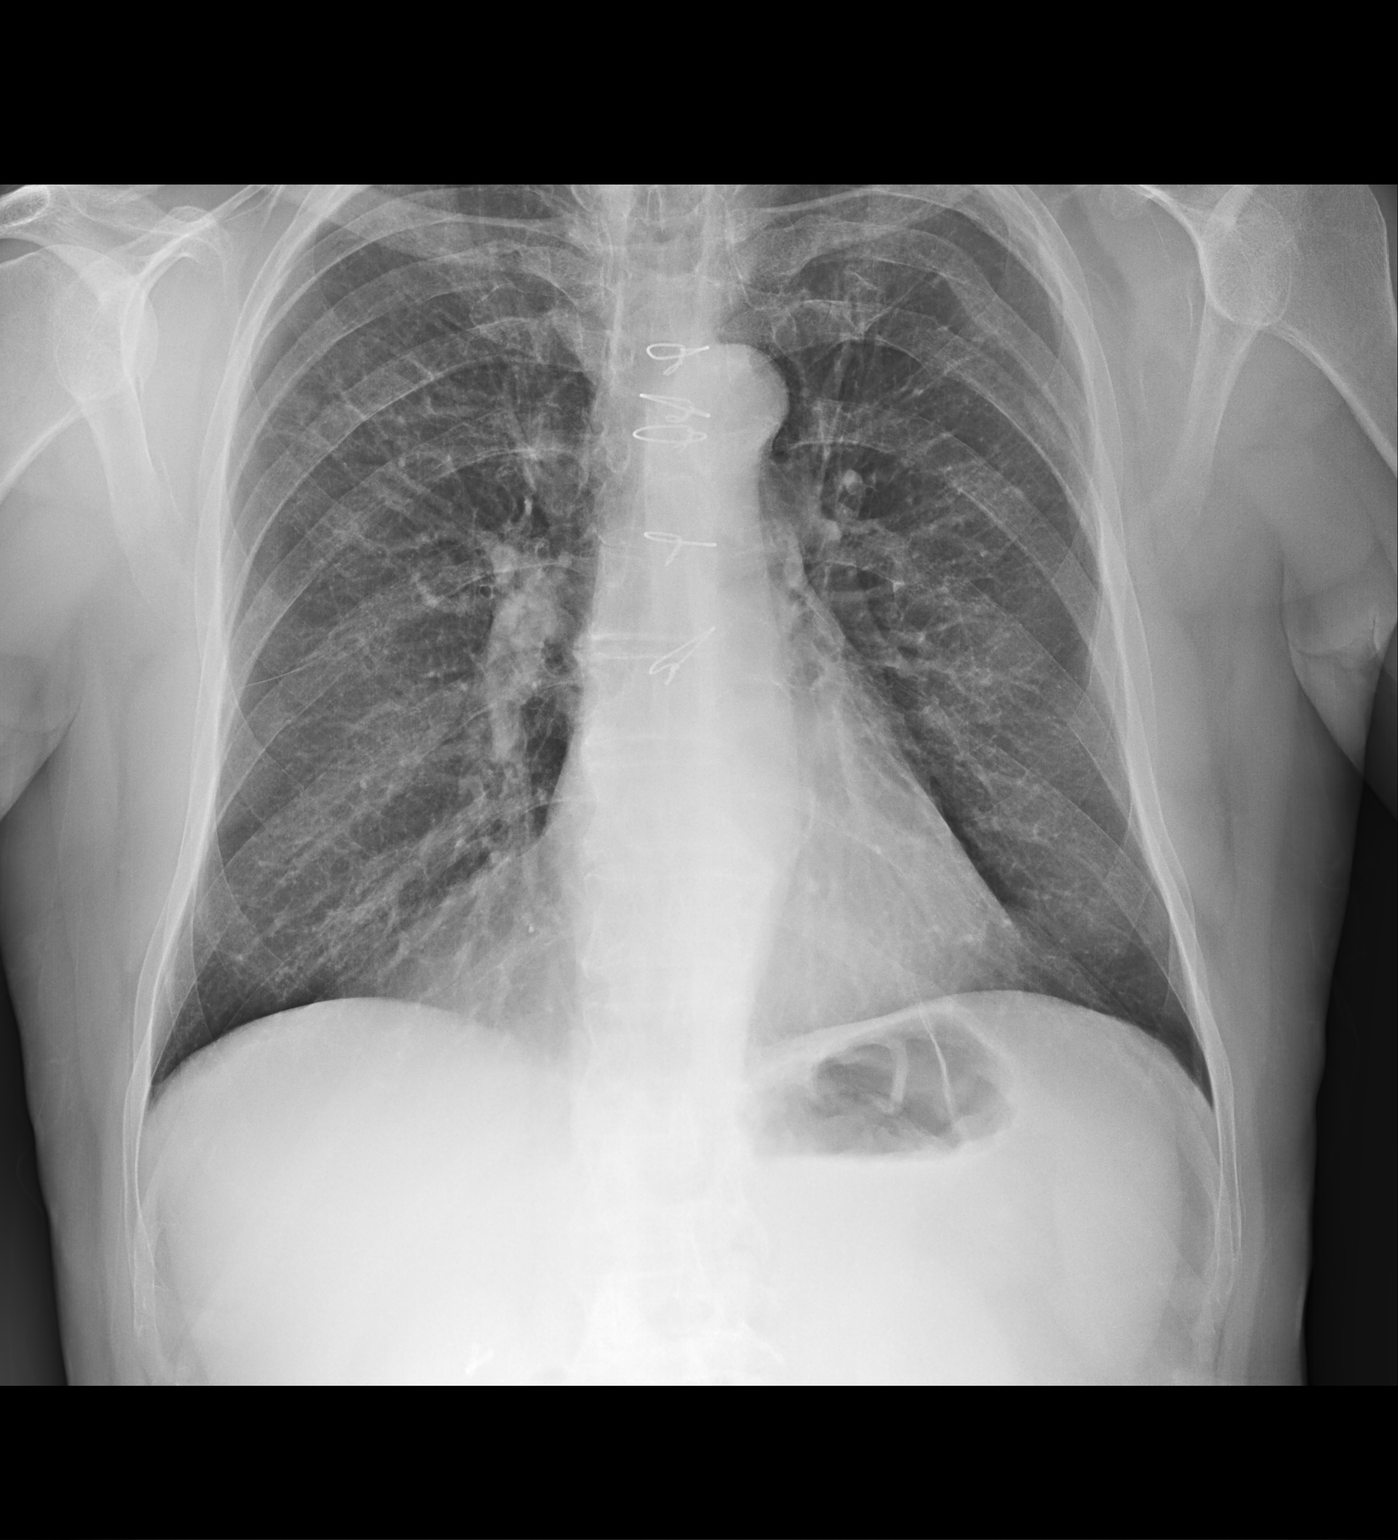

[chest lat]
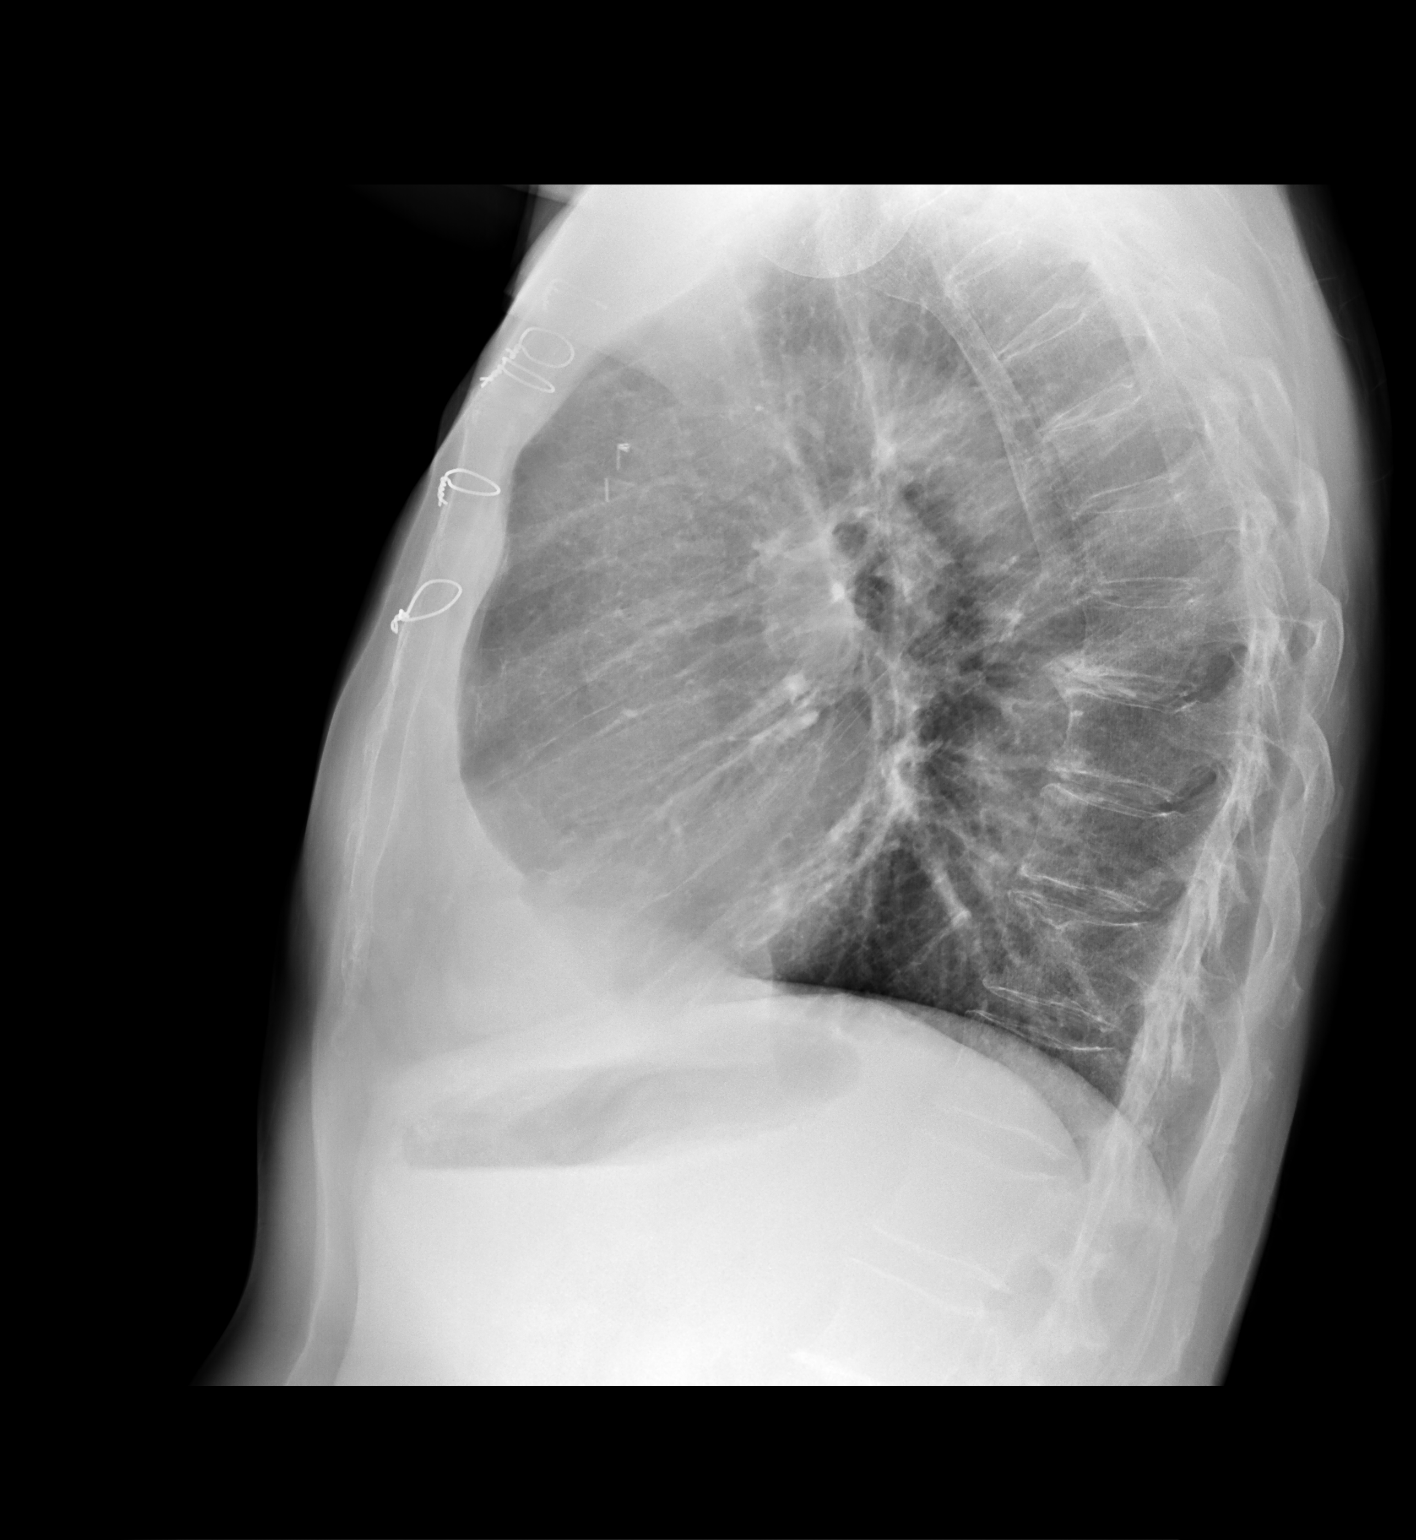

[2 of 2 positions shown; findings below may reference images not displayed]

FINDINGS: Haziness at the right heart border is unchanged since March 04, 2016 consistent with a prominent epicardial fat pad. The heart,
hila, mediastinum are normal. No pneumothorax. The lungs are
otherwise clear. No convincing evidence of pneumonia.
IMPRESSION: No cause for cough identified.

## 2022-09-12 ENCOUNTER — Other Ambulatory Visit: Payer: Self-pay | Admitting: Family

## 2022-09-12 ENCOUNTER — Other Ambulatory Visit: Payer: Self-pay | Admitting: Internal Medicine

## 2022-09-12 MED ORDER — GABAPENTIN 300 MG PO CAPS: ORAL_CAPSULE | ORAL | 1 refills | Status: DC

## 2022-09-30 ENCOUNTER — Other Ambulatory Visit: Payer: Self-pay | Admitting: Internal Medicine

## 2022-09-30 DIAGNOSIS — B349 Viral infection, unspecified: Secondary | ICD-10-CM | POA: Diagnosis not present

## 2022-09-30 DIAGNOSIS — Z20822 Contact with and (suspected) exposure to covid-19: Secondary | ICD-10-CM | POA: Diagnosis not present

## 2022-09-30 MED ORDER — AZITHROMYCIN 500 MG PO TABS
500.0000 mg | ORAL_TABLET | Freq: Every day | ORAL | 0 refills | Status: DC
Start: 2022-09-30 — End: 2022-11-30

## 2022-09-30 MED ORDER — CEFDINIR 300 MG PO CAPS: 300.0000 mg | ORAL_CAPSULE | Freq: Two times a day (BID) | ORAL | 0 refills | Status: DC

## 2022-10-02 DIAGNOSIS — Z87891 Personal history of nicotine dependence: Secondary | ICD-10-CM | POA: Diagnosis not present

## 2022-10-02 DIAGNOSIS — Z8546 Personal history of malignant neoplasm of prostate: Secondary | ICD-10-CM | POA: Diagnosis not present

## 2022-10-02 DIAGNOSIS — I340 Nonrheumatic mitral (valve) insufficiency: Secondary | ICD-10-CM | POA: Diagnosis not present

## 2022-10-02 DIAGNOSIS — Z906 Acquired absence of other parts of urinary tract: Secondary | ICD-10-CM | POA: Diagnosis not present

## 2022-10-02 DIAGNOSIS — Z8551 Personal history of malignant neoplasm of bladder: Secondary | ICD-10-CM | POA: Diagnosis not present

## 2022-10-02 DIAGNOSIS — Z9079 Acquired absence of other genital organ(s): Secondary | ICD-10-CM | POA: Diagnosis not present

## 2022-10-02 DIAGNOSIS — C679 Malignant neoplasm of bladder, unspecified: Secondary | ICD-10-CM | POA: Diagnosis not present

## 2022-10-02 DIAGNOSIS — Z9221 Personal history of antineoplastic chemotherapy: Secondary | ICD-10-CM | POA: Diagnosis not present

## 2022-10-14 DIAGNOSIS — D303 Benign neoplasm of bladder: Secondary | ICD-10-CM | POA: Diagnosis not present

## 2022-10-14 DIAGNOSIS — C61 Malignant neoplasm of prostate: Secondary | ICD-10-CM | POA: Diagnosis not present

## 2022-10-14 DIAGNOSIS — C679 Malignant neoplasm of bladder, unspecified: Secondary | ICD-10-CM | POA: Diagnosis not present

## 2022-11-26 ENCOUNTER — Other Ambulatory Visit: Payer: Self-pay | Admitting: Internal Medicine

## 2022-11-30 ENCOUNTER — Other Ambulatory Visit: Payer: Self-pay | Admitting: Internal Medicine

## 2022-11-30 MED ORDER — PREDNISONE 10 MG PO TABS
ORAL_TABLET | ORAL | 0 refills | Status: DC
Start: 2022-11-30 — End: 2023-01-02

## 2022-11-30 MED ORDER — AZITHROMYCIN 500 MG PO TABS
500.0000 mg | ORAL_TABLET | Freq: Every day | ORAL | 0 refills | Status: DC
Start: 2022-11-30 — End: 2023-01-02

## 2022-12-05 ENCOUNTER — Other Ambulatory Visit: Payer: Self-pay | Admitting: Urology

## 2022-12-05 MED ORDER — NYSTATIN 100000 UNIT/GM EX POWD: 1.0000 | Freq: Three times a day (TID) | CUTANEOUS | 0 refills | Status: AC

## 2022-12-26 DIAGNOSIS — Z936 Other artificial openings of urinary tract status: Secondary | ICD-10-CM | POA: Diagnosis not present

## 2022-12-26 DIAGNOSIS — C7911 Secondary malignant neoplasm of bladder: Secondary | ICD-10-CM | POA: Diagnosis not present

## 2022-12-31 DIAGNOSIS — R413 Other amnesia: Secondary | ICD-10-CM | POA: Diagnosis not present

## 2022-12-31 DIAGNOSIS — Z952 Presence of prosthetic heart valve: Secondary | ICD-10-CM | POA: Diagnosis not present

## 2022-12-31 DIAGNOSIS — Z9889 Other specified postprocedural states: Secondary | ICD-10-CM | POA: Diagnosis not present

## 2022-12-31 DIAGNOSIS — Z7189 Other specified counseling: Secondary | ICD-10-CM | POA: Diagnosis not present

## 2023-01-02 ENCOUNTER — Encounter: Payer: Self-pay | Admitting: Internal Medicine

## 2023-01-02 ENCOUNTER — Telehealth: Payer: Medicare PPO | Admitting: Internal Medicine

## 2023-01-02 ENCOUNTER — Telehealth: Payer: Self-pay

## 2023-01-02 VITALS — Ht 66.0 in | Wt 152.5 lb

## 2023-01-02 DIAGNOSIS — J0101 Acute recurrent maxillary sinusitis: Secondary | ICD-10-CM

## 2023-01-02 MED ORDER — HYDROCOD POLI-CHLORPHE POLI ER 10-8 MG/5ML PO SUER: 5.0000 mL | Freq: Two times a day (BID) | ORAL | 0 refills | Status: DC | PRN

## 2023-01-02 MED ORDER — PREDNISONE 10 MG PO TABS: 20.0000 mg | ORAL_TABLET | Freq: Every day | ORAL | 0 refills | Status: AC

## 2023-01-02 MED ORDER — CEFDINIR 300 MG PO CAPS: 300.0000 mg | ORAL_CAPSULE | Freq: Two times a day (BID) | ORAL | 0 refills | Status: DC

## 2023-01-02 MED ORDER — ATROVENT HFA 17 MCG/ACT IN AERS: 2.0000 | INHALATION_SPRAY | Freq: Four times a day (QID) | RESPIRATORY_TRACT | 12 refills | Status: DC | PRN

## 2023-01-02 NOTE — Assessment & Plan Note (Signed)
Symptoms have been present for over 7 days and involve left sided facial pain. Cough,  and sinus congestion.  Last treatment ofr same was August 19-17; will use cefdinir given recent use of azithromycin

## 2023-01-02 NOTE — Telephone Encounter (Signed)
LMTCB. Need to offer pt a virtual visit with Dr. Darrick Huntsman today at 2pm. Okay per Dr. Darrick Huntsman.

## 2023-01-02 NOTE — Progress Notes (Signed)
Virtual Visit via Caregility   Note   This format is felt to be most appropriate for this patient at this time.  All issues noted in this document were discussed and addressed.  No physical exam was performed (except for noted visual exam findings with Video Visits).   I connected with [name] on 01/02/23 at  2:00 PM EDT by a video enabled telemedicine application and verified that I am speaking with the correct person using two identifiers. Location patient: home Location provider: work or home office Persons participating in the virtual visit: patient, provider  I discussed the limitations, risks, security and privacy concerns of performing an evaluation and management service by telephone and the availability of in person appointments. I also discussed with the patient that there may be a patient responsible charge related to this service. The patient expressed understanding and agreed to proceed.  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  COVID testing has been negative multiple tests since illness began.   Last use of abx was from August 10 to August 17 for same. , azithromycin .     Has A flutter managed with coreg CR 10 mg   H.o MV repair. ,  bladder CA   ROS: See pertinent positives and negatives per HPI.  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   Hyperlipidemia    Hypertension    Neuromuscular disorder [facility])     Past Surgical History:  Procedure Laterality Date   CARDIAC CATHETERIZATION     CATARACT EXTRACTION W/PHACO Left 11/20/2015   Procedure: CATARACT EXTRACTION PHACO AND INTRAOCULAR LENS PLACEMENT (IOC);  Surgeon: Galen Manila, MD;  Location: [facility] ORS;  Service: Ophthalmology;  Laterality: Left;  [place] AP% CDE fluid pack lot # [id number]   CHOLECYSTECTOMY  2011   COLONOSCOPY  2009, 2013   Dr Lorenz Coaster ADENOMA    COLONOSCOPY WITH PROPOFOL N/A 07/24/2016   Procedure: COLONOSCOPY WITH PROPOFOL;  Surgeon: Earline Mayotte, MD;  Location: [facility] ENDOSCOPY;  Service: Endoscopy;  Laterality: N/A;   COLONOSCOPY WITH PROPOFOL N/A 07/04/2021   Procedure: COLONOSCOPY WITH PROPOFOL;  Surgeon: Earline Mayotte, MD;  Location: [facility] ENDOSCOPY;  Service: Endoscopy;  Laterality: N/A;  1ST CASE PER OFFICE   cystectomy  2014   ileal conduit diversion    HERNIA REPAIR  2011   umbilical    MITRAL VALVE REPLACEMENT     TONSILLECTOMY  AS CHILD   TRANSTHORACIC ECHOCARDIOGRAM  11-03-2008  DR FATH Nicholes Rough)   LVF NORMAL/ EF 55-60%/ LEFT ATRIAL ENLARGEMENT/  ADEQUATELY FUNCTIONING MITRAL VALVE REPAIR/  MILD MR & TRI/  NO SIGNIFICANT CHANGE FROM PREVIOUS ECHO   TRANSURETHRAL RESECTION OF BLADDER TUMOR N/A 07/27/2012   Procedure: TRANSURETHRAL  RESECTION OF BLADDER TUMOR (TURBT);  Surgeon: Marcine Matar, MD;  Location: [facility];  Service: Urology;  Laterality: N/A;  WITH MITOMYCIN INSTILLATION    urethroptomy  11/05/2016   [place]   valvuloplasty with replacement of cosgrove ring,1 congenital ruptur of cordi  2004   [facility]   mitral valve     Family History  Adopted: Yes  Problem Relation Age of Onset   Colon cancer Mother    Cancer Neg Hx        Prostate,Kidney,Bladder   Prostate cancer Neg Hx    Bladder  Cancer Neg Hx    Kidney cancer Neg Hx     SOCIAL HX:  reports that he quit smoking about 10 years ago. His smoking use included cigarettes. He has never used smokeless tobacco. He reports current alcohol use of about 1.0 standard drink of alcohol per week. He reports that he does not use drugs.    Current Outpatient Medications:    cefdinir (OMNICEF) 300 MG capsule, Take 1 capsule (300 mg total) by mouth 2 (two) times daily., Disp: 14 capsule, Rfl: 0   chlorpheniramine-HYDROcodone (TUSSIONEX) 10-8 MG/5ML, Take 5 mLs by mouth every 12 (twelve) hours as needed for cough., Disp: 140 mL, Rfl: 0   COREG CR 10 MG 24 hr capsule, TAKE 1 CAPSULE EVERY DAY, Disp: 90 capsule, Rfl: 1   DEXILANT 60 MG capsule, TAKE 1 CAPSULE EVERY DAY, Disp: 90 capsule, Rfl: 3   dicyclomine (BENTYL) 10 MG capsule, TAKE 1 CAPSULE TWICE DAILY, Disp: 180 capsule, Rfl: 3   doxylamine, Sleep, (UNISOM SLEEPTABS) 25 MG tablet, Take by mouth., Disp: , Rfl:    gabapentin (NEURONTIN) 300 MG capsule, TAKE 1 CAPSULE TWICE DAILY, Disp: 180 capsule, Rfl: 1   mirtazapine (REMERON SOL-TAB) 15 MG disintegrating tablet, DISSOLVE 1 TABLET ON THE TONGUE AT BEDTIME, Disp: 90 tablet, Rfl: 3   nystatin (MYCOSTATIN/NYSTOP) powder, Apply 1 Application topically 3 (three) times daily., Disp: 60 g, Rfl: 0   predniSONE (DELTASONE) 10 MG tablet, Take 2 tablets (20 mg total) by mouth daily with breakfast for 5 days. 6 tablets on Day 1 , then reduce by 1 tablet daily until gone, Disp: 10 tablet, Rfl: 0   Probiotic Product (PROBIOTIC & ACIDOPHILUS EX ST PO), Take 2 tablets by mouth at bedtime as needed., Disp: , Rfl:    ipratropium (ATROVENT HFA) 17 MCG/ACT inhaler, Inhale 2 puffs into the lungs every 6 (six) hours as needed for wheezing., Disp: 1 each, Rfl: 12   traMADol (ULTRAM) 50 MG tablet, Take 1 tablet (50 mg total) by mouth every 6 (six) hours as needed. (Patient not taking: Reported on 01/02/2023), Disp: 30 tablet, Rfl: 0   valACYclovir (VALTREX)  1000 MG tablet, Take 1 tablet (1,000 mg total) by mouth 2 (two) times daily. (Patient not taking: Reported on 01/02/2023), Disp: 30 tablet, Rfl: 11  EXAM:  VITALS per patient if applicable:  GENERAL: alert, oriented, appears well and in no acute distress  HEENT: atraumatic, conjunttiva clear, no obvious abnormalities on inspection of external nose and ears  NECK: normal movements of the head and neck  LUNGS: on inspection no signs of respiratory distress, breathing rate appears normal, no obvious gross SOB, gasping or wheezing  CV: no obvious cyanosis  MS: moves all visible extremities without noticeable abnormality  PSYCH/NEURO: pleasant and cooperative, no obvious depression or anxiety, speech and thought processing grossly intact  ASSESSMENT AND PLAN: Acute recurrent maxillary sinusitis Assessment & Plan: Symptoms  have been present for over 7 days and involve left sided facial pain. Cough,  and sinus congestion.  Last treatment ofr same was August 19-17; will use cefdinir given recent use of azithromycin    Other orders -     Atrovent HFA; Inhale 2 puffs into the lungs every 6 (six) hours as needed for wheezing.  Dispense: 1 each; Refill: 12 -     Cefdinir; Take 1 capsule (300 mg total) by mouth 2 (two) times daily.  Dispense: 14 capsule; Refill: 0 -     Hydrocod Poli-Chlorphe Poli ER; Take 5 mLs by mouth every 12 (twelve) hours as needed for cough.  Dispense: 140 mL; Refill: 0 -     predniSONE; Take 2 tablets (20 mg total) by mouth daily with breakfast for 5 days. 6 tablets on Day 1 , then reduce by 1 tablet daily until gone  Dispense: 10 tablet; Refill: 0      I discussed the assessment and treatment plan with the patient. The patient was provided an opportunity to ask questions and all were answered. The patient agreed with the plan and demonstrated an understanding of the instructions.   The patient was advised to call back or seek an in-person evaluation if the symptoms  worsen or if the condition fails to improve as anticipated.   I spent 30 minutes dedicated to the care of this patient on the date of this encounter to include pre-visit review of his medical history,  Face-to-face time with the patient , and post visit ordering of testing and therapeutics.    Sherlene Shams, MD

## 2023-01-10 DIAGNOSIS — J328 Other chronic sinusitis: Secondary | ICD-10-CM | POA: Diagnosis not present

## 2023-01-10 DIAGNOSIS — H6983 Other specified disorders of Eustachian tube, bilateral: Secondary | ICD-10-CM | POA: Diagnosis not present

## 2023-01-12 ENCOUNTER — Telehealth: Payer: Self-pay | Admitting: Urology

## 2023-01-12 ENCOUNTER — Other Ambulatory Visit: Payer: Self-pay | Admitting: Urology

## 2023-01-12 DIAGNOSIS — R102 Pelvic and perineal pain: Secondary | ICD-10-CM

## 2023-01-12 NOTE — Telephone Encounter (Signed)
Patient reached out to me this evening saying that he is had for to 5 days of side pain radiating to his stoma.  He denied any fevers or chills but has been having some slight nausea.  He also denied any blood in the urine.  He states he has been noting some mental status changes.  He has a prescription of Keflex at home and he started taking that antibiotic.  He will come tomorrow to bring a urine specimen for urinalysis and urine culture.  Orders are placed and lab appointment to schedule.

## 2023-01-13 ENCOUNTER — Other Ambulatory Visit: Payer: Medicare PPO

## 2023-01-13 ENCOUNTER — Other Ambulatory Visit: Payer: Self-pay | Admitting: Urology

## 2023-01-13 ENCOUNTER — Telehealth: Payer: Self-pay | Admitting: Urology

## 2023-01-13 ENCOUNTER — Other Ambulatory Visit: Payer: Self-pay | Admitting: *Deleted

## 2023-01-13 DIAGNOSIS — R109 Unspecified abdominal pain: Secondary | ICD-10-CM

## 2023-01-13 LAB — URINALYSIS, COMPLETE
Bilirubin, UA: NEGATIVE
Glucose, UA: NEGATIVE
Ketones, UA: NEGATIVE
Leukocytes,UA: NEGATIVE
Nitrite, UA: NEGATIVE
Protein,UA: NEGATIVE
Specific Gravity, UA: 1.015 (ref 1.005–1.030)
Urobilinogen, Ur: 0.2 mg/dL (ref 0.2–1.0)
pH, UA: 6 (ref 5.0–7.5)

## 2023-01-13 LAB — MICROSCOPIC EXAMINATION

## 2023-01-13 NOTE — Progress Notes (Signed)
Patient's flank pain is still present bilateral and we will go ahead and get the RUS to rule out hydro as a cause of his pain.

## 2023-01-13 NOTE — Telephone Encounter (Signed)
Patient scheduled for RUS.

## 2023-01-16 ENCOUNTER — Other Ambulatory Visit: Payer: Self-pay | Admitting: Urology

## 2023-01-16 ENCOUNTER — Ambulatory Visit
Admission: RE | Admit: 2023-01-16 | Discharge: 2023-01-16 | Disposition: A | Discharge status: Home / Self Care | Payer: Medicare PPO | Source: Ambulatory Visit | Attending: Urology | Admitting: Urology

## 2023-01-16 DIAGNOSIS — R109 Unspecified abdominal pain: Secondary | ICD-10-CM | POA: Diagnosis not present

## 2023-01-16 LAB — CULTURE, URINE COMPREHENSIVE

## 2023-01-16 MED ORDER — CIPROFLOXACIN HCL 500 MG PO TABS: 500.0000 mg | ORAL_TABLET | Freq: Two times a day (BID) | ORAL | 0 refills | Status: DC

## 2023-01-20 ENCOUNTER — Telehealth: Payer: Self-pay | Admitting: Urology

## 2023-01-20 NOTE — Telephone Encounter (Signed)
Patient having intolerable side effects taking the ciprofloxacin.  We discussed coming into the office for an IM injection of Rocephin, he stated he would like to try a few more days of the Cipro and if he feels he cannot complete the prescription or has not seen improvement of his symptoms, we will pursue the IM Rocephin and likely a CT scan.

## 2023-01-21 NOTE — Progress Notes (Signed)
01/22/2023 4:29 PM   Tony Cox [birth year] [id number]  Referring provider: Sherlene Shams, MD [address] [place],  [place] [address]  Urological history: 1. 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 Cherylin Mylar the on 01/28/2013 for muscle invasive bladder cancer status post neoadjuvant it chemotherapy - Patient underwent urethrectomy with Dr. Willey Blade on 11/05/2016.  Pathology was benign. - Currently being followed and managed by Dr Yevonne Pax at [facility] in [place]  2. Prostate cancer - pT2cNO adenocarcinoma of the prostate - Currently being followed and managed by Dr Yevonne Pax at [facility] in [place]   Chief Complaint  Patient presents with   Follow-up   HPI: Tony Cox is a 72 y.o. male who presents today for follow up.    Previous records reviewed.   Patient reach out to me on the evening of September 22 stating that he had 5 days of side pain radiating to his stoma.  He was not having any fevers or chills but had some slight nausea.  He also denied any blood in the urine.  He also noted some mental status changes.  He had some Keflex at home which he started to take and presented the next day for urinalysis.  Urinalysis was grossly infected, but with his history of ileal conduit diversion there is always possibility of colonization.  Urine culture grew out Pseudomonas aeruginosa.  He continued the Keflex for the next day or 2, but he was not seeing improvement so he was switched to Cipro.  He tried the Cipro for a day and a half but found the side effects intolerable.  We also obtained a renal ultrasound to look for any hydronephrosis, but renal ultrasound was negative  He continues to have the right sided waist pain, fatigue and malaise.  Patient denies any modifying or aggravating factors.  Patient denies any recent UTI's,  gross hematuria, dysuria or suprapubic/flank pain.  Patient denies any fevers, chills, nausea or vomiting.    He denies any change in bowel habits.  We also conferenced by phone Dr. Moody Bruins nurse at [facility] in [place] and advised her of our plan of giving Rocephin IM and she is in agreement.  Dr. Willey Blade would like him to come to [place] at some point if the pain does not abate for CT scan, but he will be at a conference until October 24.  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   Hyperlipidemia    Hypertension    Neuromuscular disorder [facility])     Surgical History: Past Surgical History:  Procedure Laterality Date   CARDIAC CATHETERIZATION     CATARACT EXTRACTION W/PHACO Left 11/20/2015   Procedure: CATARACT EXTRACTION PHACO AND INTRAOCULAR LENS PLACEMENT (IOC);  Surgeon: Galen Manila, MD;  Location: [facility] ORS;  Service: Ophthalmology;  Laterality: Left;  [place] AP% CDE fluid pack lot # [id number]   CHOLECYSTECTOMY  2011   COLONOSCOPY  2009, 2013   Dr Lorenz Coaster ADENOMA    COLONOSCOPY WITH PROPOFOL N/A 07/24/2016   Procedure: COLONOSCOPY WITH PROPOFOL;  Surgeon: Earline Mayotte, MD;  Location: [facility] ENDOSCOPY;  Service: Endoscopy;  Laterality: N/A;   COLONOSCOPY WITH PROPOFOL N/A 07/04/2021   Procedure: COLONOSCOPY WITH PROPOFOL;  Surgeon: Earline Mayotte, MD;  Location: [facility] ENDOSCOPY;  Service: Endoscopy;  Laterality: N/A;  1ST CASE PER OFFICE   cystectomy  2014   ileal  conduit diversion    HERNIA REPAIR  2011   umbilical    MITRAL VALVE REPLACEMENT     TONSILLECTOMY  AS CHILD   TRANSTHORACIC ECHOCARDIOGRAM  11-03-2008  DR FATH Nicholes Rough)   LVF NORMAL/ EF 55-60%/ LEFT ATRIAL ENLARGEMENT/  ADEQUATELY FUNCTIONING MITRAL VALVE REPAIR/  MILD MR & TRI/  NO SIGNIFICANT CHANGE FROM PREVIOUS ECHO   TRANSURETHRAL RESECTION OF BLADDER TUMOR N/A 07/27/2012   Procedure: TRANSURETHRAL RESECTION OF BLADDER TUMOR (TURBT);  Surgeon: Marcine Matar, MD;  Location: [facility];  Service: Urology;  Laterality: N/A;  WITH MITOMYCIN INSTILLATION    urethroptomy  11/05/2016   [place]   valvuloplasty with replacement of cosgrove ring,1 congenital ruptur of cordi  2004   [facility]   mitral valve     Home Medications:  Allergies as of 01/22/2023       Reactions   Codeine Nausea And Vomiting, Hives        Medication List        Accurate as of January 22, 2023 11:59 PM. If you have any questions, ask your nurse or doctor.          Atrovent HFA 17 MCG/ACT inhaler Generic drug: ipratropium Inhale 2 puffs into the lungs every 6 (six) hours as needed for wheezing.   cefdinir 300 MG capsule Commonly known as: OMNICEF Take 1 capsule (300 mg total) by mouth 2 (two) times daily.   chlorpheniramine-HYDROcodone 10-8 MG/5ML Commonly known as: TUSSIONEX Take 5 mLs by mouth every 12 (twelve) hours as needed for cough.   ciprofloxacin 500 MG tablet Commonly known as: CIPRO Take 1 tablet (500 mg total) by mouth every 12 (twelve) hours.   Coreg CR 10 MG 24 hr capsule Generic drug: carvedilol TAKE 1 CAPSULE EVERY DAY   Dexilant 60 MG capsule Generic drug: dexlansoprazole TAKE 1 CAPSULE EVERY DAY   dicyclomine 10 MG capsule Commonly known as: BENTYL TAKE 1 CAPSULE TWICE DAILY   gabapentin 300 MG capsule Commonly known as: NEURONTIN TAKE 1 CAPSULE TWICE DAILY   mirtazapine 15 MG disintegrating tablet Commonly known as: REMERON  SOL-TAB DISSOLVE 1 TABLET ON THE TONGUE AT BEDTIME   nystatin powder Commonly known as: MYCOSTATIN/NYSTOP Apply 1 Application topically 3 (three) times daily.   PROBIOTIC & ACIDOPHILUS EX ST PO Take 2 tablets by mouth at bedtime as needed.   traMADol 50 MG tablet Commonly known as: ULTRAM Take 1 tablet (50 mg total) by mouth every 6 (six) hours as needed.   Unisom SleepTabs 25 MG tablet Generic drug: doxylamine (Sleep) Take by mouth.   valACYclovir 1000 MG tablet Commonly known as: VALTREX Take 1 tablet (1,000 mg total) by mouth 2 (two) times daily.        Allergies:  Allergies  Allergen Reactions   Codeine Nausea And Vomiting and Hives    Family History: Family History  Adopted: Yes  Problem Relation Age of Onset  Colon cancer Mother    Cancer Neg Hx        Prostate,Kidney,Bladder   Prostate cancer Neg Hx    Bladder Cancer Neg Hx    Kidney cancer Neg Hx     Social History:  reports that he quit smoking about 10 years ago. His smoking use included cigarettes. He has never used smokeless tobacco. He reports current alcohol use of about 1.0 standard drink of alcohol per week. He reports that he does not use drugs.  ROS: Pertinent ROS in HPI  Physical Exam: BP 138/71   Pulse 62   Constitutional:  Well nourished. Alert and oriented, No acute distress. HEENT: [place] AT, moist mucus membranes.  Trachea midline Cardiovascular: No clubbing, cyanosis, or edema. Respiratory: Normal respiratory effort, no increased work of breathing. GI: Abdomen is soft, non tender, Large Stomal hernia noted.  Stoma is healthy pink.  Urine is yellow clear.  Neurologic: Grossly intact, no focal deficits, moving all 4 extremities. Psychiatric: Normal mood and affect.  Laboratory Data: Urinalysis Results for orders placed or performed in visit on 01/13/23  CULTURE, URINE COMPREHENSIVE   Specimen: Urine   UR  Result Value Ref Range   Urine Culture, Comprehensive Final report (A)     Organism ID, Bacteria Comment (A)    ANTIMICROBIAL SUSCEPTIBILITY Comment   Microscopic Examination   Urine  Result Value Ref Range   WBC, UA 11-30 (A) 0 - 5 /hpf   RBC, Urine 3-10 (A) 0 - 2 /hpf   Epithelial Cells (non renal) 0-10 0 - 10 /hpf   Crystals Present (A) N/A   Crystal Type Amorphous Sediment N/A   Mucus, UA Present (A) Not Estab.   Bacteria, UA Moderate (A) None seen/Few  Urinalysis, Complete  Result Value Ref Range   Specific Gravity, UA 1.015 1.005 - 1.030   pH, UA 6.0 5.0 - 7.5   Color, UA Yellow Yellow   Appearance Ur Clear Clear   Leukocytes,UA Negative Negative   Protein,UA Negative Negative/Trace   Glucose, UA Negative Negative   Ketones, UA Negative Negative   RBC, UA Trace (A) Negative   Bilirubin, UA Negative Negative   Urobilinogen, Ur 0.2 0.2 - 1.0 mg/dL   Nitrite, UA Negative Negative   Microscopic Examination See below:   I have reviewed the labs.   Pertinent Imaging: CLINICAL DATA:  Flank pain   EXAM: RENAL / URINARY TRACT ULTRASOUND COMPLETE   COMPARISON:  11/13/2012   FINDINGS: Right Kidney:   Renal measurements: 9.6 x 4.3 x 5.0 cm = volume: 105 mL. Echogenicity within normal limits. No mass or hydronephrosis visualized.   Left Kidney:   Renal measurements: 10.3 x 5.3 x 5.0 cm = volume: 143 mL. Echogenicity within normal limits. No mass or hydronephrosis visualized.   Bladder:   Surgically removed   Other:   None.   IMPRESSION: 1. Normal renal sonogram. 2. Status post cystectomy     Electronically Signed   By: Helyn Numbers M.D.   On: 01/16/2023 19:27 I have independently reviewed the films.   See HPI.   Assessment & Plan:    1. UTI -Symptoms not abating with Keflex and Cipro is with intolerable side effects -We will give Rocephin IM 1 g here in the office and then he will continue Keflex tomorrow -He will report back if he does not feel improvement in the next couple of days  2. Flank pain -If patient's pain  does not improve with antibiotics, we will go  ahead and pursue CT urogram at Dr. Moody Bruins request -Return to clinic precautions reviewed  Return if symptoms worsen or fail to improve.  These notes generated with voice recognition software. I apologize for typographical errors.  Cloretta Ned  [facility] [address]  [address] [place], [place] [address] [phone]

## 2023-01-22 ENCOUNTER — Ambulatory Visit: Payer: Medicare PPO | Admitting: Urology

## 2023-01-22 ENCOUNTER — Encounter: Payer: Self-pay | Admitting: Urology

## 2023-01-22 VITALS — BP 138/71 | HR 62

## 2023-01-22 DIAGNOSIS — R109 Unspecified abdominal pain: Secondary | ICD-10-CM

## 2023-01-22 DIAGNOSIS — R399 Unspecified symptoms and signs involving the genitourinary system: Secondary | ICD-10-CM

## 2023-01-22 NOTE — Progress Notes (Signed)
IM Injection  Patient is present today for an IM Injection for treatment of rocephin Drug: rocephin Dose:1 gram (2.1 ml of lidocaine) Location:RUOQ Lot: [id number] Exp:06/20/2024 Patient tolerated well, no complications were noted  Performed by: Randa Lynn, RMA

## 2023-01-26 ENCOUNTER — Other Ambulatory Visit: Payer: Self-pay | Admitting: Urology

## 2023-01-26 DIAGNOSIS — R109 Unspecified abdominal pain: Secondary | ICD-10-CM

## 2023-01-27 ENCOUNTER — Telehealth: Payer: Self-pay | Admitting: Urology

## 2023-01-27 ENCOUNTER — Other Ambulatory Visit: Payer: Self-pay | Admitting: Urology

## 2023-01-27 DIAGNOSIS — R109 Unspecified abdominal pain: Secondary | ICD-10-CM

## 2023-01-27 DIAGNOSIS — C689 Malignant neoplasm of urinary organ, unspecified: Secondary | ICD-10-CM

## 2023-01-27 MED ORDER — CEFTRIAXONE SODIUM 1 G IJ SOLR
1.0000 g | Freq: Once | INTRAMUSCULAR | Status: AC
Start: 2023-01-27 — End: 2023-01-31
  Administered 2023-01-31: 1 g via INTRAMUSCULAR

## 2023-01-27 NOTE — Telephone Encounter (Signed)
Patient reached out to be less eating stated that for 2 to 3 days after the Rocephin he felt well, but he is now back to having the malaise and flank pain.  I went ahead and placed order for CT urogram which he will get tomorrow.  I also have ordered a CBC with differential and a complete metabolic panel for further evaluation.

## 2023-01-28 ENCOUNTER — Ambulatory Visit
Admission: RE | Admit: 2023-01-28 | Discharge: 2023-01-28 | Disposition: A | Discharge status: Home / Self Care | Payer: Medicare PPO | Source: Ambulatory Visit | Attending: Urology | Admitting: Urology

## 2023-01-28 ENCOUNTER — Other Ambulatory Visit: Payer: Self-pay | Admitting: Urology

## 2023-01-28 DIAGNOSIS — C679 Malignant neoplasm of bladder, unspecified: Secondary | ICD-10-CM | POA: Diagnosis not present

## 2023-01-28 DIAGNOSIS — C689 Malignant neoplasm of urinary organ, unspecified: Secondary | ICD-10-CM | POA: Diagnosis not present

## 2023-01-28 DIAGNOSIS — R109 Unspecified abdominal pain: Secondary | ICD-10-CM

## 2023-01-28 DIAGNOSIS — Z9049 Acquired absence of other specified parts of digestive tract: Secondary | ICD-10-CM | POA: Diagnosis not present

## 2023-01-28 DIAGNOSIS — K5730 Diverticulosis of large intestine without perforation or abscess without bleeding: Secondary | ICD-10-CM | POA: Diagnosis not present

## 2023-01-28 MED ORDER — IOHEXOL 300 MG/ML  SOLN
100.0000 mL | Freq: Once | INTRAMUSCULAR | Status: AC | PRN
  Administered 2023-01-28: 100 mL via INTRAVENOUS

## 2023-01-31 DIAGNOSIS — R399 Unspecified symptoms and signs involving the genitourinary system: Secondary | ICD-10-CM | POA: Diagnosis not present

## 2023-01-31 DIAGNOSIS — R109 Unspecified abdominal pain: Secondary | ICD-10-CM | POA: Diagnosis not present

## 2023-02-04 ENCOUNTER — Other Ambulatory Visit: Payer: Self-pay

## 2023-02-04 ENCOUNTER — Ambulatory Visit (INDEPENDENT_AMBULATORY_CARE_PROVIDER_SITE_OTHER): Payer: Self-pay | Admitting: Dermatology

## 2023-02-04 DIAGNOSIS — B009 Herpesviral infection, unspecified: Secondary | ICD-10-CM

## 2023-02-04 DIAGNOSIS — L988 Other specified disorders of the skin and subcutaneous tissue: Secondary | ICD-10-CM

## 2023-02-04 MED ORDER — VALACYCLOVIR HCL 1 G PO TABS: 1000.0000 mg | ORAL_TABLET | Freq: Two times a day (BID) | ORAL | 11 refills | Status: DC

## 2023-02-04 MED ORDER — METRONIDAZOLE 1 % EX GEL: CUTANEOUS | 11 refills | Status: DC

## 2023-02-04 MED ORDER — DOXYCYCLINE MONOHYDRATE 100 MG PO TABS: ORAL_TABLET | ORAL | 5 refills | Status: DC

## 2023-02-04 NOTE — Progress Notes (Signed)
   Follow-Up Visit   Subjective  Tony Cox is a 72 y.o. male who presents for the following: Botox for facial elastosis  The following portions of the chart were reviewed this encounter and updated as appropriate: medications, allergies, medical history  Review of Systems:  No other skin or systemic complaints except as noted in HPI or Assessment and Plan.  Objective  Well appearing patient in no apparent distress; mood and affect are within normal limits.  A focused examination was performed of the face.  Relevant physical exam findings are noted in the Assessment and Plan.              Assessment & Plan    Facial Elastosis  Location: See attached image  Informed consent: Discussed risks (infection, pain, bleeding, bruising, swelling, allergic reaction, paralysis of nearby muscles, eyelid droop, double vision, neck weakness, difficulty breathing, headache, undesirable cosmetic result, and need for additional treatment) and benefits of the procedure, as well as the alternatives.  Informed consent was obtained.  Preparation: The area was cleansed with alcohol.  Procedure Details:  Botox was injected into the dermis with a 30-gauge needle. Pressure applied to any bleeding. Ice packs offered for swelling.  Lot Number:  [id number] Expiration:  12/2024  Total Units Injected:  45  Plan: Tylenol may be used for headache.  Allow 2 weeks before returning to clinic for additional dosing as needed. Patient will call for any problems.  Return in about 1 month (around 03/07/2023) for f/u Botox.  Wendee Beavers, CMA, am acting as scribe for Armida Sans, MD .   Documentation: I have reviewed the above documentation for accuracy and completeness, and I agree with the above.  Armida Sans, MD

## 2023-02-04 NOTE — Progress Notes (Signed)
Refills sent to [facility] per patient request for doxycycline, Valtrex, and metronidazole 1% gel.

## 2023-02-04 NOTE — Patient Instructions (Signed)

## 2023-02-06 DIAGNOSIS — R269 Unspecified abnormalities of gait and mobility: Secondary | ICD-10-CM | POA: Diagnosis not present

## 2023-02-06 DIAGNOSIS — R4181 Age-related cognitive decline: Secondary | ICD-10-CM | POA: Diagnosis not present

## 2023-02-06 DIAGNOSIS — R4189 Other symptoms and signs involving cognitive functions and awareness: Secondary | ICD-10-CM | POA: Diagnosis not present

## 2023-02-10 ENCOUNTER — Telehealth: Payer: Self-pay

## 2023-02-10 NOTE — Telephone Encounter (Signed)
Patient called requesting an appt this week, scheduled patient for Oct 22

## 2023-02-11 ENCOUNTER — Encounter: Payer: Self-pay | Admitting: Dermatology

## 2023-02-11 ENCOUNTER — Other Ambulatory Visit: Payer: Self-pay | Admitting: Urology

## 2023-02-11 ENCOUNTER — Ambulatory Visit: Payer: Medicare PPO | Admitting: Dermatology

## 2023-02-11 DIAGNOSIS — L309 Dermatitis, unspecified: Secondary | ICD-10-CM

## 2023-02-11 DIAGNOSIS — Z79899 Other long term (current) drug therapy: Secondary | ICD-10-CM | POA: Diagnosis not present

## 2023-02-11 DIAGNOSIS — L719 Rosacea, unspecified: Secondary | ICD-10-CM

## 2023-02-11 DIAGNOSIS — Z7189 Other specified counseling: Secondary | ICD-10-CM

## 2023-02-11 DIAGNOSIS — L237 Allergic contact dermatitis due to plants, except food: Secondary | ICD-10-CM | POA: Diagnosis not present

## 2023-02-11 DIAGNOSIS — B3742 Candidal balanitis: Secondary | ICD-10-CM | POA: Diagnosis not present

## 2023-02-11 DIAGNOSIS — N481 Balanitis: Secondary | ICD-10-CM

## 2023-02-11 DIAGNOSIS — B379 Candidiasis, unspecified: Secondary | ICD-10-CM

## 2023-02-11 MED ORDER — DOXYCYCLINE MONOHYDRATE 100 MG PO CAPS: 100.0000 mg | ORAL_CAPSULE | Freq: Every day | ORAL | 5 refills | Status: DC

## 2023-02-11 MED ORDER — MOMETASONE FUROATE 0.1 % EX CREA: 1.0000 | TOPICAL_CREAM | Freq: Every day | CUTANEOUS | 6 refills | Status: DC | PRN

## 2023-02-11 MED ORDER — FLUCONAZOLE 200 MG PO TABS: 200.0000 mg | ORAL_TABLET | ORAL | 1 refills | Status: DC

## 2023-02-11 MED ORDER — CLOBETASOL PROPIONATE 0.05 % EX CREA: 1.0000 | TOPICAL_CREAM | Freq: Two times a day (BID) | CUTANEOUS | 0 refills | Status: AC

## 2023-02-11 MED ORDER — FLUCONAZOLE 100 MG PO TABS
100.0000 mg | ORAL_TABLET | Freq: Every day | ORAL | 0 refills | Status: DC
Start: 2023-02-11 — End: 2023-02-11

## 2023-02-11 MED ORDER — METRONIDAZOLE 1 % EX GEL: Freq: Every day | CUTANEOUS | 6 refills | Status: AC

## 2023-02-11 NOTE — Patient Instructions (Addendum)
For Groin Continue Diflucan 200mg  1 pill 3 times a week Monday, Wednesday, Friday for only 1 week, you will have some left over for when you flare again Continue Doxycycline 100mg  1 pill a day for 4 more days, take with food and drink Start Opzelura cr once daily until clear (samples given)  For [facility] Clobetasol cream twice a day to poison ivy on arm until clear, avoid face, groin, axilla    Due to recent changes in healthcare laws, you may see results of your pathology and/or laboratory studies on MyChart before the doctors have had a chance to review them. We understand that in some cases there may be results that are confusing or concerning to you. Please understand that not all results are received at the same time and often the doctors may need to interpret multiple results in order to provide you with the best plan of care or course of treatment. Therefore, we ask that you please give [place] 2 business days to thoroughly review all your results before contacting the office for clarification. Should we see a critical lab result, you will be contacted sooner.   If You Need Anything After Your Visit  If you have any questions or concerns for your doctor, please call our main line at [phone] and press option 4 to reach your doctor's medical assistant. If no one answers, please leave a voicemail as directed and we will return your call as soon as possible. Messages left after 4 pm will be answered the following business day.   You may also send [place] a message via MyChart. We typically respond to MyChart messages within 1-2 business days.  For prescription refills, please ask your pharmacy to contact our office. Our fax number is [phone].  If you have an urgent issue when the clinic is closed that cannot wait until the next business day, you can page your doctor at the number below.    Please note that while we do our best to be available for urgent issues outside of office  hours, we are not available 24/7.   If you have an urgent issue and are unable to reach [place], you may choose to seek medical care at your doctor's office, retail clinic, urgent care center, or emergency room.  If you have a medical emergency, please immediately call 911 or go to the emergency department.  Pager Numbers  - Dr. Gwen Pounds: [phone]  - Dr. Roseanne Reno: [phone]  - Dr. Katrinka Blazing: [phone]   In the event of inclement weather, please call our main line at [phone] for an update on the status of any delays or closures.  Dermatology Medication Tips: Please keep the boxes that topical medications come in in order to help keep track of the instructions about where and how to use these. Pharmacies typically print the medication instructions only on the boxes and not directly on the medication tubes.   If your medication is too expensive, please contact our office at [phone] option 4 or send [place] a message through MyChart.   We are unable to tell what your co-pay for medications will be in advance as this is different depending on your insurance coverage. However, we may be able to find a substitute medication at lower cost or fill out paperwork to get insurance to cover a needed medication.   If a prior authorization is required to get your medication covered by your insurance company, please allow [place] 1-2 business days to complete this process.  Drug prices often vary depending on where the prescription is filled and some pharmacies may offer cheaper prices.  The website www.goodrx.com contains coupons for medications through different pharmacies. The prices here do not account for what the cost may be with help from insurance (it may be cheaper with your insurance), but the website can give you the price if you did not use any insurance.  - You can print the associated coupon and take it with your prescription to the pharmacy.  - You may also stop by our office during  regular business hours and pick up a GoodRx coupon card.  - If you need your prescription sent electronically to a different pharmacy, notify our office through [facility] or by phone at [phone] option 4.     Si Usted Necesita Algo Despus de Su Visita  Tambin puede enviarnos un mensaje a travs de Clinical cytogeneticist. Por lo general respondemos a los mensajes de MyChart en el transcurso de 1 a 2 das hbiles.  Para renovar recetas, por favor pida a su farmacia que se ponga en contacto con nuestra oficina. Annie Sable de fax es [place] [phone].  Si tiene un asunto urgente cuando la clnica est cerrada y que no puede esperar hasta el siguiente da hbil, puede llamar/localizar a su doctor(a) al nmero que aparece a continuacin.   Por favor, tenga en cuenta que aunque hacemos todo lo posible para estar disponibles para asuntos urgentes fuera del horario de [place], no estamos disponibles las 24 horas del da, los 7 [address]  [address] de la [place].   Si tiene un problema urgente y no puede comunicarse con nosotros, puede optar por buscar atencin mdica  en el consultorio de su doctor(a), en una clnica privada, en un centro de atencin urgente o en una sala de emergencias.  Si tiene Engineer, drilling, por favor llame inmediatamente al 911 o vaya a la sala de emergencias.  Nmeros de bper  - Dr. Gwen Pounds: [phone]  - Dra. Roseanne Reno: [phone]  - Dr. Katrinka Blazing: [phone]   En caso de inclemencias del tiempo, por favor llame a Lacy Duverney principal al [phone] para una actualizacin sobre el [place] de cualquier retraso o cierre.  Consejos para la medicacin en dermatologa: Por favor, guarde las cajas en las que vienen los medicamentos de uso tpico para ayudarle a seguir las instrucciones sobre dnde y cmo usarlos. Las farmacias generalmente imprimen las instrucciones del medicamento slo en las cajas y no directamente en los tubos del [place].   Si su medicamento es muy  caro, por favor, pngase en contacto con Rolm Gala llamando al [phone] y presione la opcin 4 o envenos un mensaje a travs de Clinical cytogeneticist.   No podemos decirle cul ser su copago por los medicamentos por adelantado ya que esto es diferente dependiendo de la cobertura de su seguro. Sin embargo, es posible que podamos encontrar un medicamento sustituto a Audiological scientist un formulario para que el seguro cubra el medicamento que se considera necesario.   Si se requiere una autorizacin previa para que su compaa de seguros [place] su medicamento, por favor permtanos de 1 a 2 das hbiles para completar [address]  Los precios de los medicamentos varan con frecuencia dependiendo del Environmental consultant de dnde se surte la receta y alguna farmacias pueden ofrecer precios ms baratos.  El sitio web www.goodrx.com tiene cupones para medicamentos de Health and safety inspector. Los precios aqu no tienen en cuenta lo que podra costar con la ayuda del seguro (puede ser  ms barato con su seguro), pero el sitio web puede darle el precio si no Visual merchandiser.  - Puede imprimir el cupn correspondiente y llevarlo con su receta a la farmacia.  - Tambin puede pasar por nuestra oficina durante el horario de atencin regular y Education officer, museum una tarjeta de cupones de GoodRx.  - Si necesita que su receta se enve electrnicamente a una farmacia diferente, informe a nuestra oficina a travs de MyChart de [place] o por telfono llamando al [phone] y presione la opcin 4.

## 2023-02-11 NOTE — Progress Notes (Signed)
Patient contacted the office today stating that he developed a yeast infection after taking his antibiotics and needs a prescription of Diflucan.  [facility]

## 2023-02-11 NOTE — Progress Notes (Signed)
Follow-Up Visit   Subjective  Tony Cox is a 72 y.o. male who presents for the following: itchy rash R arm, just noticed yesterday, itchy and stinging, yeast infection? Groin, pt had some old Diflucan at home and took ~3days worth, Doxycycline 100mg  1 po qd x 3 days The patient has spots, moles and lesions to be evaluated, some may be new or changing and the patient may have concern these could be cancer.   The following portions of the chart were reviewed this encounter and updated as appropriate: medications, allergies, medical history  Review of Systems:  No other skin or systemic complaints except as noted in HPI or Assessment and Plan.  Objective  Well appearing patient in no apparent distress; mood and affect are within normal limits.   A focused examination was performed of the following areas: R arm, groin, face  Relevant exam findings are noted in the Assessment and Plan.    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 Condition is bothersome/symptomatic for patient. Currently flared.  Treatment Plan: Start Clobetasol cr bid  to arm until clear, avoid face, groin, axilla  Topical steroids (such as triamcinolone, fluocinolone, fluocinonide, mometasone, clobetasol, halobetasol, betamethasone, hydrocortisone) can cause thinning and lightening of the skin if they are used for too long in the same area. Your physician has selected the right strength medicine for your problem and area affected on the body. Please use your medication only as directed by your physician to prevent side effects.    CANDIDA / BALANITIS penis Exam Small area of pinkness on the dorsum glans penis  Chronic and persistent condition . Condition is bothersome/symptomatic for patient. Currently flared.  Treatment  Plan Start Diflucan 200mg  1 po 3x/wk Monday, Wednesday, Friday x 1 week Cont Doxycycline 100mg  1 po qd with food and drink for 4 more days Start Opzelura cr qd until clear, sample x 2 Lot [id number] exp 05/22/24  Side effects of fluconazole (diflucan) include nausea, diarrhea, headache, dizziness, taste changes, rare risk of irritation of the liver, allergy, or decreased blood counts (which could show up as infection or tiredness).  Doxycycline should be taken with food to prevent nausea. Do not lay down for 30 minutes after taking. Be cautious with sun exposure and use good sun protection while on this medication. Pregnant women should not take this medication.    ROSACEA face Exam Mid face erythema with telangiectasias +/- scattered inflammatory papules  Chronic and persistent condition with duration or expected duration over one year. Condition is symptomatic / bothersome to patient. Not to goal.  Rosacea is a chronic progressive skin condition usually affecting the face of adults, causing redness and/or acne bumps. It is treatable but not curable. It sometimes affects the eyes (ocular rosacea) as well. It may respond to topical and/or systemic medication and can flare with stress, sun exposure, alcohol, exercise, topical steroids (including hydrocortisone/cortisone 10) and some foods.  Daily application of broad spectrum spf 30+ sunscreen to face is recommended to reduce flares.  Patient denies grittiness of the eyes  Treatment Plan Cont Metrogel 1% qhs to face  DERMATITIS Trunk, extremities Exam: Clear today Treatment Plan: Cont Mometasone cr qd 5d/wk aa dermatitis on body until clear, avoid face, groin, axilla   Return if symptoms worsen or fail to improve.  I, Sonya Hupman, RMA, am  acting as scribe for Armida Sans, MD .   Documentation: I have reviewed the above documentation for accuracy and completeness, and I agree with the above.  Armida Sans, MD

## 2023-02-12 ENCOUNTER — Encounter: Payer: Self-pay | Admitting: Dermatology

## 2023-02-12 ENCOUNTER — Ambulatory Visit (INDEPENDENT_AMBULATORY_CARE_PROVIDER_SITE_OTHER): Payer: Medicare PPO | Admitting: Dermatology

## 2023-02-12 DIAGNOSIS — Z7189 Other specified counseling: Secondary | ICD-10-CM

## 2023-02-12 DIAGNOSIS — Z79899 Other long term (current) drug therapy: Secondary | ICD-10-CM

## 2023-02-12 DIAGNOSIS — B3742 Candidal balanitis: Secondary | ICD-10-CM | POA: Diagnosis not present

## 2023-02-12 DIAGNOSIS — L244 Irritant contact dermatitis due to drugs in contact with skin: Secondary | ICD-10-CM | POA: Diagnosis not present

## 2023-02-12 DIAGNOSIS — R21 Rash and other nonspecific skin eruption: Secondary | ICD-10-CM

## 2023-02-12 DIAGNOSIS — N481 Balanitis: Secondary | ICD-10-CM

## 2023-02-12 MED ORDER — PREDNISONE 10 MG PO TABS: 10.0000 mg | ORAL_TABLET | Freq: Every day | ORAL | 0 refills | Status: AC

## 2023-02-12 NOTE — Progress Notes (Signed)
   Follow Up Visit   Subjective  Tony Cox is a 72 y.o. male who presents for the following: Rash at dorsum glans penis. Patient states he used to Opzelura cream this morning and had an allergic reaction with increased redness and burning. He states he is in a lot of pain; stinging and burning. Washed Opzelura cream off this afternoon and applied Mometasone. He states it is a little better but still very painful.  Took 2 diflucan last night. Applied Mometasone last night. Also concerned with new Doxycycline Rx. Usually takes Doxycycline hyclate and monohydrate was prescribed yesterday.    The following portions of the chart were reviewed this encounter and updated as appropriate: medications, allergies, medical history  Review of Systems:  No other skin or systemic complaints except as noted in HPI or Assessment and Plan.  Objective  Well appearing patient in no apparent distress; mood and affect are within normal limits.  A focused examination was performed of the following areas: Groin, penis  Relevant exam findings are noted in the Assessment and Plan.   Assessment & Plan    RASH; irritant dermatitis - with symptoms of stinging and burning.   2ndary to Opzelura cream applied just prior to symptoms Pt is very sensitive especially after surgery for bladder cancer Exam: erythema in small area dorsum  glans penis - appears same as yesterday's exam Treatment Plan: Start Prednisone 10 mg once daily for 7 days  (he has had improvement with similar symptoms of penis with oral steroids in past - has taken for sinus issues)  CANDIDA / BALANITIS penis Exam Small area of pinkness on the dorsum glands penis Chronic and persistent condition with duration or expected duration over one year. Condition is bothersome/symptomatic for patient. Currently flared. Treatment Plan Continue Diflucan 200mg  1 pill 3 times a week Monday, Wednesday, Friday for only 1 week,  you will have some left  over for when you flare again  Continue Doxycycline 100mg  1 pill a day for 3 more days, take with food and drink    Side effects of fluconazole (diflucan) include nausea, diarrhea, headache, dizziness, taste changes, rare risk of irritation of the liver, allergy, or decreased blood counts (which could show up as infection or tiredness).   Doxycycline should be taken with food to prevent nausea. Do not lay down for 30 minutes after taking. Be cautious with sun exposure and use good sun protection while on this medication. Pregnant women should not take this medication.    Return if symptoms worsen or fail to improve.  I, Lawson Radar, CMA, am acting as scribe for Armida Sans, MD.  Documentation: I have reviewed the above documentation for accuracy and completeness, and I agree with the above.  Armida Sans, MD

## 2023-02-12 NOTE — Patient Instructions (Addendum)
Start Prednisone 10 mg once daily for 7 days   Continue Diflucan 200mg  1 pill 3 times a week Monday, Wednesday, Friday for only 1 week, you will have some left over for when you flare again  Continue Doxycycline 100mg  1 pill a day for 3 more days, take with food and drink   Risks of prednisone  include mood irritability, insomnia, weight gain, stomach ulcers, increased risk of infection, increased blood sugar (diabetes), hypertension, osteoporosis with long-term or frequent use, and rare risk of avascular necrosis of the hip.   Doxycycline should be taken with food to prevent nausea. Do not lay down for 30 minutes after taking. Be cautious with sun exposure and use good sun protection while on this medication. Pregnant women should not take this medication.   Side effects of fluconazole (diflucan) include nausea, diarrhea, headache, dizziness, taste changes, rare risk of irritation of the liver, allergy, or decreased blood counts (which could show up as infection or tiredness).  Due to recent changes in healthcare laws, you may see results of your pathology and/or laboratory studies on MyChart before the doctors have had a chance to review them. We understand that in some cases there may be results that are confusing or concerning to you. Please understand that not all results are received at the same time and often the doctors may need to interpret multiple results in order to provide you with the best plan of care or course of treatment. Therefore, we ask that you please give [place] 2 business days to thoroughly review all your results before contacting the office for clarification. Should we see a critical lab result, you will be contacted sooner.   If You Need Anything After Your Visit  If you have any questions or concerns for your doctor, please call our main line at [phone] and press option 4 to reach your doctor's medical assistant. If no one answers, please leave a voicemail as directed  and we will return your call as soon as possible. Messages left after 4 pm will be answered the following business day.   You may also send [place] a message via MyChart. We typically respond to MyChart messages within 1-2 business days.  For prescription refills, please ask your pharmacy to contact our office. Our fax number is [phone].  If you have an urgent issue when the clinic is closed that cannot wait until the next business day, you can page your doctor at the number below.    Please note that while we do our best to be available for urgent issues outside of office hours, we are not available 24/7.   If you have an urgent issue and are unable to reach [place], you may choose to seek medical care at your doctor's office, retail clinic, urgent care center, or emergency room.  If you have a medical emergency, please immediately call 911 or go to the emergency department.  Pager Numbers  - Dr. Gwen Pounds: [phone]  - Dr. Roseanne Reno: [phone]  - Dr. Katrinka Blazing: [phone]   In the event of inclement weather, please call our main line at [phone] for an update on the status of any delays or closures.  Dermatology Medication Tips: Please keep the boxes that topical medications come in in order to help keep track of the instructions about where and how to use these. Pharmacies typically print the medication instructions only on the boxes and not directly on the medication tubes.   If your medication is too expensive, please contact  our office at [phone] option 4 or send [place] a message through MyChart.   We are unable to tell what your co-pay for medications will be in advance as this is different depending on your insurance coverage. However, we may be able to find a substitute medication at lower cost or fill out paperwork to get insurance to cover a needed medication.   If a prior authorization is required to get your medication covered by your insurance company, please allow [place]  1-2 business days to complete this process.  Drug prices often vary depending on where the prescription is filled and some pharmacies may offer cheaper prices.  The website www.goodrx.com contains coupons for medications through different pharmacies. The prices here do not account for what the cost may be with help from insurance (it may be cheaper with your insurance), but the website can give you the price if you did not use any insurance.  - You can print the associated coupon and take it with your prescription to the pharmacy.  - You may also stop by our office during regular business hours and pick up a GoodRx coupon card.  - If you need your prescription sent electronically to a different pharmacy, notify our office through [facility] or by phone at [phone] option 4.     Si Usted Necesita Algo Despus de Su Visita  Tambin puede enviarnos un mensaje a travs de Clinical cytogeneticist. Por lo general respondemos a los mensajes de MyChart en el transcurso de 1 a 2 das hbiles.  Para renovar recetas, por favor pida a su farmacia que se ponga en contacto con nuestra oficina. Annie Sable de fax es [place] [phone].  Si tiene un asunto urgente cuando la clnica est cerrada y que no puede esperar hasta el siguiente da hbil, puede llamar/localizar a su doctor(a) al nmero que aparece a continuacin.   Por favor, tenga en cuenta que aunque hacemos todo lo posible para estar disponibles para asuntos urgentes fuera del horario de [place], no estamos disponibles las 24 horas del da, los 7 [address]  [address] de la [place].   Si tiene un problema urgente y no puede comunicarse con nosotros, puede optar por buscar atencin mdica  en el consultorio de su doctor(a), en una clnica privada, en un centro de atencin urgente o en una sala de emergencias.  Si tiene Engineer, drilling, por favor llame inmediatamente al 911 o vaya a la sala de emergencias.  Nmeros de bper  - Dr. Gwen Pounds: [phone]  -  Dra. Roseanne Reno: [phone]  - Dr. Katrinka Blazing: [phone]   En caso de inclemencias del tiempo, por favor llame a Lacy Duverney principal al [phone] para una actualizacin sobre el [place] de cualquier retraso o cierre.  Consejos para la medicacin en dermatologa: Por favor, guarde las cajas en las que vienen los medicamentos de uso tpico para ayudarle a seguir las instrucciones sobre dnde y cmo usarlos. Las farmacias generalmente imprimen las instrucciones del medicamento slo en las cajas y no directamente en los tubos del [place].   Si su medicamento es muy caro, por favor, pngase en contacto con Rolm Gala llamando al [phone] y presione la opcin 4 o envenos un mensaje a travs de Clinical cytogeneticist.   No podemos decirle cul ser su copago por los medicamentos por adelantado ya que esto es diferente dependiendo de la cobertura de su seguro. Sin embargo, es posible que podamos encontrar un medicamento sustituto a Audiological scientist un formulario para que el seguro [place]  medicamento que se considera necesario.   Si se requiere una autorizacin previa para que su compaa de seguros [place] su medicamento, por favor permtanos de 1 a 2 das hbiles para completar [address]  Los precios de los medicamentos varan con frecuencia dependiendo del Environmental consultant de dnde se surte la receta y alguna farmacias pueden ofrecer precios ms baratos.  El sitio web www.goodrx.com tiene cupones para medicamentos de Health and safety inspector. Los precios aqu no tienen en cuenta lo que podra costar con la ayuda del seguro (puede ser ms barato con su seguro), pero el sitio web puede darle el precio si no utiliz Tourist information centre manager.  - Puede imprimir el cupn correspondiente y llevarlo con su receta a la farmacia.  - Tambin puede pasar por nuestra oficina durante el horario de atencin regular y Education officer, museum una tarjeta de cupones de GoodRx.  - Si necesita que su receta se enve electrnicamente a una farmacia  diferente, informe a nuestra oficina a travs de MyChart de [place] o por telfono llamando al [phone] y presione la opcin 4.

## 2023-02-20 DIAGNOSIS — H903 Sensorineural hearing loss, bilateral: Secondary | ICD-10-CM | POA: Diagnosis not present

## 2023-02-20 DIAGNOSIS — J309 Allergic rhinitis, unspecified: Secondary | ICD-10-CM | POA: Diagnosis not present

## 2023-02-21 ENCOUNTER — Other Ambulatory Visit: Payer: Self-pay | Admitting: Unknown Physician Specialty

## 2023-02-21 DIAGNOSIS — Z0100 Encounter for examination of eyes and vision without abnormal findings: Secondary | ICD-10-CM | POA: Diagnosis not present

## 2023-02-21 DIAGNOSIS — J329 Chronic sinusitis, unspecified: Secondary | ICD-10-CM

## 2023-02-22 ENCOUNTER — Encounter: Payer: Self-pay | Admitting: Dermatology

## 2023-02-25 ENCOUNTER — Inpatient Hospital Stay
Admission: RE | Admit: 2023-02-25 | Discharge: 2023-02-25 | Disposition: A | Discharge status: Home / Self Care | Payer: Medicare PPO | Source: Ambulatory Visit | Attending: Unknown Physician Specialty | Admitting: Unknown Physician Specialty

## 2023-02-25 DIAGNOSIS — J329 Chronic sinusitis, unspecified: Secondary | ICD-10-CM

## 2023-03-04 ENCOUNTER — Ambulatory Visit: Payer: Medicare PPO | Admitting: Dermatology

## 2023-03-04 ENCOUNTER — Encounter: Payer: Self-pay | Admitting: Dermatology

## 2023-03-04 DIAGNOSIS — Z7189 Other specified counseling: Secondary | ICD-10-CM | POA: Diagnosis not present

## 2023-03-04 DIAGNOSIS — L820 Inflamed seborrheic keratosis: Secondary | ICD-10-CM

## 2023-03-04 DIAGNOSIS — L244 Irritant contact dermatitis due to drugs in contact with skin: Secondary | ICD-10-CM

## 2023-03-04 DIAGNOSIS — N481 Balanitis: Secondary | ICD-10-CM

## 2023-03-04 DIAGNOSIS — B3742 Candidal balanitis: Secondary | ICD-10-CM | POA: Diagnosis not present

## 2023-03-04 DIAGNOSIS — L988 Other specified disorders of the skin and subcutaneous tissue: Secondary | ICD-10-CM

## 2023-03-04 NOTE — Progress Notes (Signed)
   Follow-Up Visit   Subjective  Tony Cox is a 72 y.o. 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   The following portions of the chart were reviewed this encounter and updated as appropriate: medications, allergies, medical history  Review of Systems:  No other skin or systemic complaints except as noted in HPI or Assessment and Plan.  Objective  Well appearing patient in no apparent distress; mood and affect are within normal limits.   A focused examination was performed of the following areas: Face, groin  Relevant exam findings are noted in the Assessment and Plan.  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 Condition is symptomatic / bothersome to patient. Not to goal. Treatment Plan: Resolved with Diflucan and Doxycycline Advised pt if he flares again he can restart Diflucan 200mg  1 po 3x/wk Monday, Wednesday, Friday for 1 week only  Facial Elastosis face Botox 7.25 units injected today to: - Botox comma 1.25 units to L botox comma, 1.0 unit to R botox comma - Crow's feet 2.5 units x 2 Location: botox coma bil, crow's feet bil Informed consent: Discussed risks (infection, pain, bleeding, bruising, swelling, allergic reaction, paralysis of nearby muscles, eyelid droop, double vision, neck weakness,  difficulty breathing, headache, undesirable cosmetic result, and need for additional treatment) and benefits of the procedure, as well as the alternatives.  Informed consent was obtained.  Preparation: The area was cleansed with alcohol.  Procedure Details:  Botox was injected into the dermis with a 30-gauge needle. Pressure applied to any bleeding. Ice packs offered for swelling.  Lot Number:  [id number] Expiration:  12/2024  Total Units Injected:  7.25  Plan: Tylenol may be used for headache.  Allow 2 weeks before returning to clinic for additional dosing as needed. Patient will call for any problems.   Inflamed seborrheic keratosis (5) forehead x 4, scalp x 1  Symptomatic, irritating, patient would like treated.  Destruction of lesion - forehead x 4, scalp x 1 (5) Complexity: simple   Destruction method: cryotherapy   Informed consent: discussed and consent obtained   Timeout:  patient name, date of birth, surgical site, and procedure verified Lesion destroyed using liquid nitrogen: Yes   Region frozen until ice ball extended beyond lesion: Yes   Outcome: patient tolerated procedure well with no complications   Post-procedure details: wound care instructions given      Return if symptoms worsen or fail to improve.  I, Ardis Rowan, RMA, am acting as scribe for Armida Sans, MD .   Documentation: I have reviewed the above documentation for accuracy and completeness, and I agree with the above.  Armida Sans, MD

## 2023-03-04 NOTE — Patient Instructions (Addendum)
When you get the yeast in the groin you will take the Diflucan 200mg  1 pill on Monday, Wednesday, Friday for one week only per episode.  You do have a refill at your pharmacy.   Cryotherapy Aftercare  Wash gently with soap and water everyday.   Apply Vaseline and Band-Aid daily until healed.    Due to recent changes in healthcare laws, you may see results of your pathology and/or laboratory studies on MyChart before the doctors have had a chance to review them. We understand that in some cases there may be results that are confusing or concerning to you. Please understand that not all results are received at the same time and often the doctors may need to interpret multiple results in order to provide you with the best plan of care or course of treatment. Therefore, we ask that you please give [place] 2 business days to thoroughly review all your results before contacting the office for clarification. Should we see a critical lab result, you will be contacted sooner.   If You Need Anything After Your Visit  If you have any questions or concerns for your doctor, please call our main line at [phone] and press option 4 to reach your doctor's medical assistant. If no one answers, please leave a voicemail as directed and we will return your call as soon as possible. Messages left after 4 pm will be answered the following business day.   You may also send [place] a message via MyChart. We typically respond to MyChart messages within 1-2 business days.  For prescription refills, please ask your pharmacy to contact our office. Our fax number is [phone].  If you have an urgent issue when the clinic is closed that cannot wait until the next business day, you can page your doctor at the number below.    Please note that while we do our best to be available for urgent issues outside of office hours, we are not available 24/7.   If you have an urgent issue and are unable to reach [place], you may choose to  seek medical care at your doctor's office, retail clinic, urgent care center, or emergency room.  If you have a medical emergency, please immediately call 911 or go to the emergency department.  Pager Numbers  - Dr. Gwen Pounds: [phone]  - Dr. Roseanne Reno: [phone]  - Dr. Katrinka Blazing: [phone]   In the event of inclement weather, please call our main line at [phone] for an update on the status of any delays or closures.  Dermatology Medication Tips: Please keep the boxes that topical medications come in in order to help keep track of the instructions about where and how to use these. Pharmacies typically print the medication instructions only on the boxes and not directly on the medication tubes.   If your medication is too expensive, please contact our office at [phone] option 4 or send [place] a message through MyChart.   We are unable to tell what your co-pay for medications will be in advance as this is different depending on your insurance coverage. However, we may be able to find a substitute medication at lower cost or fill out paperwork to get insurance to cover a needed medication.   If a prior authorization is required to get your medication covered by your insurance company, please allow [place] 1-2 business days to complete this process.  Drug prices often vary depending on where the prescription is filled and some pharmacies may offer cheaper prices.  The website www.goodrx.com contains coupons for medications through different pharmacies. The prices here do not account for what the cost may be with help from insurance (it may be cheaper with your insurance), but the website can give you the price if you did not use any insurance.  - You can print the associated coupon and take it with your prescription to the pharmacy.  - You may also stop by our office during regular business hours and pick up a GoodRx coupon card.  - If you need your prescription sent electronically to a  different pharmacy, notify our office through [facility] or by phone at [phone] option 4.     Si Usted Necesita Algo Despus de Su Visita  Tambin puede enviarnos un mensaje a travs de Clinical cytogeneticist. Por lo general respondemos a los mensajes de MyChart en el transcurso de 1 a 2 das hbiles.  Para renovar recetas, por favor pida a su farmacia que se ponga en contacto con nuestra oficina. Annie Sable de fax es [place] [phone].  Si tiene un asunto urgente cuando la clnica est cerrada y que no puede esperar hasta el siguiente da hbil, puede llamar/localizar a su doctor(a) al nmero que aparece a continuacin.   Por favor, tenga en cuenta que aunque hacemos todo lo posible para estar disponibles para asuntos urgentes fuera del horario de [place], no estamos disponibles las 24 horas del da, los 7 [address]  [address] de la [place].   Si tiene un problema urgente y no puede comunicarse con nosotros, puede optar por buscar atencin mdica  en el consultorio de su doctor(a), en una clnica privada, en un centro de atencin urgente o en una sala de emergencias.  Si tiene Engineer, drilling, por favor llame inmediatamente al 911 o vaya a la sala de emergencias.  Nmeros de bper  - Dr. Gwen Pounds: [phone]  - Dra. Roseanne Reno: [phone]  - Dr. Katrinka Blazing: [phone]   En caso de inclemencias del tiempo, por favor llame a Lacy Duverney principal al [phone] para una actualizacin sobre el [place] de cualquier retraso o cierre.  Consejos para la medicacin en dermatologa: Por favor, guarde las cajas en las que vienen los medicamentos de uso tpico para ayudarle a seguir las instrucciones sobre dnde y cmo usarlos. Las farmacias generalmente imprimen las instrucciones del medicamento slo en las cajas y no directamente en los tubos del [place].   Si su medicamento es muy caro, por favor, pngase en contacto con Rolm Gala llamando al [phone] y presione la opcin 4 o envenos un  mensaje a travs de Clinical cytogeneticist.   No podemos decirle cul ser su copago por los medicamentos por adelantado ya que esto es diferente dependiendo de la cobertura de su seguro. Sin embargo, es posible que podamos encontrar un medicamento sustituto a Audiological scientist un formulario para que el seguro cubra el medicamento que se considera necesario.   Si se requiere una autorizacin previa para que su compaa de seguros [place] su medicamento, por favor permtanos de 1 a 2 das hbiles para completar [address]  Los precios de los medicamentos varan con frecuencia dependiendo del Environmental consultant de dnde se surte la receta y alguna farmacias pueden ofrecer precios ms baratos.  El sitio web www.goodrx.com tiene cupones para medicamentos de Health and safety inspector. Los precios aqu no tienen en cuenta lo que podra costar con la ayuda del seguro (puede ser ms barato con su seguro), pero el sitio web puede darle el precio si no utiliz Tourist information centre manager.  -  Puede imprimir el cupn correspondiente y llevarlo con su receta a la farmacia.  - Tambin puede pasar por nuestra oficina durante el horario de atencin regular y Education officer, museum una tarjeta de cupones de GoodRx.  - Si necesita que su receta se enve electrnicamente a una farmacia diferente, informe a nuestra oficina a travs de MyChart de [place] o por telfono llamando al [phone] y presione la opcin 4.

## 2023-03-05 ENCOUNTER — Other Ambulatory Visit: Payer: Self-pay | Admitting: Internal Medicine

## 2023-03-05 ENCOUNTER — Telehealth: Payer: Self-pay | Admitting: Internal Medicine

## 2023-03-05 MED ORDER — CARVEDILOL PHOSPHATE ER 10 MG PO CP24: 10.0000 mg | ORAL_CAPSULE | Freq: Every day | ORAL | 1 refills | Status: DC

## 2023-03-12 ENCOUNTER — Other Ambulatory Visit: Payer: Self-pay | Admitting: Internal Medicine

## 2023-03-12 MED ORDER — CEFDINIR 300 MG PO CAPS: 300.0000 mg | ORAL_CAPSULE | Freq: Two times a day (BID) | ORAL | 0 refills | Status: DC

## 2023-03-12 MED ORDER — ATROVENT HFA 17 MCG/ACT IN AERS: 2.0000 | INHALATION_SPRAY | Freq: Four times a day (QID) | RESPIRATORY_TRACT | 12 refills | Status: DC | PRN

## 2023-03-12 MED ORDER — PREDNISONE 10 MG PO TABS: ORAL_TABLET | ORAL | 0 refills | Status: DC

## 2023-03-13 ENCOUNTER — Other Ambulatory Visit: Payer: Self-pay | Admitting: Dermatology

## 2023-03-14 ENCOUNTER — Other Ambulatory Visit: Payer: Self-pay | Admitting: Urology

## 2023-03-14 MED ORDER — TRAMADOL HCL 50 MG PO TABS: 50.0000 mg | ORAL_TABLET | Freq: Four times a day (QID) | ORAL | 0 refills | Status: AC | PRN

## 2023-04-03 ENCOUNTER — Other Ambulatory Visit: Payer: Self-pay | Admitting: Internal Medicine

## 2023-04-03 ENCOUNTER — Other Ambulatory Visit: Payer: Self-pay

## 2023-04-03 MED ORDER — CARVEDILOL PHOSPHATE ER 10 MG PO CP24: 10.0000 mg | ORAL_CAPSULE | Freq: Every day | ORAL | 1 refills | Status: DC

## 2023-04-03 NOTE — Telephone Encounter (Signed)
Pt is requesting a refill however pt stated that he is taking two daily, the rx in chart states one tablet daily.

## 2023-04-03 NOTE — Telephone Encounter (Signed)
Prescription Request  04/03/2023  LOV: 07/05/2022  What is the name of the medication or equipment? Carvedilol 20mg - pt takes two a day of generic because the one a day of the 10mg   was not working as a well as brand name  Have you contacted your pharmacy to request a refill? No   [facility]   Patient notified that their request is being sent to the clinical staff for review and that they should receive a response within 2 business days.   Please advise at Mobile [phone] (mobile)   Brett Canales from [facility]-336 [phone]

## 2023-04-07 NOTE — Telephone Encounter (Signed)
[facility] called. Patient's insurance is no longer covering name brand. Patient is taking the generic Carvedilol Phosphate. Because it's a generic, Brett Canales From [facility] said patient started taking 2 a day because of palpitations, per patient. You can call Brett Canales at [phone].

## 2023-04-08 ENCOUNTER — Encounter: Payer: Self-pay | Admitting: Internal Medicine

## 2023-04-09 ENCOUNTER — Encounter: Payer: Self-pay | Admitting: Internal Medicine

## 2023-04-09 MED ORDER — CARVEDILOL 12.5 MG PO TABS: 12.5000 mg | ORAL_TABLET | Freq: Two times a day (BID) | ORAL | 0 refills | Status: DC

## 2023-04-09 NOTE — Addendum Note (Signed)
Addended by: Sherlene Shams on: 04/09/2023 10:28 PM   Modules accepted: Orders

## 2023-04-09 NOTE — Addendum Note (Signed)
Addended by: Sherlene Shams on: 04/09/2023 10:24 PM   Modules accepted: Orders

## 2023-04-10 ENCOUNTER — Other Ambulatory Visit: Payer: Self-pay | Admitting: Internal Medicine

## 2023-04-10 MED ORDER — CARVEDILOL PHOSPHATE ER 20 MG PO CP24: 20.0000 mg | ORAL_CAPSULE | Freq: Every day | ORAL | 2 refills | Status: DC

## 2023-04-14 ENCOUNTER — Ambulatory Visit: Payer: Medicare PPO

## 2023-04-14 ENCOUNTER — Ambulatory Visit: Payer: Medicare PPO | Admitting: Family Medicine

## 2023-04-14 ENCOUNTER — Encounter: Payer: Self-pay | Admitting: Family

## 2023-04-14 ENCOUNTER — Ambulatory Visit: Payer: Medicare PPO | Admitting: Family

## 2023-04-14 VITALS — BP 118/70 | HR 76 | Temp 97.9°F | Resp 16 | Ht 66.0 in | Wt 156.0 lb

## 2023-04-14 DIAGNOSIS — R059 Cough, unspecified: Secondary | ICD-10-CM

## 2023-04-14 DIAGNOSIS — R911 Solitary pulmonary nodule: Secondary | ICD-10-CM | POA: Diagnosis not present

## 2023-04-14 DIAGNOSIS — R918 Other nonspecific abnormal finding of lung field: Secondary | ICD-10-CM | POA: Diagnosis not present

## 2023-04-14 MED ORDER — BENZONATATE 100 MG PO CAPS: 100.0000 mg | ORAL_CAPSULE | Freq: Three times a day (TID) | ORAL | 0 refills | Status: DC | PRN

## 2023-04-14 MED ORDER — HYDROCOD POLI-CHLORPHE POLI ER 10-8 MG/5ML PO SUER: 5.0000 mL | Freq: Two times a day (BID) | ORAL | 0 refills | Status: DC | PRN

## 2023-04-14 NOTE — Progress Notes (Signed)
Acute Office Visit  Subjective:     Patient ID: Tony Cox, male    DOB: 23-Nov-1950, 72 y.o.   MRN: [id number]  Chief Complaint  Patient presents with  . Cough         HPI Patient is in today with c/o cough, congestion, fatigue and windedness. He has 3 more days of Ceftin and is using Atrovent. Would like a refill on Tessalon Perles and Tussionex. He reports his pulse ox at 92% on his apple watch but here is at 99%.   Review of Systems  Constitutional: Negative.   HENT:  Positive for congestion.   Respiratory:  Positive for cough. Negative for shortness of breath and wheezing.   Cardiovascular: Negative.   Gastrointestinal: Negative.   Musculoskeletal: Negative.   Skin: Negative.   Neurological: Negative.   Endo/Heme/Allergies: Negative.   Psychiatric/Behavioral: Negative.    All other systems reviewed and are negative.  Past Medical History:  Diagnosis Date  . Atrial fibrillation (HCC)   . Cancer (HCC) 2015   T2 uroepithelial bladder carcinoma   . Depression   . Dysrhythmia   . GERD (gastroesophageal reflux disease)   . Heart murmur   . 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 History of atrial fibrillation without current medication    POST OP MITRIAL VALVE REPLACEMENT IN 2004  . 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 Hyperlipidemia   . Hypertension   . Neuromuscular disorder [facility])     Social History   Socioeconomic History  . Marital status: Single    Spouse name: Not on file  . Number of children: Not on file  . Years of education: Not on file  . Highest education level: Not on file  Occupational History  . Not on file  Tobacco Use  . Smoking status: Former    Current packs/day: 0.00    Types: Cigarettes    Quit date: 04/22/2012    Years since quitting: 10.9  . Smokeless tobacco: Never  Vaping Use   . Vaping status: Never Used  Substance and Sexual Activity  . Alcohol use: Yes    Alcohol/week: 1.0 standard drink of alcohol    Types: 1 Standard drinks or equivalent per week    Comment: occasional  . Drug use: No  . Sexual activity: Not on file  Other Topics Concern  . Not on file  Social History Narrative   High school Retail banker    Social Drivers of Health   Financial Resource Strain: Low Risk  (06/30/2022)   [facility]   Overall Financial Resource Strain (CARDIA)   . Difficulty of Paying Living Expenses: Not hard at all  Food Insecurity: No Food Insecurity (07/02/2022)   Hunger Vital Sign   . Worried About Programme researcher, broadcasting/film/video in the Last Year: Never true   . Ran Out of Food in the Last Year: Never true  Transportation Needs: No Transportation Needs (07/02/2022)   PRAPARE - Transportation   . Lack of Transportation (Medical): No   . Lack of Transportation (Non-Medical): No  Physical Activity: Not on file  Stress: Not on file  Social Connections: Not on file  Intimate Partner Violence: Not on file    Past Surgical History:  Procedure Laterality Date  . CARDIAC CATHETERIZATION    . CATARACT  EXTRACTION W/PHACO Left 11/20/2015   Procedure: CATARACT EXTRACTION PHACO AND INTRAOCULAR LENS PLACEMENT (IOC);  Surgeon: Galen Manila, MD;  Location: [facility] ORS;  Service: Ophthalmology;  Laterality: Left;  [place] AP% CDE fluid pack lot # [id number]  . CHOLECYSTECTOMY  2011  . COLONOSCOPY  2009, 2013   Dr Lorenz Coaster ADENOMA   . COLONOSCOPY WITH PROPOFOL N/A 07/24/2016   Procedure: COLONOSCOPY WITH PROPOFOL;  Surgeon: Earline Mayotte, MD;  Location: [facility];  Service: Endoscopy;  Laterality: N/A;  . COLONOSCOPY WITH PROPOFOL N/A 07/04/2021   Procedure: COLONOSCOPY WITH PROPOFOL;  Surgeon: Earline Mayotte, MD;  Location: [facility] ENDOSCOPY;  Service: Endoscopy;  Laterality: N/A;  1ST CASE PER OFFICE  . cystectomy  2014   ileal  conduit diversion   . HERNIA REPAIR  2011   umbilical   . MITRAL VALVE REPLACEMENT    . TONSILLECTOMY  AS CHILD  . TRANSTHORACIC ECHOCARDIOGRAM  11-03-2008  DR FATH Nicholes Rough)   LVF NORMAL/ EF 55-60%/ LEFT ATRIAL ENLARGEMENT/  ADEQUATELY FUNCTIONING MITRAL VALVE REPAIR/  MILD MR & TRI/  NO SIGNIFICANT CHANGE FROM PREVIOUS ECHO  . TRANSURETHRAL RESECTION OF BLADDER TUMOR N/A 07/27/2012   Procedure: TRANSURETHRAL RESECTION OF BLADDER TUMOR (TURBT);  Surgeon: Marcine Matar, MD;  Location[facility];  Service: Urology;  Laterality: N/A;  WITH MITOMYCIN INSTILLATION   . urethroptomy  11/05/2016   [place]  . valvuloplasty with replacement of cosgrove ring,1 congenital ruptur of cordi  2004   [facility]   mitral valve     Family History  Adopted: Yes  Problem Relation Age of Onset  . Colon cancer Mother   . Cancer Neg Hx        Prostate,Kidney,Bladder  . Prostate cancer Neg Hx   . Bladder Cancer Neg Hx   . Kidney cancer Neg Hx     Allergies  Allergen Reactions  . Codeine Nausea And Vomiting and Hives    Current Outpatient Medications on File Prior to Visit  Medication Sig Dispense Refill  . carvedilol (COREG CR) 20 MG 24 hr capsule Take 1 capsule (20 mg total) by mouth daily. 90 capsule 2  . cefdinir (OMNICEF) 300 MG capsule Take 1 capsule (300 mg total) by mouth 2 (two) times daily. 14 capsule 0  . clobetasol cream (TEMOVATE) 0.05 % Apply 1 Application topically 2 (two) times daily. Apply to poison ivy on arm bid until clear, avoid face, groin, axilla 60 g 0  . DEXILANT 60 MG capsule TAKE 1 CAPSULE EVERY DAY 90 capsule 3  . dicyclomine (BENTYL) 10 MG capsule TAKE 1 CAPSULE TWICE DAILY 180 capsule 3  . doxylamine, Sleep, (UNISOM SLEEPTABS) 25 MG tablet Take by mouth.    . fluconazole (DIFLUCAN) 200 MG tablet Take 1 tablet (200 mg total) by mouth 3 (three) times a week. Take 1 pill 3 times weekly, Monday, Wednesday, Friday 12 tablet 1  . gabapentin  (NEURONTIN) 300 MG capsule TAKE 1 CAPSULE TWICE DAILY 180 capsule 3  . ipratropium (ATROVENT HFA) 17 MCG/ACT inhaler Inhale 2 puffs into the lungs every 6 (six) hours as needed for wheezing. 1 each 12  . metroNIDAZOLE (METROGEL) 1 % gel Apply topically daily. Apply to face qhs for Rosacea 45 g 6  . mirtazapine (REMERON SOL-TAB) 15 MG disintegrating tablet DISSOLVE 1 TABLET ON THE TONGUE AT BEDTIME 90 tablet 3  . mometasone (ELOCON) 0.1 % cream Apply 1 Application topically daily as needed (Rash). qd up to 5 days a week  to itchy rash on body until clear, then prn flares, avoid face, groin, axilla 45 g 6  . nystatin (MYCOSTATIN/NYSTOP) powder Apply 1 Application topically 3 (three) times daily. 60 g 0  . Probiotic Product (PROBIOTIC & ACIDOPHILUS EX ST PO) Take 2 tablets by mouth at bedtime as needed.    . traMADol (ULTRAM) 50 MG tablet Take 1 tablet (50 mg total) by mouth every 6 (six) hours as needed. 30 tablet 0  . valACYclovir (VALTREX) 1000 MG tablet Take 1 tablet (1,000 mg total) by mouth 2 (two) times daily. 30 tablet 11   No current facility-administered medications on file prior to visit.    BP 118/70   Pulse 76   Temp 97.9 F (36.6 C)   Resp 16   Ht 5\' 6"  (1.676 m)   Wt 156 lb (70.8 kg)   SpO2 99%   BMI 25.18 kg/m chart     Objective:    BP 118/70   Pulse 76   Temp 97.9 F (36.6 C)   Resp 16   Ht 5\' 6"  (1.676 m)   Wt 156 lb (70.8 kg)   SpO2 99%   BMI 25.18 kg/m    Physical Exam Vitals and nursing note reviewed.  Constitutional:      Appearance: Normal appearance. He is normal weight.  HENT:     Right Ear: Tympanic membrane, ear canal and external ear normal.     Left Ear: Tympanic membrane, ear canal and external ear normal.     Mouth/Throat:     Mouth: Mucous membranes are moist.  Cardiovascular:     Rate and Rhythm: Normal rate and regular rhythm.  Pulmonary:     Effort: Pulmonary effort is normal.     Breath sounds: Normal breath sounds.   Musculoskeletal:        General: Normal range of motion.  Skin:    General: Skin is warm and dry.  Neurological:     General: No focal deficit present.     Mental Status: He is alert and oriented to person, place, and time. Mental status is at baseline.  Psychiatric:        Mood and Affect: Mood normal.        Behavior: Behavior normal.   No results found for any visits on 04/14/23.      Assessment & Plan:   Problem List Items Addressed This Visit   None Visit Diagnoses       Cough, unspecified type    -  Primary   Relevant Orders   DG Chest 2 View       Meds ordered this encounter  Medications  . benzonatate (TESSALON PERLES) 100 MG capsule    Sig: Take 1 capsule (100 mg total) by mouth 3 (three) times daily as needed for cough.    Dispense:  20 capsule    Refill:  0  . chlorpheniramine-HYDROcodone (TUSSIONEX) 10-8 MG/5ML    Sig: Take 5 mLs by mouth every 12 (twelve) hours as needed for cough.    Dispense:  115 mL    Refill:  0   Chest x-ray ordered. Will notify patient pending results. Continue antibiotic. Call if symptoms worsen or persist. Recheck as scheduled and sooner as needed.  No follow-ups on file.  Eulis Foster, FNP

## 2023-04-15 ENCOUNTER — Telehealth: Payer: Self-pay | Admitting: Internal Medicine

## 2023-04-15 NOTE — Telephone Encounter (Signed)
Spoke to pt. Pt was calling to see if xray results where back informed pt they where not back yet and that we would give him a call once they came back.

## 2023-04-15 NOTE — Telephone Encounter (Unsigned)
Copied from CRM [id number]. Topic: General - Call Back - No Documentation >> Apr 15, 2023 12:27 PM Deaijah H wrote: Reason for CRM: Patient called in to speak with Worthy Rancher & would like to be reached at [phone]

## 2023-04-17 ENCOUNTER — Telehealth: Payer: Self-pay

## 2023-04-17 NOTE — Telephone Encounter (Signed)
Spoke with pt he was wanting to know why in his AVS there was information about bronchitis because the NP never mentioned any of that to him. I advised pt that the NP was out of the office. Pt was just curious. Pt did say that he has finished the antibiotics but has not gotten much better. Pt was scheduled to see Dr. Clent Ridges tomorrow but would like to cancel that appt and see Dr. Darrick Huntsman on Tuesday. Pt has been scheduled to see Dr. Darrick Huntsman on Tuesday and advised that if symptoms get worse he would need to be evaluated sooner. Pt gave a verbal understanding.

## 2023-04-17 NOTE — Telephone Encounter (Signed)
Copied from CRM [id number]. Topic: Clinical - Medical Advice >> Apr 17, 2023 10:47 AM Elizebeth Brooking wrote: Reason for CRM: Patient is requesting a callback would like to speak with PA

## 2023-04-18 ENCOUNTER — Ambulatory Visit: Payer: Medicare PPO | Admitting: Family Medicine

## 2023-04-18 ENCOUNTER — Ambulatory Visit: Payer: Medicare PPO | Admitting: Nurse Practitioner

## 2023-04-18 ENCOUNTER — Telehealth: Payer: Self-pay | Admitting: Internal Medicine

## 2023-04-18 NOTE — Telephone Encounter (Signed)
Lm provider out today. Appointment move to 4pm, 04/18/2023 with Tony Cox.

## 2023-04-22 ENCOUNTER — Ambulatory Visit: Payer: Medicare PPO | Admitting: Internal Medicine

## 2023-04-22 ENCOUNTER — Encounter: Payer: Self-pay | Admitting: Internal Medicine

## 2023-04-22 ENCOUNTER — Ambulatory Visit: Payer: Medicare PPO | Attending: Internal Medicine

## 2023-04-22 VITALS — BP 110/56 | HR 60 | Ht 66.0 in | Wt 156.4 lb

## 2023-04-22 DIAGNOSIS — Z8679 Personal history of other diseases of the circulatory system: Secondary | ICD-10-CM

## 2023-04-22 DIAGNOSIS — J189 Pneumonia, unspecified organism: Secondary | ICD-10-CM | POA: Insufficient documentation

## 2023-04-22 DIAGNOSIS — R9389 Abnormal findings on diagnostic imaging of other specified body structures: Secondary | ICD-10-CM

## 2023-04-22 DIAGNOSIS — I480 Paroxysmal atrial fibrillation: Secondary | ICD-10-CM

## 2023-04-22 DIAGNOSIS — Z8701 Personal history of pneumonia (recurrent): Secondary | ICD-10-CM

## 2023-04-22 DIAGNOSIS — R5383 Other fatigue: Secondary | ICD-10-CM

## 2023-04-22 DIAGNOSIS — Z8551 Personal history of malignant neoplasm of bladder: Secondary | ICD-10-CM

## 2023-04-22 DIAGNOSIS — R918 Other nonspecific abnormal finding of lung field: Secondary | ICD-10-CM | POA: Diagnosis not present

## 2023-04-22 DIAGNOSIS — R0989 Other specified symptoms and signs involving the circulatory and respiratory systems: Secondary | ICD-10-CM

## 2023-04-22 LAB — CBC WITH DIFFERENTIAL/PLATELET
Basophils Absolute: 0.1 10*3/uL (ref 0.0–0.1)
Basophils Relative: 1.1 % (ref 0.0–3.0)
Eosinophils Absolute: 0.2 10*3/uL (ref 0.0–0.7)
Eosinophils Relative: 4 % (ref 0.0–5.0)
HCT: 44.3 % (ref 39.0–52.0)
Hemoglobin: 14.8 g/dL (ref 13.0–17.0)
Lymphocytes Relative: 31.5 % (ref 12.0–46.0)
Lymphs Abs: 1.6 10*3/uL (ref 0.7–4.0)
MCHC: 33.4 g/dL (ref 30.0–36.0)
MCV: 92 fL (ref 78.0–100.0)
Monocytes Absolute: 0.6 10*3/uL (ref 0.1–1.0)
Monocytes Relative: 11.3 % (ref 3.0–12.0)
Neutro Abs: 2.7 10*3/uL (ref 1.4–7.7)
Neutrophils Relative %: 52.1 % (ref 43.0–77.0)
Platelets: 235 10*3/uL (ref 150.0–400.0)
RBC: 4.82 Mil/uL (ref 4.22–5.81)
RDW: 13 % (ref 11.5–15.5)
WBC: 5.1 10*3/uL (ref 4.0–10.5)

## 2023-04-22 LAB — COMPREHENSIVE METABOLIC PANEL
ALT: 21 U/L (ref 0–53)
AST: 21 U/L (ref 0–37)
Albumin: 4.4 g/dL (ref 3.5–5.2)
Alkaline Phosphatase: 55 U/L (ref 39–117)
BUN: 13 mg/dL (ref 6–23)
CO2: 31 meq/L (ref 19–32)
Calcium: 9.3 mg/dL (ref 8.4–10.5)
Chloride: 102 meq/L (ref 96–112)
Creatinine, Ser: 0.73 mg/dL (ref 0.40–1.50)
GFR: 90.65 mL/min (ref >60.00)
Glucose, Bld: 69 mg/dL — ABNORMAL LOW (ref 70–99)
Potassium: 4.3 meq/L (ref 3.5–5.1)
Sodium: 140 meq/L (ref 135–145)
Total Bilirubin: 0.5 mg/dL (ref 0.2–1.2)
Total Protein: 6.7 g/dL (ref 6.0–8.3)

## 2023-04-22 LAB — BRAIN NATRIURETIC PEPTIDE: Pro B Natriuretic peptide (BNP): 87 pg/mL (ref 0.0–100.0)

## 2023-04-22 MED ORDER — AZITHROMYCIN 500 MG PO TABS: 500.0000 mg | ORAL_TABLET | Freq: Every day | ORAL | 0 refills | Status: DC

## 2023-04-22 MED ORDER — DOXYCYCLINE HYCLATE 100 MG PO TABS
100.0000 mg | ORAL_TABLET | Freq: Two times a day (BID) | ORAL | 0 refills | Status: DC
Start: 2023-04-22 — End: 2023-06-10

## 2023-04-22 NOTE — Assessment & Plan Note (Addendum)
Done during [facility] hospitalization in march 2024. Given recurrent PNA,   a  Follow up CT is needed along with pulmonary consult.

## 2023-04-22 NOTE — Patient Instructions (Addendum)
 I am treating you for community acquire pneumonia with 2 different antibiotics:  azithromycin  and doxycycline  ( to cover  atypical pneumonias and MRSA)   I recommend you have a CT chest and pulmonary follow up (either here or at [facility])     I will try to reach Dr Tobie to order a ZIO monitor and coordinate with follow up

## 2023-04-22 NOTE — Assessment & Plan Note (Signed)
 I have ordered and reviewed a 12 lead EKG and find that there are no acute changes and patient is in sinus rhythm.   However since his medication change he has noted episodes of tachycardia and may be having PAF.  Discussed ordering a ZIO monitor through his cardiologist at [facility]. Dr Tobie.

## 2023-04-22 NOTE — Progress Notes (Addendum)
 Subjective:  Patient ID: Tony Cox, male    DOB: 1950-05-12  Age: 72 y.o. MRN: [id number]  CC: The primary encounter diagnosis was Personal history of bladder cancer. Diagnoses of History of community acquired pneumonia, Abnormal chest x-ray, Rales, Paroxysmal atrial fibrillation (HCC), Community acquired pneumonia of right lower lobe of lung, Other fatigue, Abnormal CT scan of lung, and History of atrial flutter were also pertinent to this visit.   HPI Tony Cox presents for  Chief Complaint  Patient presents with   Medical Management of Chronic Issues    Follow up on chest x ray results, cough and SOBr,    Mr Hilleary is a 72 yr old male with a history of bladder cancer diagnosed in 2019,  treated without recurrence,  bilateral lower lobe community acquired pneumonia failing outpatient treatment requiring hospitalization at [facility] in March 2024 secondary to hypoxic respiratory failure and atrial flutter who presents for follow up on cough and dyspnea .  He has completed a course of cefdinir   7 days ,  last dose was 4 days ago,  and was evaluated by NP Douglass on Dec 23 .  Chest x ray was done on Dec 23 and notes bilateral lower lobe interstitial opacities    HE REMAINS SHORT oF BREATH,  BECOMES TACHYCARDIC   WITH SHORT FLIGHTS OF STAIRS.  RESTING HEART   IS STAYING < 70 .  BP 119/70.  Feeling pressure in chest  intermittently  coughing more.    Has been afebrile but was afebrile in March during PNA  has no energy.  Cough is dry,  occasional green sputum.   PAF:  has noted resting pulse in the 50's since changing from NBO Coreg  cr to carvedilol  CR  20 mg daily .  During present illness he has noted pulse increase to 120 with minor stair climbs and feels excessive fatigue.      Review of Systems;  Patient denies headache, fevers, malaise, unintentional weight loss, skin rash, eye pain, sinus congestion and sinus pain, sore throat, dysphagia,  hemoptysis , , dyspnea, wheezing, chest  pain,  orthopnea, edema, abdominal pain, nausea, melena, diarrhea, constipation, flank pain, dysuria, hematuria, urinary  Frequency, nocturia, numbness, tingling, seizures,  Focal weakness, Loss of consciousness,  Tremor, insomnia, depression, anxiety, and suicidal ideation.      Objective:  BP (!) 110/56   Pulse 60   Ht 5' 6 (1.676 m)   Wt 156 lb 6.4 oz (70.9 kg)   SpO2 99%   BMI 25.24 kg/m   BP Readings from Last 3 Encounters:  04/22/23 (!) 110/56  04/14/23 118/70  01/22/23 138/71    Wt Readings from Last 3 Encounters:  04/22/23 156 lb 6.4 oz (70.9 kg)  04/14/23 156 lb (70.8 kg)  01/02/23 152 lb 8 oz (69.2 kg)    Physical Exam Vitals reviewed.  Constitutional:      General: He is not in acute distress.    Appearance: Normal appearance. He is normal weight. He is not ill-appearing, toxic-appearing or diaphoretic.  HENT:     Head: Normocephalic.  Eyes:     General: No scleral icterus.       Right eye: No discharge.        Left eye: No discharge.     Conjunctiva/sclera: Conjunctivae normal.  Cardiovascular:     Rate and Rhythm: Normal rate and regular rhythm.     Heart sounds: Normal heart sounds.  Pulmonary:     Effort: Pulmonary effort  is normal.     Breath sounds: Rales present.     Comments: Bilateral crackles at bases  Musculoskeletal:        General: Normal range of motion.     Cervical back: Normal range of motion.  Skin:    General: Skin is warm and dry.  Neurological:     General: No focal deficit present.     Mental Status: He is alert and oriented to person, place, and time. Mental status is at baseline.  Psychiatric:        Mood and Affect: Mood normal.        Behavior: Behavior normal.        Thought Content: Thought content normal.        Judgment: Judgment normal.    Lab Results  Component Value Date   HGBA1C 5.7 06/05/2021    Lab Results  Component Value Date   CREATININE 0.73 04/22/2023   CREATININE 0.93 09/19/2021   CREATININE 0.90  06/05/2021    Lab Results  Component Value Date   WBC 5.1 04/22/2023   HGB 14.8 04/22/2023   HCT 44.3 04/22/2023   PLT 235.0 04/22/2023   GLUCOSE 69 (L) 04/22/2023   CHOL 176 06/05/2021   TRIG 129.0 06/05/2021   HDL 54.20 06/05/2021   LDLDIRECT 135.5 01/16/2010   LDLCALC 96 06/05/2021   ALT 21 04/22/2023   AST 21 04/22/2023   NA 140 04/22/2023   K 4.3 04/22/2023   CL 102 04/22/2023   CREATININE 0.73 04/22/2023   BUN 13 04/22/2023   CO2 31 04/22/2023   TSH 1.28 06/05/2021   PSA 2.1 10/19/2012   HGBA1C 5.7 06/05/2021   MICROALBUR 0.7 07/07/2012    No results found.  Assessment & Plan:  .Personal history of bladder cancer Assessment & Plan: He has had no recurrence after 6 years and continues to see his oncologist at [facility] and local follow up with [facility]. CT abd pelvis Oct 2024  no recurrence.    History of community acquired pneumonia -     Ambulatory referral to Pulmonology -     CT CHEST WO CONTRAST; Future  Abnormal chest x-ray  Rales -     Brain natriuretic peptide -     CBC with Differential/Platelet  Paroxysmal atrial fibrillation (HCC) -     EKG 12-Lead  Community acquired pneumonia of right lower lobe of lung Assessment & Plan: Symptoms stared one week after return from [place]. Have not resolved with empiric cefdinir  course.  Will treat with azithromycin  and doxcycline given age and history of hospitalization for CAP in march 2024.   Orders: -     Azithromycin ; Take 1 tablet (500 mg total) by mouth daily.  Dispense: 7 tablet; Refill: 0 -     Doxycycline  Hyclate; Take 1 tablet (100 mg total) by mouth 2 (two) times daily.  Dispense: 14 tablet; Refill: 0  Other fatigue -     Comprehensive metabolic panel  Abnormal CT scan of lung Assessment & Plan: Done during [facility] hospitalization in march 2024. Given recurrent PNA,   a  Follow up CT is needed along with pulmonary consult.   Orders: -     Ambulatory referral to  Pulmonology -     CT CHEST WO CONTRAST; Future  History of atrial flutter Assessment & Plan: I have ordered and reviewed a 12 lead EKG and find that there are no acute changes and patient is in sinus rhythm.   However since  his medication change he has noted episodes of tachycardia and may be having PAF.  Discussed ordering a ZIO monitor through his cardiologist at [facility]. Dr Tobie.       In addition  to time spent reading EKG   provided 44 minutes of face-to-face time during this encounter reviewing patient's last visit with me, patient's  most recent visit with cardiology,  nephrology,  and neurology,  recent surgical and non surgical procedures, previous  labs and imaging studies, counseling on currently addressed issues,  and post visit ordering to diagnostics and therapeutics .   Follow-up: Return in about 2 weeks (around 05/06/2023).   Verneita LITTIE Kettering, MD

## 2023-04-22 NOTE — Assessment & Plan Note (Signed)
Symptoms stared one week after return from [place]. Have not resolved with empiric cefdinir course.  Will treat with azithromycin and doxcycline given age and history of hospitalization for CAP in march 2024.

## 2023-04-22 NOTE — Assessment & Plan Note (Addendum)
 He has had no recurrence after 6 years and continues to see his oncologist at [facility] and local follow up with [facility]. CT abd pelvis Oct 2024  no recurrence.

## 2023-04-25 ENCOUNTER — Encounter: Payer: Self-pay | Admitting: Internal Medicine

## 2023-04-25 ENCOUNTER — Ambulatory Visit: Payer: Medicare PPO

## 2023-04-25 ENCOUNTER — Ambulatory Visit: Payer: Medicare PPO | Admitting: Internal Medicine

## 2023-04-25 ENCOUNTER — Encounter: Payer: Self-pay | Admitting: Pulmonary Disease

## 2023-04-25 VITALS — BP 104/56 | HR 63 | Ht 66.0 in | Wt 156.6 lb

## 2023-04-25 DIAGNOSIS — R0602 Shortness of breath: Secondary | ICD-10-CM

## 2023-04-25 DIAGNOSIS — J189 Pneumonia, unspecified organism: Secondary | ICD-10-CM | POA: Diagnosis not present

## 2023-04-25 NOTE — Assessment & Plan Note (Addendum)
 On Day 4 of azithromycin  doxycycline   completed cefdinir   7 days prior to evaluation  pre Christmas. Exam is reassuring that he appears to be improving,  but given his symptoms last night will repeat film Question if he has underlying bronchiectasis or aspiration  as a cause for his recurrent pneumonia.  Repeat film ordered for today,  modified barium swallow, CT chest and pulmonary consult ordered

## 2023-04-25 NOTE — Telephone Encounter (Signed)
 Pt scheduled

## 2023-04-25 NOTE — Patient Instructions (Signed)
 Continue the antibiotics , atrovent and REST\\\  REPEAT CHEST X RAY TODAY  CT CHEST   TO BE DONE,  HOPEFULLY BEFORE MEETING WITH GONZALEZ   SWALLOW EVALUATION TO RULE OUT RECURRENT ASPIRATION WILL BE ORDERED

## 2023-04-25 NOTE — Progress Notes (Signed)
 Subjective:  Patient ID: Tony Cox, male    DOB: 08-22-1950  Age: 73 y.o. MRN: [id number]  CC: The primary encounter diagnosis was Shortness of breath. Diagnoses of Recurrent pneumonia and Community acquired pneumonia, unspecified laterality were also pertinent to this visit.   HPI Tony Cox presents for  Chief Complaint  Patient presents with   Medical Management of Chronic Issues    Tony Cox was seen on Monday for follow up on lower respiratory infection that failed to resolve with cefdinir .  Cxr done Dec 23 noted bilateral LLL opacities and tx was resumed with dual antibitoics.  He returns today on Day 4 of azithromycin /doxycycline  with concerns of relapse after  improving transiently.  He has been resting,  but after visiting family members on jan 1 developed heaviness in chest and increased WOB that improved with use of atovent.  Cough has increased as well.  He denies fevers.  Has not had follow up CT after March 2024 hospitalization for PNA.   Feels he may be aspirating at times because he notes increase cough after eating/drinking  Patient was ambulated in office and room air sats were 98% pre ambulation,  95% post ambulatiom.  Pulse remained 65 ish Outpatient Medications Prior to Visit  Medication Sig Dispense Refill   azithromycin  (ZITHROMAX ) 500 MG tablet Take 1 tablet (500 mg total) by mouth daily. 7 tablet 0   benzonatate  (TESSALON  PERLES) 100 MG capsule Take 1 capsule (100 mg total) by mouth 3 (three) times daily as needed for cough. 20 capsule 0   carvedilol  (COREG  CR) 20 MG 24 hr capsule Take 1 capsule (20 mg total) by mouth daily. 90 capsule 2   chlorpheniramine -HYDROcodone (TUSSIONEX) 10-8 MG/5ML Take 5 mLs by mouth every 12 (twelve) hours as needed for cough. 115 mL 0   clobetasol  cream (TEMOVATE ) 0.05 % Apply 1 Application topically 2 (two) times daily. Apply to poison ivy on arm bid until clear, avoid face, groin, axilla 60 g 0   DEXILANT  60 MG capsule  TAKE 1 CAPSULE EVERY DAY 90 capsule 3   dicyclomine  (BENTYL ) 10 MG capsule TAKE 1 CAPSULE TWICE DAILY 180 capsule 3   doxycycline  (VIBRA -TABS) 100 MG tablet Take 1 tablet (100 mg total) by mouth 2 (two) times daily. 14 tablet 0   doxylamine, Sleep, (UNISOM SLEEPTABS) 25 MG tablet Take by mouth.     gabapentin  (NEURONTIN ) 300 MG capsule TAKE 1 CAPSULE TWICE DAILY 180 capsule 3   ipratropium (ATROVENT  HFA) 17 MCG/ACT inhaler Inhale 2 puffs into the lungs every 6 (six) hours as needed for wheezing. 1 each 12   metroNIDAZOLE  (METROGEL ) 1 % gel Apply topically daily. Apply to face qhs for Rosacea 45 g 6   mirtazapine  (REMERON  SOL-TAB) 15 MG disintegrating tablet DISSOLVE 1 TABLET ON THE TONGUE AT BEDTIME 90 tablet 3   mometasone  (ELOCON ) 0.1 % cream Apply 1 Application topically daily as needed (Rash). qd up to 5 days a week to itchy rash on body until clear, then prn flares, avoid face, groin, axilla 45 g 6   nystatin  (MYCOSTATIN /NYSTOP ) powder Apply 1 Application topically 3 (three) times daily. 60 g 0   Probiotic Product (PROBIOTIC & ACIDOPHILUS EX ST PO) Take 2 tablets by mouth at bedtime as needed.     traMADol  (ULTRAM ) 50 MG tablet Take 1 tablet (50 mg total) by mouth every 6 (six) hours as needed. 30 tablet 0   valACYclovir  (VALTREX ) 1000 MG tablet Take 1 tablet (1,000 mg  total) by mouth 2 (two) times daily. 30 tablet 11   No facility-administered medications prior to visit.    Review of Systems;  Patient denies headache, fevers, malaise, unintentional weight loss, skin rash, eye pain, sinus congestion and sinus pain, sore throat, dysphagia,  hemoptysis , cough, dyspnea, wheezing, chest pain, palpitations, orthopnea, edema, abdominal pain, nausea, melena, diarrhea, constipation, flank pain, dysuria, hematuria, urinary  Frequency, nocturia, numbness, tingling, seizures,  Focal weakness, Loss of consciousness,  Tremor, insomnia, depression, anxiety, and suicidal ideation.      Objective:  BP  (!) 104/56   Pulse 63   Ht 5' 6 (1.676 m)   Wt 156 lb 9.6 oz (71 kg)   SpO2 98%   BMI 25.28 kg/m   BP Readings from Last 3 Encounters:  04/25/23 (!) 104/56  04/22/23 (!) 110/56  04/14/23 118/70    Wt Readings from Last 3 Encounters:  04/25/23 156 lb 9.6 oz (71 kg)  04/22/23 156 lb 6.4 oz (70.9 kg)  04/14/23 156 lb (70.8 kg)    Physical Exam Vitals reviewed.  Constitutional:      General: He is not in acute distress.    Appearance: Normal appearance. He is normal weight. He is not ill-appearing, toxic-appearing or diaphoretic.  HENT:     Head: Normocephalic.  Eyes:     General: No scleral icterus.       Right eye: No discharge.        Left eye: No discharge.     Conjunctiva/sclera: Conjunctivae normal.  Cardiovascular:     Rate and Rhythm: Normal rate and regular rhythm.     Heart sounds: Normal heart sounds.  Pulmonary:     Effort: Pulmonary effort is normal. No respiratory distress.     Breath sounds: Normal breath sounds.  Musculoskeletal:        General: Normal range of motion.     Cervical back: Normal range of motion.  Skin:    General: Skin is warm and dry.  Neurological:     General: No focal deficit present.     Mental Status: He is alert and oriented to person, place, and time. Mental status is at baseline.  Psychiatric:        Mood and Affect: Mood normal.        Behavior: Behavior normal.        Thought Content: Thought content normal.        Judgment: Judgment normal.    Lab Results  Component Value Date   HGBA1C 5.7 06/05/2021    Lab Results  Component Value Date   CREATININE 0.73 04/22/2023   CREATININE 0.93 09/19/2021   CREATININE 0.90 06/05/2021    Lab Results  Component Value Date   WBC 5.1 04/22/2023   HGB 14.8 04/22/2023   HCT 44.3 04/22/2023   PLT 235.0 04/22/2023   GLUCOSE 69 (L) 04/22/2023   CHOL 176 06/05/2021   TRIG 129.0 06/05/2021   HDL 54.20 06/05/2021   LDLDIRECT 135.5 01/16/2010   LDLCALC 96 06/05/2021   ALT 21  04/22/2023   AST 21 04/22/2023   NA 140 04/22/2023   K 4.3 04/22/2023   CL 102 04/22/2023   CREATININE 0.73 04/22/2023   BUN 13 04/22/2023   CO2 31 04/22/2023   TSH 1.28 06/05/2021   PSA 2.1 10/19/2012   HGBA1C 5.7 06/05/2021   MICROALBUR 0.7 07/07/2012    No results found.  Assessment & Plan:  .Shortness of breath -     DG Chest  2 View; Future  Recurrent pneumonia -     SLP modified barium swallow; Future  Community acquired pneumonia, unspecified laterality Assessment & Plan: On Day 4 of azithromycin  doxycycline   completed cefdinir   7 days prior to evaluation  pre Christmas. Exam is reassuring that he appears to be improving,  but given his symptoms last night will repeat film Question if he has underlying bronchiectasis or aspiration  as a cause for his recurrent pneumonia.  Repeat film ordered for today,  modified barium swallow, CT chest and pulmonary consult ordered       I provided 30 minutes of face-to-face time during this encounter reviewing patient's last visit with me, patient's  most recent visit with cardiology,  nephrology,  and neurology,  recent surgical and non surgical procedures, previous  labs and imaging studies, counseling on currently addressed issues,  and post visit ordering to diagnostics and therapeutics .   Follow-up: No follow-ups on file.   Tony LITTIE Kettering, MD

## 2023-04-28 ENCOUNTER — Ambulatory Visit
Admission: RE | Admit: 2023-04-28 | Discharge: 2023-04-28 | Disposition: A | Discharge status: Home / Self Care | Payer: Medicare PPO | Source: Ambulatory Visit | Attending: Internal Medicine | Admitting: Internal Medicine

## 2023-04-28 DIAGNOSIS — J479 Bronchiectasis, uncomplicated: Secondary | ICD-10-CM | POA: Diagnosis not present

## 2023-04-28 DIAGNOSIS — Z8701 Personal history of pneumonia (recurrent): Secondary | ICD-10-CM | POA: Diagnosis not present

## 2023-04-28 DIAGNOSIS — R918 Other nonspecific abnormal finding of lung field: Secondary | ICD-10-CM | POA: Insufficient documentation

## 2023-04-28 DIAGNOSIS — I700 Atherosclerosis of aorta: Secondary | ICD-10-CM | POA: Diagnosis not present

## 2023-05-01 ENCOUNTER — Other Ambulatory Visit
Admission: RE | Admit: 2023-05-01 | Discharge: 2023-05-01 | Disposition: A | Discharge status: Home / Self Care | Payer: Medicare PPO | Source: Ambulatory Visit | Attending: Pulmonary Disease | Admitting: Pulmonary Disease

## 2023-05-01 ENCOUNTER — Encounter: Payer: Self-pay | Admitting: Pulmonary Disease

## 2023-05-01 ENCOUNTER — Ambulatory Visit: Payer: Medicare PPO | Admitting: Pulmonary Disease

## 2023-05-01 VITALS — BP 120/70 | HR 65 | Temp 97.1°F | Ht 66.0 in | Wt 160.2 lb

## 2023-05-01 DIAGNOSIS — J479 Bronchiectasis, uncomplicated: Secondary | ICD-10-CM | POA: Diagnosis not present

## 2023-05-01 DIAGNOSIS — J329 Chronic sinusitis, unspecified: Secondary | ICD-10-CM | POA: Diagnosis not present

## 2023-05-01 DIAGNOSIS — R918 Other nonspecific abnormal finding of lung field: Secondary | ICD-10-CM | POA: Insufficient documentation

## 2023-05-01 LAB — CBC WITH DIFFERENTIAL/PLATELET
Abs Immature Granulocytes: 0.02 10*3/uL (ref 0.00–0.07)
Basophils Absolute: 0.1 10*3/uL (ref 0.0–0.1)
Basophils Relative: 1 %
Eosinophils Absolute: 0.2 10*3/uL (ref 0.0–0.5)
Eosinophils Relative: 4 %
HCT: 41.5 % (ref 39.0–52.0)
Hemoglobin: 14 g/dL (ref 13.0–17.0)
Immature Granulocytes: 0 %
Lymphocytes Relative: 32 %
Lymphs Abs: 1.7 10*3/uL (ref 0.7–4.0)
MCH: 30.6 pg (ref 26.0–34.0)
MCHC: 33.7 g/dL (ref 30.0–36.0)
MCV: 90.6 fL (ref 80.0–100.0)
Monocytes Absolute: 0.6 10*3/uL (ref 0.1–1.0)
Monocytes Relative: 11 %
Neutro Abs: 2.7 10*3/uL (ref 1.7–7.7)
Neutrophils Relative %: 52 %
Platelets: 198 10*3/uL (ref 150–400)
RBC: 4.58 MIL/uL (ref 4.22–5.81)
RDW: 12.1 % (ref 11.5–15.5)
WBC: 5.4 10*3/uL (ref 4.0–10.5)
nRBC: 0 % (ref 0.0–0.2)

## 2023-05-01 NOTE — Patient Instructions (Addendum)
 VISIT SUMMARY:  During today's visit, we discussed your ongoing respiratory symptoms, history of atrial fibrillation, and past bladder cancer treatment. We reviewed your recent imaging studies and addressed your concerns about lung nodules and potential immune deficiency. We also discussed your current medications and overall health maintenance.  YOUR PLAN:  -MYCOBACTERIUM AVIUM COMPLEX (MAC) INFECTION: MAC is a type of bacterial infection that can cause lung disease, especially in people with weakened immune systems. Your imaging shows nodules in your lungs consistent with this infection, but since you do not have significant symptoms, we will avoid treatment for now due to potential medication side effects. We will monitor your condition with a chest CT scan in six months and check your immune profile with blood work. Please clean your shower heads with vinegar monthly and avoid exposure to mold and mildew.  -ATRIAL FIBRILLATION (AFIB): AFib is an irregular and often rapid heart rate that can increase your risk of strokes and other heart-related complications. You have had episodes of AFib, and we have switched you to carvedilol  to manage your heart rate. Continue taking carvedilol  and follow up with your cardiologist, Dr. Tobie, to ensure your heart condition remains stable.  -BLADDER CANCER (STATUS POST CYSTECTOMY): You had bladder cancer that was treated with surgery to remove your bladder and urethra. There is no current evidence of cancer recurrence. Continue your regular follow-ups with your urologist at [facility] to monitor your condition.  -GENERAL HEALTH MAINTENANCE: Routine health maintenance is important for your overall well-being. We will order your annual chest x-ray and echocardiogram to monitor your heart and lung health. Regular health check-ups are advised to keep track of your immune status and overall health.  INSTRUCTIONS:  Please follow up in six months for the  chest CT scan results. We will notify you of the immune profile blood work results within 7-10 days. Contact us  if you experience significant symptoms such as increased cough, sputum production, or other concerning symptoms.
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
## 2023-05-04 LAB — IMMUNOGLOBULINS A/E/G/M, SERUM
IgA: 237 mg/dL (ref 61–437)
IgE (Immunoglobulin E), Serum: 5 [IU]/mL — ABNORMAL LOW (ref 6–495)
IgG (Immunoglobin G), Serum: 900 mg/dL (ref 603–1613)
IgM (Immunoglobulin M), Srm: 56 mg/dL (ref 15–143)

## 2023-05-05 ENCOUNTER — Encounter: Payer: Self-pay | Admitting: Pulmonary Disease

## 2023-05-06 ENCOUNTER — Encounter: Payer: Self-pay | Admitting: Pulmonary Disease

## 2023-05-06 DIAGNOSIS — Z7189 Other specified counseling: Secondary | ICD-10-CM | POA: Diagnosis not present

## 2023-05-06 DIAGNOSIS — Z8619 Personal history of other infectious and parasitic diseases: Secondary | ICD-10-CM | POA: Diagnosis not present

## 2023-05-06 DIAGNOSIS — Z9889 Other specified postprocedural states: Secondary | ICD-10-CM | POA: Diagnosis not present

## 2023-05-06 DIAGNOSIS — Z8679 Personal history of other diseases of the circulatory system: Secondary | ICD-10-CM | POA: Diagnosis not present

## 2023-05-06 DIAGNOSIS — Z09 Encounter for follow-up examination after completed treatment for conditions other than malignant neoplasm: Secondary | ICD-10-CM | POA: Diagnosis not present
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
## 2023-05-07 ENCOUNTER — Ambulatory Visit: Payer: Medicare PPO | Admitting: Internal Medicine

## 2023-06-09 ENCOUNTER — Other Ambulatory Visit: Payer: Self-pay | Admitting: Internal Medicine

## 2023-06-09 MED ORDER — DEXILANT 60 MG PO CPDR: 1.0000 | DELAYED_RELEASE_CAPSULE | Freq: Every day | ORAL | 0 refills | Status: DC

## 2023-06-09 NOTE — Addendum Note (Signed)
Addended by: Sandy Salaam on: 06/09/2023 01:09 PM   Modules accepted: Orders

## 2023-06-09 NOTE — Telephone Encounter (Signed)
PA is needed for Dexilant.

## 2023-06-09 NOTE — Telephone Encounter (Signed)
Copied from CRM [id number]. Topic: Clinical - Medication Refill >> Jun 09, 2023 10:44 AM Lennart Pall wrote: Most Recent Primary Care Visit:  Provider: Sherlene Shams  Department: [facility]  Visit Type: OFFICE VISIT  Date: 04/25/2023  Medication: DEXILANT 60 MG capsule   Has the patient contacted their pharmacy? Yes (Agent: If no, request that the patient contact the pharmacy for the refill. If patient does not wish to contact the pharmacy document the reason why and proceed with request.) (Agent: If yes, when and what did the pharmacy advise?)  Is this the correct pharmacy for this prescription? Yes If no, delete pharmacy and type the correct one.  This is the patient's preferred pharmacy:   [facility] - [place], [place] - [address] [place] [address] Phone: [phone] Fax: [phone]       Has the prescription been filled recently? Yes  Is the patient out of the medication? Yes  Has the patient been seen for an appointment in the last year OR does the patient have an upcoming appointment? Yes  Can we respond through MyChart? Yes  Agent: Please be advised that Rx refills may take up to 3 business days. We ask that you follow-up with your pharmacy.

## 2023-06-09 NOTE — Telephone Encounter (Signed)
Copied from CRM [id number]. Topic: Clinical - Prescription Issue >> Jun 09, 2023 10:45 AM Lennart Pall wrote: Reason for CRM: Patient needing a pre authorization for DEXILANT 60 MG capsule

## 2023-06-09 NOTE — Telephone Encounter (Signed)
Copied from CRM [id number]. Topic: Clinical - Prescription Issue >> Jun 09, 2023 11:28 AM Irine Seal wrote: Reason for CRM:  DEXILANT 60 MG capsule [facility] for the prior authorization and it needs to be sent   [facility] - [place], [place] - [address] [place] [address] Phone: [phone]  Fax: [phone]

## 2023-06-10 ENCOUNTER — Other Ambulatory Visit: Payer: Self-pay | Admitting: Internal Medicine

## 2023-06-10 MED ORDER — OMEPRAZOLE 40 MG PO CPDR: 40.0000 mg | DELAYED_RELEASE_CAPSULE | Freq: Every day | ORAL | 3 refills | Status: DC

## 2023-06-10 NOTE — Telephone Encounter (Signed)
 PA for Dexilant was denied

## 2023-06-14 ENCOUNTER — Other Ambulatory Visit: Payer: Self-pay | Admitting: Internal Medicine

## 2023-06-30 DIAGNOSIS — Z936 Other artificial openings of urinary tract status: Secondary | ICD-10-CM | POA: Diagnosis not present

## 2023-06-30 DIAGNOSIS — C7911 Secondary malignant neoplasm of bladder: Secondary | ICD-10-CM | POA: Diagnosis not present

## 2023-08-04 DIAGNOSIS — M545 Low back pain, unspecified: Secondary | ICD-10-CM | POA: Diagnosis not present

## 2023-08-04 DIAGNOSIS — M533 Sacrococcygeal disorders, not elsewhere classified: Secondary | ICD-10-CM | POA: Diagnosis not present

## 2023-08-15 ENCOUNTER — Other Ambulatory Visit (HOSPITAL_COMMUNITY): Payer: Self-pay

## 2023-08-15 ENCOUNTER — Telehealth: Payer: Self-pay

## 2023-08-15 NOTE — Telephone Encounter (Signed)
 Pharmacy Patient Advocate Encounter   Received notification from CoverMyMeds that prior authorization for Dexilant  60MG  dr capsules is required/requested.   Insurance verification completed.   The patient is insured through [employer] .   Per test claim: The current 30 day co-pay is, $64.00.  No PA needed at this time. This test claim was processed through [facility]- copay amounts may vary at other pharmacies due to pharmacy/plan contracts, or as the patient moves through the different stages of their insurance plan.

## 2023-09-08 ENCOUNTER — Other Ambulatory Visit (HOSPITAL_COMMUNITY): Payer: Self-pay

## 2023-09-08 ENCOUNTER — Telehealth: Payer: Self-pay

## 2023-09-08 ENCOUNTER — Encounter: Payer: Self-pay | Admitting: Internal Medicine

## 2023-09-08 ENCOUNTER — Other Ambulatory Visit: Payer: Self-pay | Admitting: Internal Medicine

## 2023-09-08 DIAGNOSIS — A310 Pulmonary mycobacterial infection: Secondary | ICD-10-CM | POA: Insufficient documentation

## 2023-09-08 MED ORDER — ATROVENT HFA 17 MCG/ACT IN AERS: 2.0000 | INHALATION_SPRAY | Freq: Four times a day (QID) | RESPIRATORY_TRACT | 12 refills | Status: DC | PRN

## 2023-09-08 NOTE — Telephone Encounter (Signed)
 Copied from CRM [id number]. Topic: Clinical - Prescription Issue >> Sep 08, 2023 11:24 AM Chuck Crater wrote: Reason for CRM: Patient wants to know if PA team has received Dr. Berl Breed request for Prior Authorization on Atrovent  inhaler. He wants to know how long  the process takes because pharmacy still doesn't have it. Please contact patient.

## 2023-09-08 NOTE — Telephone Encounter (Signed)
 PA needed fro Atrovent  inhaler

## 2023-09-09 ENCOUNTER — Other Ambulatory Visit (HOSPITAL_COMMUNITY): Payer: Self-pay

## 2023-09-09 ENCOUNTER — Other Ambulatory Visit: Payer: Self-pay

## 2023-09-09 ENCOUNTER — Other Ambulatory Visit: Payer: Self-pay | Admitting: Internal Medicine

## 2023-09-09 DIAGNOSIS — A310 Pulmonary mycobacterial infection: Secondary | ICD-10-CM

## 2023-09-09 DIAGNOSIS — A150 Tuberculosis of lung: Secondary | ICD-10-CM | POA: Insufficient documentation

## 2023-09-09 MED ORDER — ATROVENT HFA 17 MCG/ACT IN AERS
2.0000 | INHALATION_SPRAY | Freq: Four times a day (QID) | RESPIRATORY_TRACT | 12 refills | Status: DC | PRN
Start: 2023-09-09 — End: 2023-09-09

## 2023-09-09 MED ORDER — ATROVENT HFA 17 MCG/ACT IN AERS: 2.0000 | INHALATION_SPRAY | Freq: Four times a day (QID) | RESPIRATORY_TRACT | 12 refills | Status: DC | PRN

## 2023-09-10 ENCOUNTER — Other Ambulatory Visit (HOSPITAL_COMMUNITY): Payer: Self-pay

## 2023-09-10 ENCOUNTER — Telehealth: Payer: Self-pay | Admitting: Pharmacy Technician

## 2023-09-10 MED ORDER — SPIRIVA RESPIMAT 2.5 MCG/ACT IN AERS: 2.0000 | INHALATION_SPRAY | Freq: Every day | RESPIRATORY_TRACT | 0 refills | Status: AC

## 2023-09-10 NOTE — Telephone Encounter (Signed)
 PA request has been Started. New Encounter has been or will be created for follow up. For additional info see Pharmacy Prior Auth telephone encounter from 09/10/23.

## 2023-09-10 NOTE — Telephone Encounter (Signed)
 Pharmacy Patient Advocate Encounter   Received notification from Onbase that prior authorization for Atrovent  HFA 17MCG/ACT aerosol is required/requested.   Insurance verification completed.   The patient is insured through [employer] .   Per test claim:  SPIRIVA is preferred by the insurance.  If suggested medication is appropriate, Please send in a new RX and discontinue this one. If not, please advise as to why it's not appropriate so that we may request a Prior Authorization. Please note, some preferred medications may still require a PA.  If the suggested medications have not been trialed and there are no contraindications to their use, the PA will not be submitted, as it will not be approved.

## 2023-09-16 ENCOUNTER — Encounter: Payer: Self-pay | Admitting: Urology

## 2023-09-16 ENCOUNTER — Ambulatory Visit: Admitting: Urology

## 2023-09-16 ENCOUNTER — Ambulatory Visit: Admitting: Dermatology

## 2023-09-16 ENCOUNTER — Encounter: Payer: Self-pay | Admitting: Dermatology

## 2023-09-16 VITALS — BP 142/84 | Ht 70.0 in | Wt 148.0 lb

## 2023-09-16 DIAGNOSIS — N498 Inflammatory disorders of other specified male genital organs: Secondary | ICD-10-CM | POA: Diagnosis not present

## 2023-09-16 DIAGNOSIS — R399 Unspecified symptoms and signs involving the genitourinary system: Secondary | ICD-10-CM | POA: Diagnosis not present

## 2023-09-16 DIAGNOSIS — R102 Pelvic and perineal pain: Secondary | ICD-10-CM | POA: Diagnosis not present

## 2023-09-16 DIAGNOSIS — B001 Herpesviral vesicular dermatitis: Secondary | ICD-10-CM

## 2023-09-16 DIAGNOSIS — B3742 Candidal balanitis: Secondary | ICD-10-CM

## 2023-09-16 DIAGNOSIS — Z8619 Personal history of other infectious and parasitic diseases: Secondary | ICD-10-CM

## 2023-09-16 DIAGNOSIS — R21 Rash and other nonspecific skin eruption: Secondary | ICD-10-CM

## 2023-09-16 DIAGNOSIS — Z7189 Other specified counseling: Secondary | ICD-10-CM

## 2023-09-16 DIAGNOSIS — Z79899 Other long term (current) drug therapy: Secondary | ICD-10-CM

## 2023-09-16 LAB — URINALYSIS, COMPLETE
Bilirubin, UA: NEGATIVE
Glucose, UA: NEGATIVE
Ketones, UA: NEGATIVE
Leukocytes,UA: NEGATIVE
Nitrite, UA: NEGATIVE
Protein,UA: NEGATIVE
RBC, UA: NEGATIVE
Specific Gravity, UA: <1.005 — ABNORMAL LOW (ref 1.005–1.030)
Urobilinogen, Ur: 0.2 mg/dL (ref 0.2–1.0)
pH, UA: 6 (ref 5.0–7.5)

## 2023-09-16 LAB — MICROSCOPIC EXAMINATION: RBC, Urine: NONE SEEN /HPF (ref 0–2)

## 2023-09-16 MED ORDER — VALACYCLOVIR HCL 1 G PO TABS: 1000.0000 mg | ORAL_TABLET | Freq: Two times a day (BID) | ORAL | 6 refills | Status: AC

## 2023-09-16 MED ORDER — FLUCONAZOLE 200 MG PO TABS: 200.0000 mg | ORAL_TABLET | ORAL | 6 refills | Status: DC

## 2023-09-16 MED ORDER — VALACYCLOVIR HCL 1 G PO TABS: 1000.0000 mg | ORAL_TABLET | Freq: Two times a day (BID) | ORAL | 6 refills | Status: DC

## 2023-09-16 MED ORDER — CIPROFLOXACIN HCL 500 MG PO TABS
500.0000 mg | ORAL_TABLET | Freq: Two times a day (BID) | ORAL | 0 refills | Status: AC
Start: 2023-09-16 — End: 2023-09-23

## 2023-09-16 MED ORDER — PREDNISONE 10 MG PO TABS: 10.0000 mg | ORAL_TABLET | Freq: Every day | ORAL | 6 refills | Status: DC

## 2023-09-16 NOTE — Progress Notes (Signed)
 09/16/2023 3:05 PM   Tony Cox 11/13/1950 [id number]  Referring provider: Thersia Flax, MD [address] [place],  [place] [address]  Urological history: 1. 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 Lynnwood Sauer the on 01/28/2013 for muscle invasive bladder cancer status post neoadjuvant it chemotherapy - Patient underwent urethrectomy with Dr. Lita Rieger on 11/05/2016.  Pathology was benign. - Currently being followed and managed by Dr Caridad Chard at [facility] in [place]   2. Prostate cancer - pT2cNO adenocarcinoma of the prostate - Currently being followed and managed by Dr Caridad Chard at [facility] in [place]    Chief Complaint  Patient presents with   possible uti   HPI: Tony Cox is a 73 y.o. male who presents today for possible UTI.   Previous records reviewed.   Over the weekend he started to experience pain at the tip of his penis and a burning sensation through the urethra, or where the urethra used to be.  He had Cipro  on hand so he started that medication over the weekend.  He states his symptoms have somewhat abated.  He was seen by dermatology just prior to seeing me and was diagnosed with balanitis and put on oral Diflucan  and prednisone .  Patient denies any modifying or aggravating factors.  Patient denies any recent UTI's, gross hematuria or suprapubic/flank pain.  Patient denies any fevers, chills, nausea or vomiting.    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)  06/14/2015   History of atrial fibrillation without current medication    POST OP MITRIAL VALVE REPLACEMENT IN 2004   History of mitral valve repair CARDIOLOGIST-- DR FATH--  LOV NOTE W/ CHART 10-30-2011   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   Hyperlipidemia    Hypertension    Neuromuscular disorder [facility])     Surgical History: Past Surgical History:  Procedure Laterality Date   CARDIAC CATHETERIZATION     CATARACT EXTRACTION W/PHACO Left 11/20/2015   Procedure: CATARACT EXTRACTION PHACO AND INTRAOCULAR LENS PLACEMENT (IOC);  Surgeon: Clair Crews, MD;  Location: [facility] ORS;  Service: Ophthalmology;  Laterality: Left;  US  AP% CDE fluid pack lot # [id number]   CHOLECYSTECTOMY  2011   COLONOSCOPY  2009, 2013   Dr Altha Jester ADENOMA    COLONOSCOPY WITH PROPOFOL  N/A 07/24/2016   Procedure: COLONOSCOPY WITH PROPOFOL ;  Surgeon: Kisean Skeeter, MD;  [facility];  Service: Endoscopy;  Laterality: N/A;   COLONOSCOPY WITH PROPOFOL  N/A 07/04/2021   Procedure: COLONOSCOPY WITH PROPOFOL ;  Surgeon: Alok Skeeter, MD;  Location: [facility] ENDOSCOPY;  Service: Endoscopy;  Laterality: N/A;  1ST CASE PER OFFICE   cystectomy  2014   ileal conduit diversion    HERNIA REPAIR  2011   umbilical    MITRAL VALVE REPLACEMENT     TONSILLECTOMY  AS CHILD   TRANSTHORACIC ECHOCARDIOGRAM  11-03-2008  DR FATH [place])   LVF NORMAL/ EF 55-60%/ LEFT ATRIAL ENLARGEMENT/  ADEQUATELY FUNCTIONING MITRAL  VALVE REPAIR/  MILD MR & TRI/  NO SIGNIFICANT CHANGE FROM PREVIOUS ECHO   TRANSURETHRAL RESECTION OF BLADDER TUMOR N/A 07/27/2012   Procedure: TRANSURETHRAL RESECTION OF BLADDER TUMOR (TURBT);  Surgeon: Trent Frizzle, MD;  Location: [facility];  Service: Urology;  Laterality: N/A;  WITH MITOMYCIN  INSTILLATION    urethroptomy  11/05/2016   [place]    valvuloplasty with replacement of cosgrove ring,1 congenital ruptur of cordi  2004   [facility]   mitral valve     Home Medications:  Allergies as of 09/16/2023       Reactions   Codeine Nausea And Vomiting, Hives        Medication List        Accurate as of Sep 16, 2023  3:05 PM. If you have any questions, ask your nurse or doctor.          Atrovent  HFA 17 MCG/ACT inhaler Generic drug: ipratropium Inhale 2 puffs into the lungs every 6 (six) hours as needed for wheezing.   benzonatate  100 MG capsule Commonly known as: Tessalon  Perles Take 1 capsule (100 mg total) by mouth 3 (three) times daily as needed for cough.   carvedilol  20 MG 24 hr capsule Commonly known as: COREG  CR Take 1 capsule (20 mg total) by mouth daily.   ciprofloxacin  500 MG tablet Commonly known as: Cipro  Take 1 tablet (500 mg total) by mouth 2 (two) times daily for 7 days. Started by: Analissa Bayless   clobetasol  cream 0.05 % Commonly known as: TEMOVATE  Apply 1 Application topically 2 (two) times daily. Apply to poison ivy on arm bid until clear, avoid face, groin, axilla   dicyclomine  10 MG capsule Commonly known as: BENTYL  TAKE 1 CAPSULE TWICE DAILY   fluconazole  200 MG tablet Commonly known as: DIFLUCAN  Take 1 tablet (200 mg total) by mouth as directed. Take 1 pill 3 times a week for 1 week per episode for yeast outbreak Started by: Celine Collard   gabapentin  300 MG capsule Commonly known as: NEURONTIN  TAKE 1 CAPSULE TWICE DAILY   metroNIDAZOLE  1 % gel Commonly known as: METROGEL  Apply topically daily. Apply to face qhs for Rosacea   mirtazapine  15 MG disintegrating tablet Commonly known as: REMERON  SOL-TAB DISSOLVE 1 TABLET ON THE TONGUE AT BEDTIME   mometasone  0.1 % cream Commonly known as: ELOCON  Apply 1 Application topically daily as needed (Rash). qd up to 5 days a week to itchy rash on body until clear, then prn flares, avoid face, groin, axilla   nystatin  powder Commonly known as: MYCOSTATIN /NYSTOP  Apply 1 Application topically 3 (three) times daily.    omeprazole  40 MG capsule Commonly known as: PRILOSEC Take 1 capsule (40 mg total) by mouth daily.   predniSONE  10 MG tablet Commonly known as: DELTASONE  Take 1 tablet (10 mg total) by mouth daily with breakfast. Take 3 pills a day for 3 days, then 2 pills a day for 3 days, then 1 pill a day for 3 days then 1 po every other day for 3 doses, for inflammation in groin Started by: Celine Collard   PROBIOTIC & ACIDOPHILUS EX ST PO Take 2 tablets by mouth at bedtime as needed.   Spiriva Respimat 2.5 MCG/ACT Aers Generic drug: Tiotropium Bromide Monohydrate Inhale 2 puffs into the lungs daily.   traMADol  50 MG tablet Commonly known as: ULTRAM  Take 1 tablet (50 mg total) by mouth every 6 (six) hours as needed.   Unisom SleepTabs 25 MG tablet Generic drug:  doxylamine (Sleep) Take by mouth.   valACYclovir  1000 MG tablet Commonly known as: VALTREX  Take 1 tablet (1,000 mg total) by mouth 2 (two) times daily. 1 po bid for 10 days per fever blister outbreak What changed: additional instructions Changed by: Celine Collard        Allergies:  Allergies  Allergen Reactions   Codeine Nausea And Vomiting and Hives    Family History: Family History  Adopted: Yes  Problem Relation Age of Onset   Colon cancer Mother    Cancer Neg Hx        Prostate,Kidney,Bladder   Prostate cancer Neg Hx    Bladder Cancer Neg Hx    Kidney cancer Neg Hx     Social History:  reports that he quit smoking about 11 years ago. His smoking use included cigarettes. He has never used smokeless tobacco. He reports current alcohol use of about 1.0 standard drink of alcohol per week. He reports that he does not use drugs.  ROS: Pertinent ROS in HPI  Physical Exam: BP (!) 142/84   Ht 5\' 10"  (1.778 m)   Wt 148 lb (67.1 kg)   BMI 21.24 kg/m   Constitutional:  Well nourished. Alert and oriented, No acute distress. HEENT: [place] AT, moist mucus membranes.  Trachea midline Cardiovascular: No clubbing, cyanosis,  or edema. Respiratory: Normal respiratory effort, no increased work of breathing. GU: Stoma is pink and stoma bag is clean and urine is grossly clear .  Patient with uncircumcised phallus.   Foreskin easily retracted  Urethral meatus is patent.  No penile discharge. No penile lesions or rashes. Scrotum without lesions, cysts, rashes and/or edema.  Testicles are located scrotally bilaterally. No masses are appreciated in the testicles. Left and right epididymis are normal. Neurologic: Grossly intact, no focal deficits, moving all 4 extremities. Psychiatric: Normal mood and affect.   Laboratory Data: Results for orders placed or performed during the hospital encounter of 05/01/23  Immunoglobulins, QN, A/E/G/M   Collection Time: 05/01/23  3:25 PM  Result Value Ref Range   IgG (Immunoglobin G), Serum 900 603 - 1,613 mg/dL   IgA 784 61 - 696 mg/dL   IgM (Immunoglobulin M), Srm 56 15 - 143 mg/dL   IgE (Immunoglobulin E), Serum 5 (L) 6 - 495 IU/mL  CBC with Differential/Platelet   Collection Time: 05/01/23  3:25 PM  Result Value Ref Range   WBC 5.4 4.0 - 10.5 K/uL   RBC 4.58 4.22 - 5.81 MIL/uL   Hemoglobin 14.0 13.0 - 17.0 g/dL   HCT 29.5 28.4 - 13.2 %   MCV 90.6 80.0 - 100.0 fL   MCH 30.6 26.0 - 34.0 pg   MCHC 33.7 30.0 - 36.0 g/dL   RDW 44.0 10.2 - 72.5 %   Platelets 198 150 - 400 K/uL   nRBC 0.0 0.0 - 0.2 %   Neutrophils Relative % 52 %   Neutro Abs 2.7 1.7 - 7.7 K/uL   Lymphocytes Relative 32 %   Lymphs Abs 1.7 0.7 - 4.0 K/uL   Monocytes Relative 11 %   Monocytes Absolute 0.6 0.1 - 1.0 K/uL   Eosinophils Relative 4 %   Eosinophils Absolute 0.2 0.0 - 0.5 K/uL   Basophils Relative 1 %   Basophils Absolute 0.1 0.0 - 0.1 K/uL   Immature Granulocytes 0 %   Abs Immature Granulocytes 0.02 0.00 - 0.07 K/uL   Urinalysis  See HPI and EPIC I have reviewed the labs.   Pertinent Imaging: N/A  Assessment & Plan:    1. Dysuria - Likely fungal in nature, he will continue the Diflucan   and prednisone  as prescribed by his dermatologist - UA is unremarkable, but that may be because of the Cipro  taken over the weekend - Urine culture sent in abundance of caution - Refill for Cipro  sent to pharmacy  Return if symptoms worsen or fail to improve.  These notes generated with voice recognition software. I apologize for typographical errors.  Briant Camper  [facility] [address]  [address] [place], [place] [address] [phone]

## 2023-09-16 NOTE — Patient Instructions (Addendum)
 Per episode for groin Continue Diflucan  200mg  1 pill 3 times a week for 1 week per episode Continue Valtrex  1 gram twice a day for 10 days per episode Start Prednisone  10mg , 3 pills a day for 3 days, then decrease to 2 pills a day for 3 days, then decrease to 1 pill  a day for 3 days then 1 pill every other day until you finish prescription.        Due to recent changes in healthcare laws, you may see results of your pathology and/or laboratory studies on MyChart before the doctors have had a chance to review them. We understand that in some cases there may be results that are confusing or concerning to you. Please understand that not all results are received at the same time and often the doctors may need to interpret multiple results in order to provide you with the best plan of care or course of treatment. Therefore, we ask that you please give us  2 business days to thoroughly review all your results before contacting the office for clarification. Should we see a critical lab result, you will be contacted sooner.   If You Need Anything After Your Visit  If you have any questions or concerns for your doctor, please call our main line at [phone] and press option 4 to reach your doctor's medical assistant. If no one answers, please leave a voicemail as directed and we will return your call as soon as possible. Messages left after 4 pm will be answered the following business day.   You may also send us  a message via MyChart. We typically respond to MyChart messages within 1-2 business days.  For prescription refills, please ask your pharmacy to contact our office. Our fax number is [phone].  If you have an urgent issue when the clinic is closed that cannot wait until the next business day, you can page your doctor at the number below.    Please note that while we do our best to be available for urgent issues outside of office hours, we are not available 24/7.   If you have an  urgent issue and are unable to reach us , you may choose to seek medical care at your doctor's office, retail clinic, urgent care center, or emergency room.  If you have a medical emergency, please immediately call 911 or go to the emergency department.  Pager Numbers  - Dr. Bary Likes: [phone]  - Dr. Annette Barters: [phone]  - Dr. Felipe Horton: [phone]   In the event of inclement weather, please call our main line at [phone] for an update on the status of any delays or closures.  Dermatology Medication Tips: Please keep the boxes that topical medications come in in order to help keep track of the instructions about where and how to use these. Pharmacies typically print the medication instructions only on the boxes and not directly on the medication tubes.   If your medication is too expensive, please contact our office at [phone] option 4 or send us  a message through MyChart.   We are unable to tell what your co-pay for medications will be in advance as this is different depending on your insurance coverage. However, we may be able to find a substitute medication at lower cost or fill out paperwork to get insurance to cover a needed medication.   If a prior authorization is required to get your medication covered by your insurance company, please allow us  1-2 business days to complete this process.  Drug prices often vary depending on where the prescription is filled and some pharmacies may offer cheaper prices.  The website www.goodrx.com contains coupons for medications through different pharmacies. The prices here do not account for what the cost may be with help from insurance (it may be cheaper with your insurance), but the website can give you the price if you did not use any insurance.  - You can print the associated coupon and take it with your prescription to the pharmacy.  - You may also stop by our office during regular business hours and pick up a GoodRx coupon card.   - If you need your prescription sent electronically to a different pharmacy, notify our office through [facility] or by phone at [phone] option 4.     Si Usted Necesita Algo Despus de Su Visita  Tambin puede enviarnos un mensaje a travs de Clinical cytogeneticist. Por lo general respondemos a los mensajes de MyChart en el transcurso de 1 a 2 das hbiles.  Para renovar recetas, por favor pida a su farmacia que se ponga en contacto con nuestra oficina. Franz Jacks de fax es [place] [phone].  Si tiene un asunto urgente cuando la clnica est cerrada y que no puede esperar hasta el siguiente da hbil, puede llamar/localizar a su doctor(a) al nmero que aparece a continuacin.   Por favor, tenga en cuenta que aunque hacemos todo lo posible para estar disponibles para asuntos urgentes fuera del horario de [place], no estamos disponibles las 24 horas del da, los 7 [address]  [address] de la [place].   Si tiene un problema urgente y no puede comunicarse con nosotros, puede optar por buscar atencin mdica  en el consultorio de su doctor(a), en una clnica privada, en un centro de atencin urgente o en una sala de emergencias.  Si tiene Engineer, drilling, por favor llame inmediatamente al 911 o vaya a la sala de emergencias.  Nmeros de bper  - Dr. Bary Likes: [phone]  - Dra. Annette Barters: [phone]  - Dr. Felipe Horton: [phone]   En caso de inclemencias del tiempo, por favor llame a Lajuan Pila principal al [phone] para una actualizacin sobre el [place] de cualquier retraso o cierre.  Consejos para la medicacin en dermatologa: Por favor, guarde las cajas en las que vienen los medicamentos de uso tpico para ayudarle a seguir las instrucciones sobre dnde y cmo usarlos. Las farmacias generalmente imprimen las instrucciones del medicamento slo en las cajas y no directamente en los tubos del [place].   Si su medicamento es muy caro, por favor, pngase en contacto con Bettyjane Brunet  llamando al [phone] y presione la opcin 4 o envenos un mensaje a travs de Clinical cytogeneticist.   No podemos decirle cul ser su copago por los medicamentos por adelantado ya que esto es diferente dependiendo de la cobertura de su seguro. Sin embargo, es posible que podamos encontrar un medicamento sustituto a Audiological scientist un formulario para que el seguro cubra el medicamento que se considera necesario.   Si se requiere una autorizacin previa para que su compaa de seguros [place] su medicamento, por favor permtanos de 1 a 2 das hbiles para completar este proceso.  Los precios de los medicamentos varan con frecuencia dependiendo del Environmental consultant de dnde se surte la receta y alguna farmacias pueden ofrecer precios ms baratos.  El sitio web www.goodrx.com tiene cupones para medicamentos de Health and safety inspector. Los precios aqu no tienen en cuenta lo que podra costar con la ayuda del seguro (puede ser  ms barato con su seguro), pero el sitio web puede darle el precio si no Visual merchandiser.  - Puede imprimir el cupn correspondiente y llevarlo con su receta a la farmacia.  - Tambin puede pasar por nuestra oficina durante el horario de atencin regular y Education officer, museum una tarjeta de cupones de GoodRx.  - Si necesita que su receta se enve electrnicamente a una farmacia diferente, informe a nuestra oficina a travs de MyChart de [place] o por telfono llamando al [phone] y presione la opcin 4.

## 2023-09-16 NOTE — Progress Notes (Signed)
   Follow-Up Visit   Subjective  Tony Cox is a 73 y.o. male who presents for the following: possible yeast infection, painful, red irritated ~5 days, has taken Diflucan  200mg  x 2 days, Cipro  500mg  1 po bid  for a few days, and took Valacyclovir  The patient has spots, moles and lesions to be evaluated, some may be new or changing and the patient may have concern these could be cancer.  The following portions of the chart were reviewed this encounter and updated as appropriate: medications, allergies, medical history  Review of Systems:  No other skin or systemic complaints except as noted in HPI or Assessment and Plan.  Objective  Well appearing patient in no apparent distress; mood and affect are within normal limits.  A focused examination was performed of the following areas: groin  Relevant exam findings are noted in the Assessment and Plan.   Assessment & Plan   Symptomatic Inflammation from Multiple inflammatory conditions of genital skin Currently flared Exam: erythema of glans penis and distal penile dorsum shaft Treatment: Start Prednisone  10mg  3 po qam x 3 days, then decrease to 2 po qam x 3 days, then decrease to1 po qam x 3 days then decrease to 1 po every other day x 3 doses, take with breakfast    CANDIDA / BALANITIS Penis  Exam: glans penis and proximal glans penis mild erythema Treatment Plan: Cont Diflucan  200mg  1 po 3 times a week for 1 week per episode Cont Valacyclovir  1g bid for 10 days  Start Prednisone  10mg  3 po qam x 3 days, then decrease to 2 po qam x 3 days, then decrease to1 po qam x 3 days then decrease to 1 po every other day x 3 doses, take with breakfast   Side effects of fluconazole  (diflucan ) include nausea, diarrhea, headache, dizziness, taste changes, rare risk of irritation of the liver, allergy, or decreased blood counts (which could show up as infection or tiredness).  Risks of prednisone  taper include mood irritability, insomnia,  weight gain, stomach ulcers, increased risk of infection, increased blood sugar (diabetes), hypertension, osteoporosis with long-term or frequent use, and rare risk of avascular necrosis of the hip.    HERPESVIRAL INFECTION (COLD SORES) groin Exam glans penis and proximal glans penis mild erythema Chronic and persistent condition with duration or expected duration over one year. Condition is symptomatic / bothersome to patient. Not to goal.  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  It is infectious and can be spread from person to person by direct contact.  It is not curable, but is treatable with topical and oral medication. Treatment Plan Cont Valtrex  1g bid for 10 days prn outbreaks   SYMPTOMS of UTI Exam:  none Treatment Plan: Cont Cipro  as prescribed by Urologist Patient has an appt with Urology today  Return in about 5 months (around 02/16/2024) for Candida/Balanitis f/u.  I, Rollie Clipper, RMA, am acting as scribe for Celine Collard, MD .   Documentation: I have reviewed the above documentation for accuracy and completeness, and I agree with the above.  Celine Collard, MD

## 2023-09-18 LAB — CULTURE, URINE COMPREHENSIVE

## 2023-09-19 ENCOUNTER — Ambulatory Visit: Payer: Self-pay | Admitting: Urology

## 2023-09-23 ENCOUNTER — Other Ambulatory Visit: Payer: Self-pay | Admitting: Internal Medicine

## 2023-09-26 ENCOUNTER — Telehealth: Payer: Self-pay | Admitting: Urology

## 2023-09-26 ENCOUNTER — Ambulatory Visit
Admission: RE | Admit: 2023-09-26 | Discharge: 2023-09-26 | Disposition: A | Discharge status: Home / Self Care | Source: Ambulatory Visit | Attending: Urology | Admitting: Urology

## 2023-09-26 ENCOUNTER — Ambulatory Visit: Payer: Self-pay | Admitting: Pulmonary Disease

## 2023-09-26 ENCOUNTER — Other Ambulatory Visit: Payer: Self-pay | Admitting: Urology

## 2023-09-26 DIAGNOSIS — R109 Unspecified abdominal pain: Secondary | ICD-10-CM | POA: Diagnosis not present

## 2023-09-26 DIAGNOSIS — Z8551 Personal history of malignant neoplasm of bladder: Secondary | ICD-10-CM | POA: Diagnosis not present

## 2023-09-26 DIAGNOSIS — C689 Malignant neoplasm of urinary organ, unspecified: Secondary | ICD-10-CM

## 2023-09-26 DIAGNOSIS — R102 Pelvic and perineal pain: Secondary | ICD-10-CM

## 2023-09-26 DIAGNOSIS — K760 Fatty (change of) liver, not elsewhere classified: Secondary | ICD-10-CM | POA: Diagnosis not present

## 2023-09-26 MED ORDER — IOHEXOL 300 MG/ML  SOLN
100.0000 mL | Freq: Once | INTRAMUSCULAR | Status: AC | PRN
  Administered 2023-09-26: 100 mL via INTRAVENOUS

## 2023-09-26 NOTE — Telephone Encounter (Signed)
 Spoke with the patient regarding his CT results and have messaged with Dr. Josephine Nicolas regarding the lung findings.  It is recommended that he visit an urgent care for further evaluation and treatment.

## 2023-09-26 NOTE — Telephone Encounter (Signed)
 FYI Only or Action Required?: FYI only for provider  Patient is followed in Pulmonology for hx pneumonia and MAC, last seen on 05/01/2023 by Marc Senior, MD. Called Nurse Triage reporting Shortness of Breath and Pneumonia. Symptoms began several days ago. Interventions attempted: Other: CT scan today with urologist Dr. Ledon Pry and follow up messaging with relayed feedback from Dr. Viva Grise. Symptoms are: rapidly worsening.  Triage Disposition: See HCP Within 4 Hours (Or PCP Triage)  Patient/caregiver understands and will follow disposition?: No, refuses disposition - Refusing UC as Dr. Viva Grise recommended, requesting call back         Copied from CRM [phone]. Topic: Clinical - Red Word Triage >> Sep 26, 2023  3:50 PM Margarette Shawl wrote: Red Word that prompted transfer to Nurse Triage:   Pt had CT scan performed today due to shortness of breath for 2 weeks CT was ordered by urologist Found to have pneumonia, diagnosed from results. Will need to start antibiotics History of MAC  Urologist messaged Dr. Viva Grise to let her know   Reason for Disposition  [1] MILD difficulty breathing (e.g., minimal/no SOB at rest, SOB with walking, pulse <100) AND [2] NEW-onset or WORSE than normal  Answer Assessment - Initial Assessment Questions E2C2 Pulmonary Triage - Initial Assessment Questions "Chief Complaint (e.g., cough, sob, wheezing, fever, chills, sweat or additional symptoms) *Go to specific symptom protocol after initial questions. Been having trouble, thought was having some urinary tract issues, hx bladder cancer, thought swelling in abdomen and lot of pressure, cancer doctors wanted CT, just not felt well, other day, couldn't breathe real good, CT showed pneumonia Hx of MAC Dr. Viva Grise found on CT last year [place] is trying to get me to somebody who can give me antibx SOB at rest some too Last couple days noticed something wrong Abdomen was clear No chest pain, dizziness Last  time had pneumonia had virus as well and got a-fib Dr. Ledon Pry with urology tried calling pt during pt's call with nurse, pt states he will stay on phone with nurse and call urinary back, Dr. Ledon Pry messaged pt saying that Dr. Viva Grise got back with her, Dr. Viva Grise says is not pneumonia, hx MAC, may have bronchitis on top of things, this is from Dr. Viva Grise, she doesn't know if you should be on antibx without exam, Dr. Viva Grise advises UC tonight Don't want to go to UC Heart goes up a little bit, usually resting 64-65 bpm BP will go up to 75-80 bpm not usually what happens No wheezing I'm a patient in this office They are my doctors, not in office on Friday, you just leave patients hanging on Friday Pt requesting nurse call on-call doc, advised that on-call doc would likely defer to Dr. Viva Grise for next steps as she knows pt's case, advised UC Urgent care doesn't know my case I'm very frustrated Going to talk to Dr. Auston Left when I see him at school  "How long have symptoms been present?" Couple of days  6. CARDIAC HISTORY: "Do you have any history of heart disease?" (e.g., heart attack, angina, bypass surgery, angioplasty)      HTN  7. LUNG HISTORY: "Do you have any history of lung disease?"  (e.g., pulmonary embolus, asthma, emphysema)     Hx pneumonia and MAC  CT hematuria workup from today at 1500 in pt chart imaging  Pt refusing UC though Dr. Viva Grise recommended UC herself which was relayed by Dr. Ledon Pry to pt through messaging. Advised UC or ED if  worsening, sending message for pulm to follow up on Monday  Protocols used: Breathing Difficulty-A-AH

## 2023-10-01 ENCOUNTER — Encounter: Payer: Self-pay | Admitting: Internal Medicine

## 2023-10-01 ENCOUNTER — Ambulatory Visit: Admitting: Internal Medicine

## 2023-10-01 VITALS — BP 110/60 | HR 58 | Temp 97.7°F | Ht 67.0 in | Wt 154.4 lb

## 2023-10-01 DIAGNOSIS — J4520 Mild intermittent asthma, uncomplicated: Secondary | ICD-10-CM

## 2023-10-01 DIAGNOSIS — R918 Other nonspecific abnormal finding of lung field: Secondary | ICD-10-CM

## 2023-10-01 NOTE — Patient Instructions (Addendum)
 Recommend CT chest in 2 weeks Patient need ATROVENT  INHALER FOR MILD INTERMITTENT REACTIVE AIRWAYS DISEASE PATIENT CAN NOT TOLERATE ALBUTEROL  INHALERS DUE TO UNDERLYING HEART ISSUES  Avoid Allergens and Irritants Avoid secondhand smoke Avoid SICK contacts Recommend  Masking  when appropriate Recommend Keep up-to-date with vaccinations  Follow up With Cardiology Follow up with Oncology
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
## 2023-10-06 DIAGNOSIS — Z08 Encounter for follow-up examination after completed treatment for malignant neoplasm: Secondary | ICD-10-CM | POA: Diagnosis not present

## 2023-10-06 DIAGNOSIS — Z8551 Personal history of malignant neoplasm of bladder: Secondary | ICD-10-CM | POA: Diagnosis not present

## 2023-10-06 DIAGNOSIS — C679 Malignant neoplasm of bladder, unspecified: Secondary | ICD-10-CM | POA: Diagnosis not present

## 2023-10-06 DIAGNOSIS — C61 Malignant neoplasm of prostate: Secondary | ICD-10-CM | POA: Diagnosis not present

## 2023-10-07 DIAGNOSIS — C679 Malignant neoplasm of bladder, unspecified: Secondary | ICD-10-CM | POA: Diagnosis not present

## 2023-10-15 ENCOUNTER — Other Ambulatory Visit: Payer: Self-pay | Admitting: Internal Medicine

## 2023-10-15 DIAGNOSIS — K5732 Diverticulitis of large intestine without perforation or abscess without bleeding: Secondary | ICD-10-CM | POA: Insufficient documentation

## 2023-10-15 MED ORDER — AMOXICILLIN-POT CLAVULANATE 875-125 MG PO TABS: 1.0000 | ORAL_TABLET | Freq: Two times a day (BID) | ORAL | 0 refills | Status: DC

## 2023-10-15 NOTE — Assessment & Plan Note (Signed)
 Noted on CT done June 2025 at [facility] during annual surveillance for bladder CA.  Primary sympotm is bloating. Empiric abx prescibed with augmentin 

## 2023-11-04 NOTE — Progress Notes (Signed)
 11/05/2023 12:27 PM   Tony Cox 02-17-1951 [id number]  Referring provider: Marylynn Verneita CROME, MD [address] [place],  [place] [address]  Urological history: 1. 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 Anselm Ruffini the on 01/28/2013 for muscle invasive bladder cancer status post neoadjuvant it chemotherapy - Patient underwent urethrectomy with Dr. Lovely on 11/05/2016.  Pathology was benign. - Currently being followed and managed by Dr Sherwood Lovely at [facility] in [place]   2. 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 man who presents today for abnormal blood work.    Previous records reviewed.   He had recently been seen at [facility] in [place]  and had a CT of the abdomen and pelvis performed and also routine blood work.  On his CBC, he noted that his MPV was abnormal.   He did some research on the internet and found it may be a sign of some cancers.  He has history of bladder and prostate cancer, so he is hypervigilant in regards to his blood work and symptoms.    He also has a history of Mycobacterium AVM complex infection and is being followed by pulmonology.  He is also Wilmer Hasten MD for several months of hoarseness.    He has episodes of soreness on the tip of his penis which seems to respond to antifungal medications, but he is concerned that this could be a possible sign of penile cancer as well.  His mother had leukemia as well and that has him concerned as well.  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   Hyperlipidemia    Hypertension    Neuromuscular disorder [facility])     Surgical History: Past Surgical History:  Procedure Laterality Date   CARDIAC CATHETERIZATION     CATARACT EXTRACTION W/PHACO Left 11/20/2015   Procedure: CATARACT EXTRACTION PHACO AND INTRAOCULAR LENS PLACEMENT (IOC);  Surgeon: Elsie Carmine, MD;  Location: [facility] ORS;  Service: Ophthalmology;  Laterality: Left;  US  AP% CDE fluid pack lot # [id number]   CHOLECYSTECTOMY  2011   COLONOSCOPY  2009, 2013   Dr Omar ADENOMA    COLONOSCOPY WITH PROPOFOL  N/A 07/24/2016   Procedure: COLONOSCOPY WITH PROPOFOL ;  Surgeon: Reyes LELON Cota, MD;  Location: [facility] ENDOSCOPY;  Service: Endoscopy;  Laterality: N/A;   COLONOSCOPY WITH PROPOFOL  N/A 07/04/2021   Procedure: COLONOSCOPY WITH PROPOFOL ;  Surgeon: Cota Reyes LELON, MD;  Location: [facility] ENDOSCOPY;  Service: Endoscopy;  Laterality: N/A;  1ST CASE PER OFFICE   cystectomy  2014  ileal conduit diversion    HERNIA REPAIR  2011   umbilical    MITRAL VALVE REPLACEMENT     TONSILLECTOMY  AS CHILD   TRANSTHORACIC ECHOCARDIOGRAM  11-03-2008  DR FATH CHRYL)   LVF NORMAL/ EF 55-60%/ LEFT ATRIAL ENLARGEMENT/  ADEQUATELY FUNCTIONING MITRAL VALVE REPAIR/  MILD MR & TRI/  NO SIGNIFICANT CHANGE FROM PREVIOUS ECHO   TRANSURETHRAL RESECTION OF BLADDER TUMOR N/A 07/27/2012   Procedure: TRANSURETHRAL RESECTION OF BLADDER TUMOR (TURBT);  Surgeon: Garnette Shack, MD;  Location: [facility];  Service: Urology;   Laterality: N/A;  WITH MITOMYCIN  INSTILLATION    urethroptomy  11/05/2016   [place]    valvuloplasty with replacement of cosgrove ring,1 congenital ruptur of cordi  2004   [facility]   mitral valve     Home Medications:  Allergies as of 11/05/2023       Reactions   Codeine Hives, Nausea And Vomiting   codeine        Medication List        Accurate as of November 05, 2023 11:59 PM. If you have any questions, ask your nurse or doctor.          amoxicillin -clavulanate 875-125 MG tablet Commonly known as: AUGMENTIN  Take 1 tablet by mouth 2 (two) times daily.   Atrovent  HFA 17 MCG/ACT inhaler Generic drug: ipratropium Inhale 2 puffs into the lungs every 6 (six) hours as needed for wheezing.   benzonatate  100 MG capsule Commonly known as: Tessalon  Perles Take 1 capsule (100 mg total) by mouth 3 (three) times daily as needed for cough.   carvedilol  20 MG 24 hr capsule Commonly known as: COREG  CR Take 1 capsule (20 mg total) by mouth daily.   clobetasol  cream 0.05 % Commonly known as: TEMOVATE  Apply 1 Application topically 2 (two) times daily. Apply to poison ivy on arm bid until clear, avoid face, groin, axilla   Coreg  6.25 MG tablet Generic drug: carvedilol  Take 6.25 mg by mouth 2 (two) times daily with a meal.   dicyclomine  10 MG capsule Commonly known as: BENTYL  TAKE 1 CAPSULE TWICE DAILY   fluconazole  200 MG tablet Commonly known as: DIFLUCAN  Take 1 tablet (200 mg total) by mouth as directed. Take 1 pill 3 times a week for 1 week per episode for yeast outbreak   gabapentin  300 MG capsule Commonly known as: NEURONTIN  TAKE 1 CAPSULE TWICE DAILY   metroNIDAZOLE  1 % gel Commonly known as: METROGEL  Apply topically daily. Apply to face qhs for Rosacea   mirtazapine  15 MG disintegrating tablet Commonly known as: REMERON  SOL-TAB DISSOLVE 1 TABLET ON THE TONGUE AT BEDTIME   mometasone  0.1 % cream Commonly known as: ELOCON  Apply 1 Application topically  daily as needed (Rash). qd up to 5 days a week to itchy rash on body until clear, then prn flares, avoid face, groin, axilla   nystatin  powder Commonly known as: MYCOSTATIN /NYSTOP  Apply 1 Application topically 3 (three) times daily.   olmesartan 5 MG tablet Commonly known as: BENICAR Take 5 mg by mouth daily.   omeprazole  40 MG capsule Commonly known as: PRILOSEC TAKE 1 CAPSULE BY MOUTH ONCE DAILY   predniSONE  10 MG tablet Commonly known as: DELTASONE  Take 1 tablet (10 mg total) by mouth daily with breakfast. Take 3 pills a day for 3 days, then 2 pills a day for 3 days, then 1 pill a day for 3 days then 1 po every other day for 3 doses, for inflammation in groin  PROBIOTIC & ACIDOPHILUS EX ST PO Take 2 tablets by mouth at bedtime as needed.   Spiriva  Respimat 2.5 MCG/ACT Aers Generic drug: Tiotropium Bromide Monohydrate  Inhale 2 puffs into the lungs daily.   traMADol  50 MG tablet Commonly known as: ULTRAM  Take 1 tablet (50 mg total) by mouth every 6 (six) hours as needed.   Unisom SleepTabs 25 MG tablet Generic drug: doxylamine (Sleep) Take by mouth.   valACYclovir  1000 MG tablet Commonly known as: VALTREX  Take 1 tablet (1,000 mg total) by mouth 2 (two) times daily. 1 po bid for 10 days per fever blister outbreak   vitamin B-12 100 MCG tablet Commonly known as: CYANOCOBALAMIN  Take 100 mcg by mouth daily.        Allergies:  Allergies  Allergen Reactions   Codeine Hives and Nausea And Vomiting    codeine    Family History: Family History  Adopted: Yes  Problem Relation Age of Onset   Colon cancer Mother    Cancer Neg Hx        Prostate,Kidney,Bladder   Prostate cancer Neg Hx    Bladder Cancer Neg Hx    Kidney cancer Neg Hx     Social History:  reports that he quit smoking about 11 years ago. His smoking use included cigarettes. He has never used smokeless tobacco. He reports current alcohol use of about 1.0 standard drink of alcohol per week. He reports  that he does not use drugs.  ROS: Pertinent ROS in HPI  Physical Exam: BP 130/76   Pulse (!) 54   Ht 5' 6 (1.676 m)   Wt 151 lb (68.5 kg)   BMI 24.37 kg/m   Constitutional:  Well nourished. Alert and oriented, No acute distress. HEENT: [place] AT, moist mucus membranes.  Trachea midline Cardiovascular: No clubbing, cyanosis, or edema. Respiratory: Normal respiratory effort, no increased work of breathing. GI: Stoma is pink and healthy.   GU: No CVA tenderness.  No bladder fullness or masses.  Patient with uncircumcised phallus. Foreskin easily retracted  Urethral meatus is patent.  No penile discharge. No penile lesions or rashes. Neurologic: Grossly intact, no focal deficits, moving all 4 extremities. Psychiatric: Normal mood and affect.   Laboratory Data: See HPI and EPIC I have reviewed the labs.   Pertinent Imaging: N/A  Assessment & Plan:    1. Abnormal CBC - He has noticed that the MPV on his CBC has been low and with his prior history of bladder cancer, he is hypervigilant regarding any possibility of having recurrence or the formation of other types of cancer and he would like to be evaluated through hematology to gain their opinion of his risk factors for other types of cancers -Referral sent to hematology  Return if symptoms worsen or fail to improve.  These notes generated with voice recognition software. I apologize for typographical errors.  CLOTILDA HELON RIGGERS  [facility] [address]  [address] [place], [place] [address] [phone]

## 2023-11-05 ENCOUNTER — Ambulatory Visit (INDEPENDENT_AMBULATORY_CARE_PROVIDER_SITE_OTHER): Admitting: Urology

## 2023-11-05 VITALS — BP 130/76 | HR 54 | Ht 66.0 in | Wt 151.0 lb

## 2023-11-05 DIAGNOSIS — R7989 Other specified abnormal findings of blood chemistry: Secondary | ICD-10-CM | POA: Diagnosis not present

## 2023-11-06 DIAGNOSIS — R490 Dysphonia: Secondary | ICD-10-CM | POA: Diagnosis not present

## 2023-11-06 DIAGNOSIS — R053 Chronic cough: Secondary | ICD-10-CM | POA: Diagnosis not present

## 2023-11-14 ENCOUNTER — Encounter: Payer: Self-pay | Admitting: Urology

## 2023-11-14 ENCOUNTER — Telehealth: Payer: Self-pay | Admitting: Internal Medicine

## 2023-11-14 NOTE — Telephone Encounter (Signed)
 CT Abdomen/Pelvis was done at [facility]. The patient copied the results for you to see 10/06/2023 EXAM: CT OF ABDOMEN AND PELVIS WITH CONTRAST CLINICAL HISTORY: History of bladder cancer, prior radical cystectomy and ileal conduit, follow-up TECHNIQUE: * IV Contrast: with contrast * Oral Contrast: None * Acquisition: Axial CT images of abdomen and pelvis * Postprocessing: routine RADIATION DOSE (DLP): 688.59 mGy-cm COMPARISON: MRI bladder/urogram February 13, 2022. CORRELATION: None. FINDINGS: HEPATOBILIARY: Cholecystectomy. SPLEEN: Unremarkable. PANCREAS: Unremarkable. ADRENAL GLANDS: Unremarkable. KIDNEYS: Unremarkable. ABDOMINOPELVIC NODES: No adenopathy. PELVIC ORGANS: Cystoprostatectomy, right lower quadrant ileal conduit. Unchanged parastomal fat-containing hernia. PERITONEUM/MESENTERY/BOWEL: D2/D3 diverticulum. Colonic diverticulosis. BONES/SOFT TISSUES: No suspicious osseous lesion. Partially-imaged right hydrocele. Coronary artery and mitral annulus calcification. OTHER: Right middle lobe and lingula bronchiolitis.

## 2023-11-24 ENCOUNTER — Inpatient Hospital Stay

## 2023-11-24 ENCOUNTER — Encounter: Payer: Self-pay | Admitting: Oncology

## 2023-11-24 ENCOUNTER — Encounter: Admitting: Oncology

## 2023-11-24 ENCOUNTER — Other Ambulatory Visit

## 2023-11-24 ENCOUNTER — Inpatient Hospital Stay: Attending: Oncology | Admitting: Oncology

## 2023-11-24 VITALS — BP 136/66 | HR 58 | Temp 95.0°F | Resp 18 | Ht 66.0 in | Wt 153.0 lb

## 2023-11-24 DIAGNOSIS — Z8546 Personal history of malignant neoplasm of prostate: Secondary | ICD-10-CM | POA: Insufficient documentation

## 2023-11-24 DIAGNOSIS — C679 Malignant neoplasm of bladder, unspecified: Secondary | ICD-10-CM | POA: Diagnosis not present

## 2023-11-24 DIAGNOSIS — M419 Scoliosis, unspecified: Secondary | ICD-10-CM | POA: Diagnosis not present

## 2023-11-24 DIAGNOSIS — I10 Essential (primary) hypertension: Secondary | ICD-10-CM | POA: Diagnosis not present

## 2023-11-24 DIAGNOSIS — Z800 Family history of malignant neoplasm of digestive organs: Secondary | ICD-10-CM | POA: Diagnosis not present

## 2023-11-24 DIAGNOSIS — K5732 Diverticulitis of large intestine without perforation or abscess without bleeding: Secondary | ICD-10-CM | POA: Insufficient documentation

## 2023-11-24 DIAGNOSIS — Z860100 Personal history of colon polyps, unspecified: Secondary | ICD-10-CM | POA: Diagnosis not present

## 2023-11-24 DIAGNOSIS — Z79899 Other long term (current) drug therapy: Secondary | ICD-10-CM | POA: Diagnosis not present

## 2023-11-24 DIAGNOSIS — E785 Hyperlipidemia, unspecified: Secondary | ICD-10-CM | POA: Diagnosis not present

## 2023-11-24 DIAGNOSIS — R011 Cardiac murmur, unspecified: Secondary | ICD-10-CM | POA: Diagnosis not present

## 2023-11-24 DIAGNOSIS — K219 Gastro-esophageal reflux disease without esophagitis: Secondary | ICD-10-CM | POA: Insufficient documentation

## 2023-11-24 DIAGNOSIS — R7989 Other specified abnormal findings of blood chemistry: Secondary | ICD-10-CM

## 2023-11-24 DIAGNOSIS — I4891 Unspecified atrial fibrillation: Secondary | ICD-10-CM | POA: Insufficient documentation

## 2023-11-24 DIAGNOSIS — Z79624 Long term (current) use of inhibitors of nucleotide synthesis: Secondary | ICD-10-CM | POA: Diagnosis not present

## 2023-11-24 NOTE — Progress Notes (Signed)
 Patient is a new patient. Referral for Abnormal CBC.

## 2023-11-24 NOTE — Progress Notes (Signed)
 Hematology/Oncology Consult note [facility] Telephone:([phone] [phone]) [phone]  Patient Care Team: Tony Verneita CROME, MD as PCP - General (Internal Medicine) Tony Reyes ORN, MD (General Surgery) Tony Annah BROCKS, MD as Consulting Physician (Oncology)   Name of the patient: Tony Cox  [id number]  [birth year]    Reason for referral-abnormal CBC   Referring physician-Tony Helon, PA  Date of visit: 11/24/23   History of presenting illness-patient is a 73 year old male with a prior history of prostate cancer T2c N0 being managed at [facility].  Also has a history of T2b TCC positive bladder cancer s/p robotic assisted cystectomy/prostatectomy with ileal conduit diversion in October 2014 for muscle invasive bladder cancer s/p neoadjuvant chemotherapy.  He also underwent urethrectomy with Dr. Candyce in July 2018.  Pathology at that time was benign.  Patient had routine blood work as well as CT scans and [facility] recently and was noted to have abnormal mean platelet volume of 91 based on labs in June 2025.  Hemoglobin at that time was normal at 14 with a platelet count of 200.Tony Cox  Patient is concerned that her abnormal MPV can at times be associated with cancers and therefore requested referral to hematology.  Overall he is doing well and denies any symptoms of bleeding or bruising.  His cancer has remained in remission based on recent scans in June 2025.  ECOG PS- 1  Pain scale- 0   Review of systems- Review of Systems  Constitutional:  Negative for chills, fever, malaise/fatigue and weight loss.  HENT:  Negative for congestion, ear discharge and nosebleeds.   Eyes:  Negative for blurred vision.  Respiratory:  Negative for cough, hemoptysis, sputum production, shortness of breath and wheezing.   Cardiovascular:  Negative for chest pain, palpitations, orthopnea and claudication.  Gastrointestinal:  Negative for abdominal pain, blood in stool,  constipation, diarrhea, heartburn, melena, nausea and vomiting.  Genitourinary:  Negative for dysuria, flank pain, frequency, hematuria and urgency.  Musculoskeletal:  Negative for back pain, joint pain and myalgias.  Skin:  Negative for rash.  Neurological:  Negative for dizziness, tingling, focal weakness, seizures, weakness and headaches.  Endo/Heme/Allergies:  Does not bruise/bleed easily.  Psychiatric/Behavioral:  Negative for depression and suicidal ideas. The patient does not have insomnia.     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   Community acquired pneumonia 04/22/2023   Abnormal CT scan of lung 04/22/2023   History of community acquired pneumonia 07/07/2022   Hospital discharge follow-up 07/07/2022   H/O total cystectomy 12/10/2021   Low testosterone  in male 07/11/2020   COVID-19 virus infection 04/15/2020   Scoliosis due to degenerative disease of spine in adult patient 12/21/2019   History of atrial flutter 09/09/2019   Depression, major, single episode, mild (HCC) 09/09/2019   History of mitral valve repair 09/08/2019   Lymphedema 09/15/2017   Acquired calf asymmetry 08/15/2017   Insomnia 01/27/2017   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   Visit for preventive health examination 10/15/2015   S/P prostatectomy 10/15/2015   Sensory neuropathy (HCC) 10/15/2015   History of colonic polyps 06/14/2015   Rhinitis 05/09/2015   Inguinal hernia 10/10/2014   Personal history of bladder cancer 10/10/2014   Urothelial carcinoma (HCC) 08/26/2012   CHOLEDOCHOLITHIASIS,  HX OF 02/05/2010   UNS ADVRS EFF UNS RX MEDICINAL&BIOLOGICAL SBSTNC 10/09/2007   Hyperlipidemia, unspecified 06/18/2007   Hypertension  06/18/2007   S/P mitral valve repair 06/18/2007     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   History of atrial fibrillation without current medication    POST OP MITRIAL VALVE REPLACEMENT IN 2004   History of mitral valve repair CARDIOLOGIST-- DR FATH--  LOV NOTE W/ CHART 10-30-2011   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   Hyperlipidemia    Hypertension    Neuromuscular disorder [facility])      Past Surgical History:  Procedure Laterality Date   CARDIAC CATHETERIZATION     CATARACT EXTRACTION W/PHACO Left 11/20/2015   Procedure: CATARACT EXTRACTION PHACO AND INTRAOCULAR LENS PLACEMENT (IOC);  Surgeon: Elsie Carmine, MD;  Location: [facility] ORS;  Service: Ophthalmology;  Laterality: Left;  US  AP% CDE fluid pack lot # [id number]   CHOLECYSTECTOMY  2011   COLONOSCOPY  2009, 2013   Dr Omar ADENOMA    COLONOSCOPY WITH PROPOFOL  N/A 07/24/2016   Procedure: COLONOSCOPY WITH PROPOFOL ;  Surgeon: Reyes LELON Cota, MD;  Location: [facility] ENDOSCOPY;  Service: Endoscopy;  Laterality: N/A;   COLONOSCOPY WITH PROPOFOL  N/A 07/04/2021   Procedure: COLONOSCOPY WITH PROPOFOL ;  Surgeon: Cota Reyes LELON, MD;  Location: [facility] ENDOSCOPY;  Service: Endoscopy;  Laterality: N/A;  1ST CASE PER OFFICE   cystectomy  2014   ileal conduit diversion    HERNIA REPAIR  2011   umbilical    MITRAL VALVE REPLACEMENT     TONSILLECTOMY  AS CHILD   TRANSTHORACIC ECHOCARDIOGRAM  11-03-2008  DR FATH CHRYL)   LVF NORMAL/ EF 55-60%/ LEFT ATRIAL ENLARGEMENT/  ADEQUATELY FUNCTIONING MITRAL VALVE REPAIR/  MILD MR & TRI/  NO SIGNIFICANT CHANGE FROM PREVIOUS ECHO   TRANSURETHRAL RESECTION OF BLADDER TUMOR N/A 07/27/2012   Procedure: TRANSURETHRAL  RESECTION OF BLADDER TUMOR (TURBT);  Surgeon: Garnette Shack, MD;  Location: [facility];  Service: Urology;  Laterality: N/A;  WITH MITOMYCIN  INSTILLATION    urethroptomy  11/05/2016   [place]    valvuloplasty with replacement of cosgrove ring,1 congenital ruptur of cordi  2004   [facility]   mitral valve     Social History   Socioeconomic History   Marital status: Single    Spouse name: Not on file   Number of children: Not on file   Years of education: Not on file   Highest education level: Master's degree (e.g., MA, MS, MEng, MEd, MSW, MBA)  Occupational History   Not on file  Tobacco Use   Smoking status: Former    Current packs/day: 0.00    Types: Cigarettes    Quit date: 04/22/2012    Years since quitting: 11.5   Smokeless tobacco: Never   Tobacco comments:    Started smoking college. Was smoking a pack a week.   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    Social Drivers of Health   Financial Resource Strain: Low Risk  (04/20/2023)   Overall Financial Resource Strain (CARDIA)    Difficulty of Paying Living Expenses: Not very hard  Food Insecurity: No Food Insecurity (04/20/2023)   Hunger Vital Sign    Worried About Running Out of Food in the Last Year: Never true    Ran Out of Food in the Last Year: Never true  Transportation Needs: No Transportation Needs (04/20/2023)   PRAPARE - Administrator, Civil Service (Medical): No    Lack of Transportation (Non-Medical): No  Physical Activity: Sufficiently Active (04/20/2023)   Exercise Vital Sign    Days of Exercise per Week: 7 days    Minutes of Exercise per Session: 40 min   Stress: No Stress Concern Present (04/20/2023)   [employer] of Occupational Health - Occupational Stress Questionnaire    Feeling of Stress : Only a little  Social Connections: Unknown (04/20/2023)   Social Connection and Isolation Panel    Frequency of Communication with Friends and Family: More than three times a week    Frequency of Social Gatherings with Friends and Family: Patient declined    Attends Religious Services: Patient declined    Database administrator or Organizations: Yes    Attends Engineer, structural: 1 to 4 times per year    Marital Status: Living with partner  Intimate Partner Violence: Not on file     Family History  Adopted: Yes  Problem Relation Age of Onset   Colon cancer Mother    Cancer Neg Hx        Prostate,Kidney,Bladder   Prostate cancer Neg Hx    Bladder Cancer Neg Hx    Kidney cancer Neg Hx      Current Outpatient Medications:    amoxicillin -clavulanate (AUGMENTIN ) 875-125 MG tablet, Take 1 tablet by mouth 2 (two) times daily., Disp: 20 tablet, Rfl: 0   benzonatate  (TESSALON  PERLES) 100 MG capsule, Take 1 capsule (100 mg total) by mouth 3 (three) times daily as needed for cough., Disp: 20 capsule, Rfl: 0   carvedilol  (COREG  CR) 20 MG 24 hr capsule, Take 1 capsule (20 mg total) by mouth daily., Disp: 90 capsule, Rfl: 2   carvedilol  (COREG ) 6.25 MG tablet, Take 6.25 mg by mouth 2 (two) times daily with a meal., Disp: , Rfl:    clobetasol  cream (TEMOVATE ) 0.05 %, Apply 1 Application topically 2 (two) times daily. Apply to poison ivy on arm bid until clear, avoid face, groin, axilla, Disp: 60 g, Rfl: 0   dicyclomine  (BENTYL ) 10 MG capsule, TAKE 1 CAPSULE TWICE DAILY, Disp: 180 capsule, Rfl: 3   doxylamine, Sleep, (UNISOM SLEEPTABS) 25 MG tablet, Take by mouth., Disp: , Rfl:    fluconazole  (DIFLUCAN ) 200 MG tablet, Take 1 tablet (200 mg total) by mouth as directed. Take 1 pill 3 times a week for 1 week per episode for yeast outbreak,  Disp: 6 tablet, Rfl: 6   gabapentin  (NEURONTIN ) 300 MG capsule, TAKE 1 CAPSULE TWICE DAILY, Disp: 180 capsule, Rfl: 3   ipratropium (ATROVENT  HFA) 17 MCG/ACT inhaler, Inhale 2 puffs into the lungs every 6 (six) hours as needed for wheezing., Disp: 12.9 each, Rfl: 12   metroNIDAZOLE  (METROGEL ) 1 % gel, Apply topically daily. Apply to face qhs for Rosacea, Disp: 45 g, Rfl:  6   mirtazapine  (REMERON  SOL-TAB) 15 MG disintegrating tablet, DISSOLVE 1 TABLET ON THE TONGUE AT BEDTIME, Disp: 90 tablet, Rfl: 3   mometasone  (ELOCON ) 0.1 % cream, Apply 1 Application topically daily as needed (Rash). qd up to 5 days a week to itchy rash on body until clear, then prn flares, avoid face, groin, axilla, Disp: 45 g, Rfl: 6   nystatin  (MYCOSTATIN /NYSTOP ) powder, Apply 1 Application topically 3 (three) times daily., Disp: 60 g, Rfl: 0   olmesartan (BENICAR) 5 MG tablet, Take 5 mg by mouth daily., Disp: , Rfl:    omeprazole  (PRILOSEC) 40 MG capsule, TAKE 1 CAPSULE BY MOUTH ONCE DAILY, Disp: 30 capsule, Rfl: 3   predniSONE  (DELTASONE ) 10 MG tablet, Take 1 tablet (10 mg total) by mouth daily with breakfast. Take 3 pills a day for 3 days, then 2 pills a day for 3 days, then 1 pill a day for 3 days then 1 po every other day for 3 doses, for inflammation in groin, Disp: 21 tablet, Rfl: 6   Probiotic Product (PROBIOTIC & ACIDOPHILUS EX ST PO), Take 2 tablets by mouth at bedtime as needed., Disp: , Rfl:    Tiotropium Bromide Monohydrate  (SPIRIVA  RESPIMAT) 2.5 MCG/ACT AERS, Inhale 2 puffs into the lungs daily., Disp: 4 g, Rfl: 0   traMADol  (ULTRAM ) 50 MG tablet, Take 1 tablet (50 mg total) by mouth every 6 (six) hours as needed., Disp: 30 tablet, Rfl: 0   valACYclovir  (VALTREX ) 1000 MG tablet, Take 1 tablet (1,000 mg total) by mouth 2 (two) times daily. 1 po bid for 10 days per fever blister outbreak, Disp: 20 tablet, Rfl: 6   vitamin B-12 (CYANOCOBALAMIN ) 100 MCG tablet, Take 100 mcg by mouth daily., Disp: , Rfl:    Physical  exam: There were no vitals filed for this visit. Physical Exam Cardiovascular:     Rate and Rhythm: Normal rate and regular rhythm.     Heart sounds: Normal heart sounds.  Pulmonary:     Effort: Pulmonary effort is normal.     Breath sounds: Normal breath sounds.  Abdominal:     General: Bowel sounds are normal.     Palpations: Abdomen is soft.  Skin:    General: Skin is warm and dry.  Neurological:     Mental Status: He is alert and oriented to person, place, and time.           Latest Ref Rng & Units 04/22/2023   11:51 AM  CMP  Glucose 70 - 99 mg/dL 69   BUN 6 - 23 mg/dL 13   Creatinine 9.59 - 1.50 mg/dL 9.26   Sodium 864 - 854 mEq/L 140   Potassium 3.5 - 5.1 mEq/L 4.3   Chloride 96 - 112 mEq/L 102   CO2 19 - 32 mEq/L 31   Calcium 8.4 - 10.5 mg/dL 9.3   Total Protein 6.0 - 8.3 g/dL 6.7   Total Bilirubin 0.2 - 1.2 mg/dL 0.5   Alkaline Phos 39 - 117 U/L 55   AST 0 - 37 U/L 21   ALT 0 - 53 U/L 21       Latest Ref Rng & Units 05/01/2023    3:25 PM  CBC  WBC 4.0 - 10.5 K/uL 5.4   Hemoglobin 13.0 - 17.0 g/dL 85.9   Hematocrit 60.9 - 52.0 % 41.5   Platelets 150 - 400 K/uL 198     Assessment and plan- Patient is a 73 y.o. male referred for low  MPV  Patient was found to have mildly low mean platelet volume of 91 based on his labs from June 2025 and [facility].  CBC including white blood cell count was normal at 4.8 with a normal differential, hemoglobin of 14 and a platelet count of 200 at that time.  Patient does not have any known thrombocytopenia or bleeding or bruising.  No nucleated red blood cells noted in his peripheral smear.  Scans in June 2025 did not show any evidence of cancer recurrence.  Although low MPV could be indicator of hypoproliferative bone marrow which can at times be seen in conditions such as malignancy; given that the patient has no malignancy detected on CT scans and otherwise his platelet counts are normal this does not require any further  workup.  Dr. Marylynn was his primary care doctor and patient can continue getting CBC with differential checked every 6 months.  If there is any new found cytopenias patient can be referred back to me at that time.  No labs warranted today.  Patient has been reassured.   Thank you for this kind referral and the opportunity to participate in the care of this patient   Visit Diagnosis 1. Abnormal CBC     Dr. Annah Skene, MD, MPH [facility] [phone] 11/24/2023

## 2023-11-25 DIAGNOSIS — C7911 Secondary malignant neoplasm of bladder: Secondary | ICD-10-CM | POA: Diagnosis not present

## 2023-11-25 DIAGNOSIS — Z936 Other artificial openings of urinary tract status: Secondary | ICD-10-CM | POA: Diagnosis not present

## 2023-11-28 ENCOUNTER — Encounter: Admitting: Oncology

## 2023-11-28 ENCOUNTER — Other Ambulatory Visit

## 2023-12-05 DIAGNOSIS — C7911 Secondary malignant neoplasm of bladder: Secondary | ICD-10-CM | POA: Diagnosis not present

## 2023-12-05 DIAGNOSIS — Z936 Other artificial openings of urinary tract status: Secondary | ICD-10-CM | POA: Diagnosis not present

## 2023-12-13 ENCOUNTER — Other Ambulatory Visit: Payer: Self-pay | Admitting: Internal Medicine

## 2023-12-18 NOTE — Telephone Encounter (Signed)
 Copied from CRM [id number]. Topic: General - Other >> Dec 18, 2023  2:50 PM Deleta S wrote: Reason for CRM: patient is returning a call to bre from the office letting her know he will be taking the 20 mg

## 2023-12-18 NOTE — Telephone Encounter (Signed)
 Called the Pt to which dosage he is taking he was sure so PT is calling his pharmacy to confirm which one and will be giving the office a call back

## 2024-01-14 ENCOUNTER — Other Ambulatory Visit: Payer: Self-pay | Admitting: Internal Medicine

## 2024-01-18 DIAGNOSIS — R079 Chest pain, unspecified: Secondary | ICD-10-CM | POA: Diagnosis not present

## 2024-01-18 DIAGNOSIS — I480 Paroxysmal atrial fibrillation: Secondary | ICD-10-CM | POA: Diagnosis not present

## 2024-01-18 DIAGNOSIS — C61 Malignant neoplasm of prostate: Secondary | ICD-10-CM | POA: Diagnosis not present

## 2024-01-18 DIAGNOSIS — I10 Essential (primary) hypertension: Secondary | ICD-10-CM | POA: Diagnosis not present

## 2024-01-18 DIAGNOSIS — C679 Malignant neoplasm of bladder, unspecified: Secondary | ICD-10-CM | POA: Diagnosis not present

## 2024-01-18 DIAGNOSIS — K219 Gastro-esophageal reflux disease without esophagitis: Secondary | ICD-10-CM | POA: Diagnosis not present

## 2024-01-18 DIAGNOSIS — J189 Pneumonia, unspecified organism: Secondary | ICD-10-CM | POA: Diagnosis not present

## 2024-01-18 DIAGNOSIS — C680 Malignant neoplasm of urethra: Secondary | ICD-10-CM | POA: Diagnosis not present

## 2024-01-18 DIAGNOSIS — Z79899 Other long term (current) drug therapy: Secondary | ICD-10-CM | POA: Diagnosis not present

## 2024-01-18 DIAGNOSIS — E785 Hyperlipidemia, unspecified: Secondary | ICD-10-CM | POA: Diagnosis not present

## 2024-01-19 DIAGNOSIS — I480 Paroxysmal atrial fibrillation: Secondary | ICD-10-CM | POA: Diagnosis not present

## 2024-01-20 DIAGNOSIS — I4891 Unspecified atrial fibrillation: Secondary | ICD-10-CM | POA: Diagnosis not present

## 2024-01-20 DIAGNOSIS — R0789 Other chest pain: Secondary | ICD-10-CM | POA: Diagnosis not present

## 2024-01-22 ENCOUNTER — Ambulatory Visit: Payer: Self-pay | Admitting: Internal Medicine

## 2024-01-22 NOTE — Telephone Encounter (Signed)
 FYI Only or Action Required?: FYI only for provider.  Patient is followed in Pulmonology for reactive airway, last seen on 10/01/2023 by Isaiah Scrivener, MD.  Called Nurse Triage reporting Nasal Congestion.  Symptoms began several days ago.  Interventions attempted: Rescue inhaler and Increased fluids/rest.  Symptoms are: unchanged.  Triage Disposition: See PCP When Office is Open (Within 3 Days)  Patient/caregiver understands and will follow disposition?: Yes         Copied from CRM [id number]. Topic: Clinical - Red Word Triage >> Jan 22, 2024  1:19 PM Russell PARAS wrote: [facility] that prompted transfer to Nurse Triage:   Symptoms started a few days Hoarse voice Nasal and head congestion Headache History of bronchitis  Pt of Dr. Isaiah Reason for Disposition  Lots of coughing  Answer Assessment - Initial Assessment Questions E2C2 Pulmonary Triage - Initial Assessment Questions Chief Complaint (e.g., cough, sob, wheezing, fever, chills, sweat or additional symptoms) *Go to specific symptom protocol after initial questions. Nasal/head congestion, dull headache, cough, hoarse Went [facility] for tachycardia/afib Monday - has since F/U with cards   How long have symptoms been present? A few days, worse in the past 24 hours  Have you tested for COVID or Flu? Note: If not, ask patient if a home test can be taken. If so, instruct patient to call back for positive results. No  MEDICINES:   Have you used any OTC meds to help with symptoms? No If yes, ask What medications? *No Answer*  Have you used your inhalers/maintenance medication? Yes If yes, What medications? Atrovent  - as prescribed  If inhaler, ask How many puffs and how often? Note: Review instructions on medication in the chart. See above  OXYGEN: Do you wear supplemental oxygen? No If yes, How many liters are you supposed to use? N/a  Do you monitor your oxygen levels? No If yes,  What is your reading (oxygen level) today? N/a  What is your usual oxygen saturation reading?  (Note: Pulmonary O2 sats should be 90% or greater) Endorses in hospital was 97-98         1. LOCATION: Where does it hurt?      Head/nose 2. ONSET: When did the sinus pain start?  (e.g., hours, days)      A few days 3. SEVERITY: How bad is the pain?   (Scale 0-10; or none, mild, moderate or severe)     *No Answer* 4. RECURRENT SYMPTOM: Have you ever had sinus problems before? If Yes, ask: When was the last time? and What happened that time?      *No Answer* 5. NASAL CONGESTION: Is the nose blocked? If Yes, ask: Can you open it or must you breathe through your mouth?     yes 6. NASAL DISCHARGE: Do you have discharge from your nose? If so ask, What color?     denies 7. FEVER: Do you have a fever? If Yes, ask: What is it, how was it measured, and when did it start?      denies 8. OTHER SYMPTOMS: Do you have any other symptoms? (e.g., sore throat, cough, earache, difficulty breathing)     See above 9. PREGNANCY: Is there any chance you are pregnant? When was your last menstrual period?     N/a  Protocols used: Sinus Pain or Congestion-A-AH

## 2024-01-22 NOTE — Telephone Encounter (Signed)
 Pt scheduled to see Dr. Tamea tomorrow.

## 2024-01-23 ENCOUNTER — Ambulatory Visit: Payer: Self-pay | Admitting: Pulmonary Disease

## 2024-01-23 ENCOUNTER — Ambulatory Visit: Admitting: Pulmonary Disease

## 2024-01-23 ENCOUNTER — Encounter: Payer: Self-pay | Admitting: Pulmonary Disease

## 2024-01-23 VITALS — BP 106/68 | HR 58 | Temp 98.0°F | Ht 66.0 in | Wt 151.0 lb

## 2024-01-23 DIAGNOSIS — J45901 Unspecified asthma with (acute) exacerbation: Secondary | ICD-10-CM | POA: Diagnosis not present

## 2024-01-23 DIAGNOSIS — J209 Acute bronchitis, unspecified: Secondary | ICD-10-CM

## 2024-01-23 DIAGNOSIS — R051 Acute cough: Secondary | ICD-10-CM

## 2024-01-23 LAB — NITRIC OXIDE: Nitric Oxide: 59

## 2024-01-23 MED ORDER — PREDNISONE 20 MG PO TABS: 20.0000 mg | ORAL_TABLET | Freq: Every day | ORAL | 0 refills | Status: AC

## 2024-01-23 MED ORDER — ARNUITY ELLIPTA 100 MCG/ACT IN AEPB: 1.0000 | INHALATION_SPRAY | Freq: Every day | RESPIRATORY_TRACT | 3 refills | Status: AC

## 2024-01-23 MED ORDER — DOXYCYCLINE HYCLATE 100 MG PO TABS: 100.0000 mg | ORAL_TABLET | Freq: Two times a day (BID) | ORAL | 0 refills | Status: AC

## 2024-01-23 NOTE — Progress Notes (Signed)
 Subjective:    Patient ID: Tony Cox, male    DOB: 1951-04-10, 73 y.o.   MRN: [id number]  Patient Care Team: Marylynn Verneita CROME, MD as PCP - General (Internal Medicine) Dessa Reyes ORN, MD (General Surgery) Melanee Annah BROCKS, MD as Consulting Physician (Oncology)  Chief Complaint  Patient presents with   Acute Visit    Cough, shortness of breath and wheezing x 1 week.     BACKGROUND/INTERVAL: Patient is a 73 year old remote former smoker who presents for an ACUTE visit due to cough, shortness of breath and wheezing of 1 weeks duration.  HPI Discussed the use of AI scribe software for clinical note transcription with the patient, who gave verbal consent to proceed.  History of Present Illness   Tony Cox is a 73 year old male with asthma who presents with cough, shortness of breath, and wheezing. He presents for evaluation of respiratory symptoms.  This is an ACUTE visit.  He has been experiencing cough, shortness of breath, and wheezing for the past week, accompanied by hoarseness, significant drainage, and headaches. 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 This was his first episode, and he is currently taking Eliquis and Coreg  while undergoing further testing to ensure it was an isolated incident.  He has a history of recurrent bronchitis and uses Atrovent , which he believes is more heart-friendly than albuterol .  In June, he underwent a CT scan at [facility] as part of a routine checkup for bladder cancer, which noted bronchitis in the lung. He is concerned this may be lingering. A recent x-ray on Sunday was clear, and flu and COVID tests last night were negative.  He is worried about potential exposure to mold and mildew at his  workplace, which he believes may be aggravating his asthma. He has discussed the presence of carpet in his classroom with the head of the school, but no action has been taken.     He tested negative with home test for flu and COVID.  Review of Systems A 10 point review of systems was performed and it is as noted above otherwise negative.   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   Community acquired pneumonia 04/22/2023   Abnormal CT scan of lung 04/22/2023   History of community acquired pneumonia 07/07/2022   Hospital discharge follow-up 07/07/2022   H/O total cystectomy 12/10/2021   Low testosterone  in male 07/11/2020   COVID-19 virus infection 04/15/2020   Scoliosis due to degenerative disease of spine in adult patient 12/21/2019   History of atrial flutter 09/09/2019   Depression, major, single episode, mild 09/09/2019   History of mitral valve repair 09/08/2019   Lymphedema 09/15/2017   Acquired calf asymmetry 08/15/2017   Insomnia 01/27/2017   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   Visit for preventive health examination 10/15/2015   S/P prostatectomy 10/15/2015   Sensory neuropathy (HCC) 10/15/2015   History of colonic polyps 06/14/2015   Rhinitis 05/09/2015   Inguinal hernia 10/10/2014   Personal history of bladder cancer 10/10/2014   Urothelial carcinoma (HCC) 08/26/2012   CHOLEDOCHOLITHIASIS, HX OF 02/05/2010  UNS ADVRS EFF UNS RX MEDICINAL&BIOLOGICAL SBSTNC 10/09/2007   Hyperlipidemia, unspecified 06/18/2007   Hypertension 06/18/2007   S/P mitral valve repair 06/18/2007    Social History   Tobacco Use   Smoking status: Former    Current packs/day: 0.00    Types: Cigarettes    Quit date: 04/22/2012    Years since quitting: 11.7   Smokeless tobacco: Never   Tobacco comments:     Started smoking college. Was smoking a pack a week.   Substance Use Topics   Alcohol use: Yes    Alcohol/week: 1.0 standard drink of alcohol    Types: 1 Standard drinks or equivalent per week    Comment: occasional    Allergies  Allergen Reactions   Codeine Hives and Nausea And Vomiting    codeine    Current Meds  Medication Sig   carvedilol  (COREG  CR) 20 MG 24 hr capsule TAKE 1 CAPSULE BY MOUTH DAILY   clobetasol  cream (TEMOVATE ) 0.05 % Apply 1 Application topically 2 (two) times daily. Apply to poison ivy on arm bid until clear, avoid face, groin, axilla   dicyclomine  (BENTYL ) 10 MG capsule TAKE 1 CAPSULE TWICE DAILY   diltiazem (CARDIZEM) 30 MG tablet Take 30 mg by mouth.   doxycycline  (VIBRA -TABS) 100 MG tablet Take 1 tablet (100 mg total) by mouth 2 (two) times daily for 7 days.   doxylamine, Sleep, (UNISOM SLEEPTABS) 25 MG tablet Take by mouth.   ELIQUIS 5 MG TABS tablet Take 5 mg by mouth 2 (two) times daily.   Fluticasone  Furoate (ARNUITY ELLIPTA) 100 MCG/ACT AEPB Inhale 1 puff into the lungs daily.   gabapentin  (NEURONTIN ) 300 MG capsule TAKE 1 CAPSULE TWICE DAILY   ipratropium (ATROVENT  HFA) 17 MCG/ACT inhaler Inhale 2 puffs into the lungs every 6 (six) hours as needed for wheezing.   metroNIDAZOLE  (METROGEL ) 1 % gel Apply topically daily. Apply to face qhs for Rosacea   mirtazapine  (REMERON  SOL-TAB) 15 MG disintegrating tablet DISSOLVE 1 TABLET ON THE TONGUE AT BEDTIME   mometasone  (ELOCON ) 0.1 % cream Apply 1 Application topically daily as needed (Rash). qd up to 5 days a week to itchy rash on body until clear, then prn flares, avoid face, groin, axilla   nystatin  (MYCOSTATIN /NYSTOP ) powder Apply 1 Application topically 3 (three) times daily.   olmesartan (BENICAR) 5 MG tablet Take 5 mg by mouth daily.   omeprazole  (PRILOSEC) 40 MG capsule TAKE 1 CAPSULE BY MOUTH ONCE DAILY   predniSONE  (DELTASONE ) 20 MG tablet Take 1 tablet (20 mg total) by mouth daily with breakfast  for 5 days.   Probiotic Product (PROBIOTIC & ACIDOPHILUS EX ST PO) Take 2 tablets by mouth at bedtime as needed.   traMADol  (ULTRAM ) 50 MG tablet Take 1 tablet (50 mg total) by mouth every 6 (six) hours as needed.   valACYclovir  (VALTREX ) 1000 MG tablet Take 1 tablet (1,000 mg total) by mouth 2 (two) times daily. 1 po bid for 10 days per fever blister outbreak   vitamin B-12 (CYANOCOBALAMIN ) 100 MCG tablet Take 100 mcg by mouth daily.    Immunization History  Administered Date(s) Administered   INFLUENZA, HIGH DOSE SEASONAL PF 01/14/2018, 12/26/2018, 02/08/2020   Influenza Split 03/11/2023   Influenza Whole 01/20/2009, 02/05/2010   Influenza-Unspecified 01/26/2015, 01/20/2016, 02/03/2017, 02/03/2021   Moderna Covid-19 Fall Seasonal Vaccine 52yrs & older 02/07/2023   PFIZER(Purple Top)SARS-COV-2 Vaccination 06/08/2019, 07/06/2019, 01/13/2020   PNEUMOCOCCAL CONJUGATE-20 06/05/2021   Pfizer(Comirnaty)Fall Seasonal Vaccine 12 years and older 07/23/2020, 01/01/2021,  01/27/2022   Pneumococcal Conjugate-13 01/26/2015   Respiratory Syncytial Virus Vaccine,Recomb Aduvanted(Arexvy) 12/25/2021   Td 05/23/2001   Tdap 08/14/2017        Objective:     BP 106/68   Pulse (!) 58   Temp 98 F (36.7 C) (Temporal)   Ht 5' 6 (1.676 m)   Wt 151 lb (68.5 kg)   SpO2 100%   BMI 24.37 kg/m   SpO2: 100 %  GENERAL: Well-developed, well-nourished gentleman, appears acutely ill but not toxic.  Mild hoarseness noted. HEAD: Normocephalic, atraumatic.  EYES: Pupils equal, round, reactive to light.  No scleral icterus.  MOUTH: Dentition intact, oral mucosa moist.  No thrush. NECK: Supple. No thyromegaly. Trachea midline. No JVD.  No adenopathy. PULMONARY: Good air entry bilaterally.  Coarse, rhonchi noted particularly in the right lower lung zone. CARDIOVASCULAR: S1 and S2. Regular rate and rhythm.  Grade 1/6 systolic ejection murmur. ABDOMEN: Urostomy bag present. MUSCULOSKELETAL: No joint deformity,  no clubbing, no edema.  NEUROLOGIC: No overt focal deficit, no gait disturbance, speech is fluent. SKIN: Intact,warm,dry. PSYCH: Mood and behavior normal  Lab Results  Component Value Date   NITRICOXIDE 59 01/23/2024  *There is evidence of type II inflammation     Assessment & Plan:     ICD-10-CM   1. Acute cough  R05.1 DG Chest 2 View    Nitric oxide    2. Exacerbation of persistent asthma, unspecified asthma severity  J45.901     3. Acute bronchitis, unspecified organism  J20.9       Orders Placed This Encounter  Procedures   DG Chest 2 View    Standing Status:   Future    Expected Date:   01/23/2024    Expiration Date:   01/22/2025    Reason for Exam (SYMPTOM  OR DIAGNOSIS REQUIRED):   Cough,shortness of breath    Preferred imaging location?:   [facility]   Nitric oxide    Meds ordered this encounter  Medications   doxycycline  (VIBRA -TABS) 100 MG tablet    Sig: Take 1 tablet (100 mg total) by mouth 2 (two) times daily for 7 days.    Dispense:  14 tablet    Refill:  0   Fluticasone  Furoate (ARNUITY ELLIPTA) 100 MCG/ACT AEPB    Sig: Inhale 1 puff into the lungs daily.    Dispense:  30 each    Refill:  3   predniSONE  (DELTASONE ) 20 MG tablet    Sig: Take 1 tablet (20 mg total) by mouth daily with breakfast for 5 days.    Dispense:  5 tablet    Refill:  0   Discussion:    Asthma with acute exacerbation and airway inflammation Asthma exacerbation with high airway inflammation, likely triggered by environmental factors such as mold and mildew exposure. Symptoms include cough, shortness of breath, and wheezing. Negative for flu and COVID-19. Type II inflammation confirmed by elevated nitric oxide of 59 ppb. Current medications include Atrovent  as needed and Eliquis for AFib. Arnuity inhaler is heart-friendly, patient wants to avoid LABA's. - Prescribe doxycycline  for atypical infection coverage - Prescribe prednisone  for 5 days to reduce airway  inflammation - Prescribe Arnuity inhaler 100 mcg, one puff daily, and instruct to rinse mouth after use - Continue Atrovent  as needed - Schedule follow-up appointment in 1-2 weeks to assess response to treatment - Coordinate with Dr. Isaiah for potential maintenance inhaler and allergy testing post-acute phase  Acute bronchitis Current symptoms include hoarseness, drainage, and  cough. Recent chest x-ray was clear, but another x-ray is planned to ensure no progression. Inflammation in the airway is being monitored. - Order chest x-ray to evaluate current status of bronchitis ensure no progression to pneumonia    Advised if symptoms do not improve or worsen, to please contact office for sooner follow up or seek emergency care.    I spent 41 minutes of dedicated to the care of this patient on the date of this encounter to include pre-visit review of records, face-to-face time with the patient discussing conditions above, post visit ordering of testing, clinical documentation with the electronic health record, making appropriate referrals as documented, and communicating necessary findings to members of the patients care team.     C. Leita Sanders, MD Advanced Bronchoscopy PCCM [facility]    *This note was generated using voice recognition software/Dragon and/or AI transcription program.  Despite best efforts to proofread, errors can occur which can change the meaning. Any transcriptional errors that result from this process are unintentional and may not be fully corrected at the time of dictation.

## 2024-01-23 NOTE — Telephone Encounter (Signed)
 FYI Only or Action Required?: FYI only for provider.  Patient is followed in Pulmonology for bronchiectasis, last seen on 01/23/2024 by Tamea Dedra CROME, MD.  Called Nurse Triage reporting Medication Problem.  Triage Disposition: Call PCP When Office is Open  Patient/caregiver understands and will follow disposition?: Yes      Copied from CRM [id number]. Topic: Clinical - Prescription Issue >> Jan 23, 2024  4:25 PM Leila BROCKS wrote: Reason for CRM: Patient [phone] states saw Dr. Tamea today and the medications is incorrect: Prednisone  20 mg 1 tablet for 5 days and doxycycline  (VIBRA -TABS) 100 MG tablet 1 tablet twice a day for 7 days. Patient states cannot Prednisone  without tapering down and wants to speak with triage nurse. Please advise.       Reason for Disposition  [1] Caller has NON-URGENT medicine question about med that PCP prescribed AND [2] triager unable to answer question  Answer Assessment - Initial Assessment Questions Patient calling for clarification of his Prednisone  prescription. I advised that the notes show it was for 5 days and did not mention a taper. Patient confirms understanding of this and states he will reach back out next week. Please advise.      1. NAME of MEDICINE: What medicine(s) are you calling about?     Prednisone   2. QUESTION: What is your question? (e.g., double dose of medicine, side effect)     Patient is concerned because his Prednisone  is not a taper and it usually is. 3. PRESCRIBER: Who prescribed the medicine? Reason: if prescribed by specialist, call should be referred to that group.     Dr. Lenda  Protocols used: Medication Question Call-A-AH

## 2024-01-23 NOTE — Patient Instructions (Signed)
 VISIT SUMMARY:  Today, you were seen for your respiratory symptoms, including cough, shortness of breath, and wheezing. We discussed your recent episode of atrial fibrillation and your concerns about potential mold exposure at your workplace. Your recent tests for flu and COVID-19 were negative, and your chest x-ray was clear. We have developed a plan to address your asthma exacerbation and acute bronchitis.  YOUR PLAN:  -ASTHMA WITH ACUTE EXACERBATION AND AIRWAY INFLAMMATION: You are experiencing a worsening of your asthma symptoms, likely due to environmental factors such as mold and mildew. This condition is causing inflammation in your airways, leading to cough, shortness of breath, and wheezing. We will start you on doxycycline  to cover any atypical infections and prednisone  for 5 days to reduce inflammation. You will also use an Arnuity inhaler once daily and continue using Atrovent  as needed. Please rinse your mouth after using the Arnuity inhaler. We will follow up in 1-2 weeks to see how you are responding to the treatment. Additionally, we will coordinate with Dr. Isaiah for potential maintenance inhaler and allergy testing after the acute phase.  -ACUTE BRONCHITIS: You have inflammation in your airways, causing hoarseness, drainage, and cough. Although your recent chest x-ray was clear, we will order another x-ray to ensure there is no progression of the bronchitis. We will also monitor the level of inflammation in your airway.  INSTRUCTIONS:  Please schedule a follow-up appointment in 1-2 weeks to assess your response to the treatment. We will also coordinate with Dr.Kasa for potential maintenance inhaler and allergy testing after the acute phase. Additionally, please get a chest x-ray as ordered to evaluate the current status of your bronchitis.

## 2024-01-26 ENCOUNTER — Ambulatory Visit: Admitting: Dermatology

## 2024-01-27 DIAGNOSIS — R0789 Other chest pain: Secondary | ICD-10-CM | POA: Diagnosis not present

## 2024-01-27 DIAGNOSIS — R002 Palpitations: Secondary | ICD-10-CM | POA: Diagnosis not present

## 2024-01-29 DIAGNOSIS — Z7189 Other specified counseling: Secondary | ICD-10-CM | POA: Diagnosis not present

## 2024-01-29 DIAGNOSIS — I480 Paroxysmal atrial fibrillation: Secondary | ICD-10-CM | POA: Diagnosis not present

## 2024-01-29 DIAGNOSIS — I10 Essential (primary) hypertension: Secondary | ICD-10-CM | POA: Diagnosis not present

## 2024-01-29 DIAGNOSIS — Z8679 Personal history of other diseases of the circulatory system: Secondary | ICD-10-CM | POA: Diagnosis not present

## 2024-01-29 DIAGNOSIS — Z23 Encounter for immunization: Secondary | ICD-10-CM | POA: Diagnosis not present

## 2024-01-29 DIAGNOSIS — R001 Bradycardia, unspecified: Secondary | ICD-10-CM | POA: Diagnosis not present

## 2024-02-02 ENCOUNTER — Other Ambulatory Visit: Payer: Self-pay | Admitting: Urology

## 2024-02-02 MED ORDER — FLUCONAZOLE 100 MG PO TABS: 100.0000 mg | ORAL_TABLET | Freq: Every day | ORAL | 0 refills | Status: DC

## 2024-02-02 NOTE — Progress Notes (Signed)
 Patient called the office stating he had a return of his yeast infection at the stoma site.

## 2024-02-04 ENCOUNTER — Ambulatory Visit: Admitting: Dermatology

## 2024-02-09 DIAGNOSIS — R002 Palpitations: Secondary | ICD-10-CM | POA: Diagnosis not present

## 2024-02-09 DIAGNOSIS — R0789 Other chest pain: Secondary | ICD-10-CM | POA: Diagnosis not present

## 2024-02-10 ENCOUNTER — Encounter: Payer: Self-pay | Admitting: Pulmonary Disease

## 2024-02-10 ENCOUNTER — Ambulatory Visit (INDEPENDENT_AMBULATORY_CARE_PROVIDER_SITE_OTHER): Admitting: Pulmonary Disease

## 2024-02-10 VITALS — BP 84/60 | HR 60 | Temp 97.6°F | Ht 66.0 in | Wt 153.6 lb

## 2024-02-10 DIAGNOSIS — R0602 Shortness of breath: Secondary | ICD-10-CM

## 2024-02-10 DIAGNOSIS — Z87891 Personal history of nicotine dependence: Secondary | ICD-10-CM | POA: Diagnosis not present

## 2024-02-10 DIAGNOSIS — J4540 Moderate persistent asthma, uncomplicated: Secondary | ICD-10-CM

## 2024-02-10 LAB — NITRIC OXIDE: Nitric Oxide: 31

## 2024-02-10 MED ORDER — DOXYCYCLINE HYCLATE 100 MG PO TABS: 100.0000 mg | ORAL_TABLET | Freq: Two times a day (BID) | ORAL | 0 refills | Status: AC

## 2024-02-10 NOTE — Progress Notes (Unsigned)
 Subjective:    Patient ID: Tony Cox, male    DOB: 1950/09/22, 73 y.o.   MRN: [id number]  Patient Care Team: Marylynn Verneita CROME, MD as PCP - General (Internal Medicine) Dessa Reyes ORN, MD (General Surgery) Melanee Annah BROCKS, MD as Consulting Physician (Oncology)  Chief Complaint  Patient presents with   Asthma    Shortness of breath on exertion. No wheezing.     BACKGROUND/INTERVAL:Patient is a 73 year old remote former smoker who presents for follow-up after an ACUTE visit due to cough, shortness of breath and wheezing on 23 January 2024.  Patient has transferred care to Dr. Isaiah.  HPI Discussed the use of AI scribe software for clinical note transcription with the patient, who gave verbal consent to proceed.  History of Present Illness   Tony Cox is a 73 year old male with asthma who presents for management of airway inflammation.  He reports feeling less congested than before. He is currently using a medium-dose Arnuity inhaler effectively and has reduced his use of Atrovent  for rescue.  He was hospitalized over two weeks ago due to an episode of atrial fibrillation, which he had experienced once before when he was very sick with the flu and double pneumonia. During the recent episode, he was unable to use his old Cardizem medication and sought care at [facility], where his condition was managed. He is now on metoprolol 50 mg twice daily, an unspecified dose of losartan, and Eliquis. His blood pressure has been good, though it was 80 today, making him feel 'a little washed out.'  He is concerned about getting sick, as colds tend to exacerbate his lung congestion. He has a busy performance season coming up and wishes to avoid illness. He requests additional medication (antibiotic) for reserve in case he starts getting sick.     Review of Systems A 10 point review of systems was performed and it is as noted above otherwise negative.   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   Community acquired pneumonia 04/22/2023   Abnormal CT scan of lung 04/22/2023   History of community acquired pneumonia 07/07/2022   Hospital discharge follow-up 07/07/2022   H/O total cystectomy 12/10/2021   Low testosterone  in male 07/11/2020   COVID-19 virus infection 04/15/2020   Scoliosis due to degenerative disease of spine in adult patient 12/21/2019   History of atrial flutter 09/09/2019   Depression, major, single episode, mild 09/09/2019   History of mitral valve repair 09/08/2019   Lymphedema 09/15/2017   Acquired calf asymmetry 08/15/2017   Insomnia 01/27/2017   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   Visit for preventive health examination 10/15/2015   S/P prostatectomy 10/15/2015   Sensory neuropathy (HCC) 10/15/2015   History of colonic polyps 06/14/2015   Rhinitis 05/09/2015   Inguinal hernia 10/10/2014   Personal history of bladder cancer 10/10/2014   Urothelial carcinoma (HCC) 08/26/2012   CHOLEDOCHOLITHIASIS, HX OF 02/05/2010   UNS ADVRS EFF UNS RX MEDICINAL&BIOLOGICAL SBSTNC 10/09/2007   Hyperlipidemia, unspecified 06/18/2007   Hypertension 06/18/2007   S/P mitral valve repair 06/18/2007    Social History   Tobacco Use   Smoking status: Former    Current packs/day: 0.00    Types: Cigarettes    Quit date: 04/22/2012    Years since quitting: 11.8   Smokeless tobacco: Never  Tobacco comments:    Started smoking college. Was smoking a pack a week.   Substance Use Topics   Alcohol use: Yes    Alcohol/week: 1.0 standard drink of alcohol    Types: 1 Standard drinks or equivalent per week    Comment: occasional    Allergies  Allergen Reactions   Codeine Hives and Nausea And Vomiting    codeine    Current Meds  Medication Sig   clobetasol  cream  (TEMOVATE ) 0.05 % Apply 1 Application topically 2 (two) times daily. Apply to poison ivy on arm bid until clear, avoid face, groin, axilla   dicyclomine  (BENTYL ) 10 MG capsule TAKE 1 CAPSULE TWICE DAILY   diltiazem (CARDIZEM) 30 MG tablet Take 30 mg by mouth. (Patient taking differently: Take 30 mg by mouth. prn)   doxycycline  (VIBRA -TABS) 100 MG tablet Take 1 tablet (100 mg total) by mouth 2 (two) times daily for 7 days.   doxylamine, Sleep, (UNISOM SLEEPTABS) 25 MG tablet Take by mouth.   ELIQUIS 5 MG TABS tablet Take 5 mg by mouth 2 (two) times daily.   fluconazole  (DIFLUCAN ) 100 MG tablet Take 1 tablet (100 mg total) by mouth daily. X 7 days   Fluticasone  Furoate (ARNUITY ELLIPTA) 100 MCG/ACT AEPB Inhale 1 puff into the lungs daily.   gabapentin  (NEURONTIN ) 300 MG capsule TAKE 1 CAPSULE TWICE DAILY   ipratropium (ATROVENT  HFA) 17 MCG/ACT inhaler Inhale 2 puffs into the lungs every 6 (six) hours as needed for wheezing.   metoprolol tartrate (LOPRESSOR) 25 MG tablet Take 25 mg by mouth 2 (two) times daily.   metroNIDAZOLE  (METROGEL ) 1 % gel Apply topically daily. Apply to face qhs for Rosacea (Patient taking differently: Apply topically daily. Apply to face qhs for Rosacea. PRN)   mirtazapine  (REMERON  SOL-TAB) 15 MG disintegrating tablet DISSOLVE 1 TABLET ON THE TONGUE AT BEDTIME   mometasone  (ELOCON ) 0.1 % cream Apply 1 Application topically daily as needed (Rash). qd up to 5 days a week to itchy rash on body until clear, then prn flares, avoid face, groin, axilla   nystatin  (MYCOSTATIN /NYSTOP ) powder Apply 1 Application topically 3 (three) times daily. (Patient taking differently: Apply 1 Application topically 3 (three) times daily. PRN)   olmesartan (BENICAR) 5 MG tablet Take 5 mg by mouth daily.   omeprazole  (PRILOSEC) 40 MG capsule TAKE 1 CAPSULE BY MOUTH ONCE DAILY   Probiotic Product (PROBIOTIC & ACIDOPHILUS EX ST PO) Take 2 tablets by mouth at bedtime as needed.   traMADol  (ULTRAM ) 50 MG  tablet Take 1 tablet (50 mg total) by mouth every 6 (six) hours as needed.   valACYclovir  (VALTREX ) 1000 MG tablet Take 1 tablet (1,000 mg total) by mouth 2 (two) times daily. 1 po bid for 10 days per fever blister outbreak (Patient taking differently: Take 1,000 mg by mouth 2 (two) times daily. 1 po bid for 10 days per fever blister outbreak. PRN)   vitamin B-12 (CYANOCOBALAMIN ) 100 MCG tablet Take 100 mcg by mouth daily.    Immunization History  Administered Date(s) Administered    sv, Bivalent, Protein Subunit Rsvpref,pf (Abrysvo) 12/25/2021   INFLUENZA, HIGH DOSE SEASONAL PF 01/14/2018, 12/26/2018, 02/08/2020, 01/19/2022   Influenza Split 03/11/2023   Influenza Whole 01/20/2009, 02/05/2010   Influenza-Unspecified 01/26/2015, 01/20/2016, 02/03/2017, 02/03/2021   Moderna Covid-19 Fall Seasonal Vaccine 55yrs & older 02/07/2023   PFIZER(Purple Top)SARS-COV-2 Vaccination 05/13/2019, 06/08/2019, 07/06/2019, 01/13/2020   PNEUMOCOCCAL CONJUGATE-20 06/05/2021   Pfizer(Comirnaty)Fall Seasonal Vaccine 12 years and older 07/23/2020, 01/01/2021, 01/27/2022  Pneumococcal Conjugate-13 01/26/2015   Respiratory Syncytial Virus Vaccine,Recomb Aduvanted(Arexvy) 12/25/2021   Td 05/23/2001   Tdap 08/14/2017        Objective:     BP (!) 84/60   Pulse 60   Temp 97.6 F (36.4 C) (Temporal)   Ht 5' 6 (1.676 m)   Wt 153 lb 9.6 oz (69.7 kg)   SpO2 96%   BMI 24.79 kg/m   SpO2: 96 %  GENERAL: Well-developed, well-nourished gentleman, appears acutely ill but not toxic.  Mild hoarseness noted. HEAD: Normocephalic, atraumatic.  EYES: Pupils equal, round, reactive to light.  No scleral icterus.  MOUTH: Dentition intact, oral mucosa moist.  No thrush. NECK: Supple. No thyromegaly. Trachea midline. No JVD.  No adenopathy. PULMONARY: Good air entry bilaterally.  Coarse, rhonchi noted particularly in the right lower lung zone. CARDIOVASCULAR: S1 and S2. Regular rate and rhythm.  Grade 1/6 systolic  ejection murmur. ABDOMEN: Urostomy bag present. MUSCULOSKELETAL: No joint deformity, no clubbing, no edema.  NEUROLOGIC: No overt focal deficit, no gait disturbance, speech is fluent. SKIN: Intact,warm,dry. PSYCH: Mood and behavior normal.  Lab Results  Component Value Date   NITRICOXIDE 31 02/10/2024  *Intermediate level of nitric oxide suggesting mild type II inflammation present. *Trend:59>>31 ppb       Assessment & Plan:     ICD-10-CM   1. Moderate persistent asthma without complication  J45.40     2. Shortness of breath  R06.02 Nitric oxide      Orders Placed This Encounter  Procedures   Nitric oxide    Meds ordered this encounter  Medications   doxycycline  (VIBRA -TABS) 100 MG tablet    Sig: Take 1 tablet (100 mg total) by mouth 2 (two) times daily for 7 days.    Dispense:  14 tablet    Refill:  0   Discussion:    Moderate persistent asthma  Better control with a decrease in nitric oxide from 59 to 31. He reports feeling less congested and is using the inhaler effectively. He has used Atrovent  for rescue but not as frequently as before. He is concerned about potential exacerbations due to colds, which historically lead to lung congestion. - Continue current inhaler regimen at medium dose (Arnuity 100). - Avoiding SABA/LABA due to issues with exacerbating atrial fibrillation. - Prescribed antibiotics for potential respiratory infections. - Ensured availability of Atrovent  for rescue use.  Shortness of breath Likely related to asthma. He reports feeling washed out due to low blood pressure, which may be related to his recent hospitalization for atrial fibrillation and current medication regimen. - Monitor blood pressure and adjust medications as needed in consultation with cardiologist.     Follow-up with Dr. Isaiah on 12 November as scheduled.  Advised if symptoms do not improve or worsen, to please contact office for sooner follow up or seek emergency care.     I spent 30 minutes of dedicated to the care of this patient on the date of this encounter to include pre-visit review of records, face-to-face time with the patient discussing conditions above, post visit ordering of testing, clinical documentation with the electronic health record, making appropriate referrals as documented, and communicating necessary findings to members of the patients care team.     C. Leita Sanders, MD Advanced Bronchoscopy PCCM [facility]    *This note was generated using voice recognition software/Dragon and/or AI transcription program.  Despite best efforts to proofread, errors can occur which can change the meaning. Any transcriptional errors that result  from this process are unintentional and may not be fully corrected at the time of dictation.

## 2024-02-10 NOTE — Patient Instructions (Signed)
 VISIT SUMMARY:  Tony Cox, you visited today for the management of your asthma and to discuss your recent episode of atrial fibrillation. You reported feeling less congested and are effectively using your medium-dose inhaler. We also discussed your concerns about potential respiratory infections and your upcoming busy performance season.  YOUR PLAN:  -ASTHMA: Asthma is a condition where your airways become inflamed and narrow, making it hard to breathe. Your asthma is well-controlled, and your airway inflammation has decreased. Continue using your medium-dose Arnuity inhaler as prescribed. I have also prescribed antibiotics to use if you start to develop a respiratory infection. Keep your Atrovent  available for rescue use if needed.  -SHORTNESS OF BREATH: Your shortness of breath is likely related to your asthma and recent low blood pressure. This may be due to your recent hospitalization for atrial fibrillation and the medications you are currently taking. We will monitor your blood pressure and adjust your medications as needed in consultation with your cardiologist.  INSTRUCTIONS:  Please monitor your blood pressure regularly and report any significant changes or symptoms to your cardiologist. Use the prescribed antibiotics if you start to develop symptoms of a respiratory infection. Continue using your medium-dose inhaler and keep Atrovent  available for rescue use. Follow up with your cardiologist as needed.

## 2024-02-11 NOTE — Telephone Encounter (Signed)
 Patient has seen  Dr. Tamea on 01/23/24 and 02/10/24 no mention of Chest CT

## 2024-02-13 ENCOUNTER — Encounter: Payer: Self-pay | Admitting: Pulmonary Disease

## 2024-02-28 ENCOUNTER — Other Ambulatory Visit: Payer: Self-pay | Admitting: Internal Medicine

## 2024-03-01 ENCOUNTER — Ambulatory Visit: Admitting: Dermatology

## 2024-03-01 DIAGNOSIS — E782 Mixed hyperlipidemia: Secondary | ICD-10-CM | POA: Diagnosis not present

## 2024-03-01 DIAGNOSIS — I10 Essential (primary) hypertension: Secondary | ICD-10-CM | POA: Diagnosis not present

## 2024-03-01 DIAGNOSIS — Z952 Presence of prosthetic heart valve: Secondary | ICD-10-CM | POA: Diagnosis not present

## 2024-03-01 DIAGNOSIS — I480 Paroxysmal atrial fibrillation: Secondary | ICD-10-CM | POA: Diagnosis not present

## 2024-03-01 DIAGNOSIS — Z87891 Personal history of nicotine dependence: Secondary | ICD-10-CM | POA: Diagnosis not present

## 2024-03-03 ENCOUNTER — Encounter: Payer: Self-pay | Admitting: Internal Medicine

## 2024-03-03 ENCOUNTER — Ambulatory Visit: Admitting: Internal Medicine

## 2024-03-03 VITALS — BP 100/60 | HR 59 | Temp 98.0°F | Ht 66.0 in | Wt 155.0 lb

## 2024-03-03 DIAGNOSIS — I4891 Unspecified atrial fibrillation: Secondary | ICD-10-CM | POA: Diagnosis not present

## 2024-03-03 DIAGNOSIS — Z8551 Personal history of malignant neoplasm of bladder: Secondary | ICD-10-CM | POA: Diagnosis not present

## 2024-03-03 DIAGNOSIS — R9389 Abnormal findings on diagnostic imaging of other specified body structures: Secondary | ICD-10-CM

## 2024-03-03 DIAGNOSIS — Z952 Presence of prosthetic heart valve: Secondary | ICD-10-CM

## 2024-03-03 DIAGNOSIS — J4540 Moderate persistent asthma, uncomplicated: Secondary | ICD-10-CM | POA: Diagnosis not present

## 2024-03-03 DIAGNOSIS — G4733 Obstructive sleep apnea (adult) (pediatric): Secondary | ICD-10-CM

## 2024-03-03 DIAGNOSIS — A150 Tuberculosis of lung: Secondary | ICD-10-CM

## 2024-03-03 DIAGNOSIS — A310 Pulmonary mycobacterial infection: Secondary | ICD-10-CM

## 2024-03-03 MED ORDER — ARNUITY ELLIPTA 200 MCG/ACT IN AEPB: 1.0000 | INHALATION_SPRAY | Freq: Every day | RESPIRATORY_TRACT | 3 refills | Status: AC

## 2024-03-03 MED ORDER — PREDNISONE 10 MG PO TABS: 10.0000 mg | ORAL_TABLET | Freq: Every day | ORAL | 1 refills | Status: DC

## 2024-03-03 MED ORDER — DOXYCYCLINE HYCLATE 100 MG PO TABS: 100.0000 mg | ORAL_TABLET | Freq: Two times a day (BID) | ORAL | 1 refills | Status: DC

## 2024-03-03 MED ORDER — ATROVENT HFA 17 MCG/ACT IN AERS
2.0000 | INHALATION_SPRAY | Freq: Four times a day (QID) | RESPIRATORY_TRACT | 12 refills | Status: AC | PRN
Start: 2024-03-03 — End: ?

## 2024-03-03 NOTE — Patient Instructions (Addendum)
 Lets plan to start Arnuity 1 puff once a day Please rinse mouth after use  Continue to use your Atrovent  inhaler as prescribed  Lets plan to test for sleep apnea with a home sleep study  I will prescribe backup doxycycline  and prednisone    Avoid Allergens and Irritants Avoid secondhand smoke Avoid SICK contacts Recommend  Masking  when appropriate Recommend Keep up-to-date with vaccinations
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
## 2024-03-05 ENCOUNTER — Other Ambulatory Visit (HOSPITAL_COMMUNITY): Payer: Self-pay

## 2024-03-05 ENCOUNTER — Telehealth: Payer: Self-pay

## 2024-03-05 NOTE — Telephone Encounter (Signed)
 Lm x1 for the patient.

## 2024-03-05 NOTE — Telephone Encounter (Signed)
*  Pulm  Pharmacy Patient Advocate Encounter   Received notification from Fax that prior authorization for Atrovent  HFA 17MCG/ACT aerosol   is required/requested.   Insurance verification completed.   The patient is insured through [employer].   Per test claim: PA required; PA started via CoverMyMeds. KEY [id number] . Please see clinical question(s) below that I am not finding the answer to in their chart and advise.   Has the patient had previous treatment, contraindication, or intolerance to Spiriva  (i.e. Spiriva  Handihaler, Spiriva  Respimat)?   No documentation from pharmacy records that patient picked up any alternative meds that were prescribed.

## 2024-03-12 NOTE — Telephone Encounter (Signed)
 I spoke with the patient. He did try Spiriva , given to him by his PCP. It made his heart race and he already has Atrial Fib.   Pharmacy team please let the insurance company know. Thank you!

## 2024-03-15 NOTE — Telephone Encounter (Signed)
 Submitted to plan with this additional information, pending determination.

## 2024-03-15 NOTE — Telephone Encounter (Signed)
 Your request has been approved PA Case: [id number], Status: Approved, Coverage Starts on: 04/23/2023 12:00:00 AM, Coverage Ends on: 04/21/2025 12:00:00 AM. Questions? Contact [phone]. Authorization Expiration12/31/2026

## 2024-03-15 NOTE — Telephone Encounter (Signed)
 I have notified the patient. Nothing further needed.

## 2024-03-23 ENCOUNTER — Ambulatory Visit: Admitting: Internal Medicine

## 2024-03-23 ENCOUNTER — Encounter: Payer: Self-pay | Admitting: Internal Medicine

## 2024-03-23 VITALS — BP 110/64 | HR 55 | Ht 66.0 in | Wt 154.1 lb

## 2024-03-23 DIAGNOSIS — D6869 Other thrombophilia: Secondary | ICD-10-CM

## 2024-03-23 DIAGNOSIS — Z0000 Encounter for general adult medical examination without abnormal findings: Secondary | ICD-10-CM

## 2024-03-23 DIAGNOSIS — A310 Pulmonary mycobacterial infection: Secondary | ICD-10-CM | POA: Diagnosis not present

## 2024-03-23 DIAGNOSIS — I10 Essential (primary) hypertension: Secondary | ICD-10-CM | POA: Diagnosis not present

## 2024-03-23 DIAGNOSIS — I480 Paroxysmal atrial fibrillation: Secondary | ICD-10-CM | POA: Diagnosis not present

## 2024-03-23 MED ORDER — OMEPRAZOLE 40 MG PO CPDR: 40.0000 mg | DELAYED_RELEASE_CAPSULE | Freq: Every day | ORAL | 3 refills | Status: AC

## 2024-03-23 MED ORDER — DICYCLOMINE HCL 10 MG PO CAPS: 10.0000 mg | ORAL_CAPSULE | Freq: Two times a day (BID) | ORAL | 3 refills | Status: AC

## 2024-03-23 NOTE — Progress Notes (Unsigned)
 Patient ID: Tony Cox, male    DOB: [birth year]  Age: 73 y.o. MRN: [id number]  The patient is here for annual preventive examination and management of other chronic and acute problems.   The risk factors are reflected in the social history.   The roster of all physicians providing medical care to patient - is listed in the Snapshot section of the chart.   Activities of daily living:  The patient is 100% independent in all ADLs: dressing, toileting, feeding as well as independent mobility   Home safety : The patient has smoke detectors in the home. They wear seatbelts.  There are no unsecured firearms at home. There is no violence in the home.    There is no risks for hepatitis, STDs or HIV. There is no   history of blood transfusion. They have no travel history to infectious disease endemic areas of the world.   The patient has seen their dentist in the last six month. They have seen their eye doctor in the last year. The patinet  denies slight hearing difficulty with regard to whispered voices and some television programs.  They have deferred audiologic testing in the last year.  They do not  have excessive sun exposure. Discussed the need for sun protection: hats, long sleeves and use of sunscreen if there is significant sun exposure.    Diet: the importance of a healthy diet is discussed. They do have a healthy diet.   The benefits of regular aerobic exercise were discussed. The patient  exercises  3 to 5 days per week  for  60 minutes.    Depression screen: there are no signs or vegative symptoms of depression- irritability, change in appetite, anhedonia, sadness/tearfullness.   The following portions of the patient's history were reviewed and updated as appropriate: allergies, current medications, past family history, past medical history,  past surgical history, past social history  and problem list.   Visual acuity was not assessed per patient preference since the patient has  regular follow up with an  ophthalmologist. Hearing and body mass index were assessed and reviewed.    During the course of the visit the patient was educated and counseled about appropriate screening and preventive services including : fall prevention , diabetes screening, nutrition counseling, colorectal cancer screening, and recommended immunizations.    Chief Complaint:   reviewed September hospitalization for  atrial fib  with RVR , treated at [facility], but had  follow up with [facility] cardiology who  changed carvedilol  to metoprolol and added olmesartan  in split doses . Taking  Eliquis .  Walks 15,000 steps daily,  no falls.    Recurrent bronchitis: now using atrovent  twice daily,  prescribed by Kasa.  MAC diagnosis suggested by  Tamea  refuted by kasa.  Workup planned for    Recurrent bronchitis   Getting COVID vaccine  this weekend.  Had flu shot .  Has  chest CT  scheduled for February . Has chronic cough attributed to either moldy carpet or  problems with school's air system. Kasa involved.   Needs refills  on gabapentin  ,     Review of Symptoms  Patient denies headache, fevers, malaise, unintentional weight loss, skin rash, eye pain, sinus congestion and sinus pain, sore throat, dysphagia,  hemoptysis , cough, dyspnea, wheezing, chest pain, palpitations, orthopnea, edema, abdominal pain, nausea, melena, diarrhea, constipation, flank pain, dysuria, hematuria, urinary  Frequency, nocturia, numbness, tingling, seizures,  Focal weakness, Loss of consciousness,  Tremor, insomnia,  depression, anxiety, and suicidal ideation.    Physical Exam:  BP 110/64   Pulse (!) 55   Ht 5' 6 (1.676 m)   Wt 154 lb 1.9 oz (69.9 kg)   SpO2 98%   BMI 24.88 kg/m    Physical Exam Vitals reviewed.  Constitutional:      General: He is not in acute distress.    Appearance: Normal appearance. He is normal weight. He is not ill-appearing, toxic-appearing or diaphoretic.  HENT:     Head: Normocephalic.   Eyes:     General: No scleral icterus.       Right eye: No discharge.        Left eye: No discharge.     Conjunctiva/sclera: Conjunctivae normal.  Cardiovascular:     Rate and Rhythm: Normal rate and regular rhythm.     Heart sounds: Normal heart sounds.  Pulmonary:     Effort: Pulmonary effort is normal. No respiratory distress.     Breath sounds: Normal breath sounds.  Musculoskeletal:        General: Normal range of motion.     Cervical back: Normal range of motion.  Skin:    General: Skin is warm and dry.  Neurological:     General: No focal deficit present.     Mental Status: He is alert and oriented to person, place, and time. Mental status is at baseline.  Psychiatric:        Mood and Affect: Mood normal.        Behavior: Behavior normal.        Thought Content: Thought content normal.        Judgment: Judgment normal.     Assessment and Plan: Mycobacterium avium complex (HCC) Assessment & Plan: Diagnosis in question by 2nd pulmnolonogy opinion (Kasa). CT scan scheduled    Primary hypertension Assessment & Plan: Managed ow with olmesartan and metoprolol per [facility]   Visit for preventive health examination Assessment & Plan: age appropriate education and counseling updated, referrals for preventative services and immunizations addressed, dietary and smoking counseling addressed, most recent labs reviewed.  I have personally reviewed and have noted:   1) the patient's medical and social history 2) The pt's use of alcohol, tobacco, and illicit drugs 3) The patient's current medications and supplements 4) Functional ability including ADL's, fall risk, home safety risk, hearing and visual impairment 5) Diet and physical activities 6) Evidence for depression or mood disorder 7) The patient's height, weight, and BMI have been recorded in the chart    I have made referrals, and provided counseling and education based on review of the above    Acquired  thrombophilia Assessment & Plan: secondary to atrial fibrillation.  he is tolerating use of Eliquis for embolic stroke risk mitigation due to  atrial fibrillation. Patient has no signs of bleeding and is advised to notify his specialists prior to any procedure that may required suspension of Eliquis     Paroxysmal atrial fibrillation (HCC) Assessment & Plan: Managed with metoprolol and eliquis  [facility]    Other orders -     Omeprazole ; Take 1 capsule (40 mg total) by mouth daily.  Dispense: 90 capsule; Refill: 3 -     Dicyclomine  HCl; Take 1 capsule (10 mg total) by mouth 2 (two) times daily.  Dispense: 180 capsule; Refill: 3    No follow-ups on file.  Verneita LITTIE Kettering, MD

## 2024-03-24 DIAGNOSIS — D6869 Other thrombophilia: Secondary | ICD-10-CM | POA: Insufficient documentation

## 2024-03-24 DIAGNOSIS — I480 Paroxysmal atrial fibrillation: Secondary | ICD-10-CM | POA: Insufficient documentation

## 2024-03-24 NOTE — Assessment & Plan Note (Signed)

## 2024-03-24 NOTE — Assessment & Plan Note (Signed)
 Managed with metoprolol and eliquis  [facility]

## 2024-03-24 NOTE — Assessment & Plan Note (Signed)
 Managed ow with olmesartan and metoprolol per [facility] cardiology

## 2024-03-24 NOTE — Assessment & Plan Note (Signed)
 Diagnosis in question by 2nd pulmnolonogy opinion (Kasa). CT scan scheduled

## 2024-03-24 NOTE — Assessment & Plan Note (Signed)
 secondary to atrial fibrillation.  he is tolerating use of Eliquis for embolic stroke risk mitigation due to  atrial fibrillation. Patient has no signs of bleeding and is advised to notify his specialists prior to any procedure that may required suspension of Eliquis

## 2024-04-12 ENCOUNTER — Other Ambulatory Visit: Payer: Self-pay | Admitting: Dermatology

## 2024-04-13 ENCOUNTER — Ambulatory Visit: Admitting: Dermatology

## 2024-04-23 ENCOUNTER — Encounter: Payer: Self-pay | Admitting: Internal Medicine

## 2024-04-23 ENCOUNTER — Other Ambulatory Visit: Payer: Self-pay | Admitting: Internal Medicine

## 2024-04-23 ENCOUNTER — Telehealth: Payer: Self-pay

## 2024-04-23 DIAGNOSIS — J101 Influenza due to other identified influenza virus with other respiratory manifestations: Secondary | ICD-10-CM | POA: Insufficient documentation

## 2024-04-23 MED ORDER — OSELTAMIVIR PHOSPHATE 75 MG PO CAPS: 75.0000 mg | ORAL_CAPSULE | Freq: Two times a day (BID) | ORAL | 0 refills | Status: AC

## 2024-04-23 NOTE — Telephone Encounter (Signed)
 Copied from CRM [id number]. Topic: Clinical - Medical Advice >> Apr 23, 2024  1:35 PM Brittany M wrote: Reason for CRM: tested positive for type A flu- tested at home today. Wanting to get a prescription for [employer] and spoke with patient.  He reported that he had also texted Dr. Tullo and that Tamiflu  has already been sent in.  No further needs at this time.

## 2024-04-28 ENCOUNTER — Telehealth: Payer: Self-pay

## 2024-04-28 DIAGNOSIS — Z9889 Other specified postprocedural states: Secondary | ICD-10-CM

## 2024-04-28 MED ORDER — AMOXICILLIN 500 MG PO CAPS: 2000.0000 mg | ORAL_CAPSULE | Freq: Once | ORAL | 3 refills | Status: AC

## 2024-04-28 NOTE — Addendum Note (Signed)
 Addended by: MARYLYNN VERNEITA CROME on: 04/28/2024 04:09 PM   Modules accepted: Orders

## 2024-04-28 NOTE — Assessment & Plan Note (Signed)
 S/p mitral valve repair with prosthetic ring.  No significant MS or MR by Feb 2022 ECHO.  And EF >55% (Fath).  Needs to take amox prior to dental cleanings/procedures

## 2024-04-28 NOTE — Telephone Encounter (Signed)
 Copied from CRM #[id number]. Topic: Clinical - Medication Question >> Apr 28, 2024 11:29 AM Burnard DEL wrote: Reason for CRM: Patient called in stating that he has a dentist appointment on 05/01/2023 and he have to take a antibiotic before he goes. He would like to know if amoxicillin   could be sent to   [facility] - [place], [place] - [address]  Phone: [phone] Fax: [phone]

## 2024-04-29 NOTE — Telephone Encounter (Signed)
 Called pt and left voicemail to give the office a call back for clarification.

## 2024-05-05 ENCOUNTER — Ambulatory Visit: Admitting: Dermatology

## 2024-05-05 ENCOUNTER — Encounter: Payer: Self-pay | Admitting: Dermatology

## 2024-05-05 DIAGNOSIS — W908XXA Exposure to other nonionizing radiation, initial encounter: Secondary | ICD-10-CM

## 2024-05-05 DIAGNOSIS — L718 Other rosacea: Secondary | ICD-10-CM

## 2024-05-05 DIAGNOSIS — L738 Other specified follicular disorders: Secondary | ICD-10-CM

## 2024-05-05 DIAGNOSIS — L820 Inflamed seborrheic keratosis: Secondary | ICD-10-CM

## 2024-05-05 DIAGNOSIS — L739 Follicular disorder, unspecified: Secondary | ICD-10-CM

## 2024-05-05 DIAGNOSIS — L821 Other seborrheic keratosis: Secondary | ICD-10-CM

## 2024-05-05 DIAGNOSIS — L570 Actinic keratosis: Secondary | ICD-10-CM

## 2024-05-05 DIAGNOSIS — Z79899 Other long term (current) drug therapy: Secondary | ICD-10-CM

## 2024-05-05 DIAGNOSIS — L719 Rosacea, unspecified: Secondary | ICD-10-CM

## 2024-05-05 DIAGNOSIS — Z7189 Other specified counseling: Secondary | ICD-10-CM

## 2024-05-05 DIAGNOSIS — L304 Erythema intertrigo: Secondary | ICD-10-CM

## 2024-05-05 DIAGNOSIS — L578 Other skin changes due to chronic exposure to nonionizing radiation: Secondary | ICD-10-CM

## 2024-05-05 MED ORDER — CLINDAMYCIN PHOSPHATE 1 % EX SOLN: CUTANEOUS | 11 refills | Status: AC

## 2024-05-05 MED ORDER — KETOCONAZOLE 2 % EX SHAM: MEDICATED_SHAMPOO | CUTANEOUS | 12 refills | Status: AC

## 2024-05-05 NOTE — Patient Instructions (Addendum)
 Seborrheic Keratosis  What causes seborrheic keratoses? Seborrheic keratoses are harmless, common skin growths that first appear during adult life.  As time goes by, more growths appear.  Some people may develop a large number of them.  Seborrheic keratoses appear on both covered and uncovered body parts.  They are not caused by sunlight.  The tendency to develop seborrheic keratoses can be inherited.  They vary in color from skin-colored to gray, brown, or even black.  They can be either smooth or have a rough, warty surface.   Seborrheic keratoses are superficial and look as if they were stuck on the skin.  Under the microscope this type of keratosis looks like layers upon layers of skin.  That is why at times the top layer may seem to fall off, but the rest of the growth remains and re-grows.    Treatment Seborrheic keratoses do not need to be treated, but can easily be removed in the office.  Seborrheic keratoses often cause symptoms when they rub on clothing or jewelry.  Lesions can be in the way of shaving.  If they become inflamed, they can cause itching, soreness, or burning.  Removal of a seborrheic keratosis can be accomplished by freezing, burning, or surgery. If any spot bleeds, scabs, or grows rapidly, please return to have it checked, as these can be an indication of a skin cancer.   Cryotherapy Aftercare  Wash gently with soap and water everyday.   Apply Vaseline and Band-Aid daily until healed.     Due to recent changes in healthcare laws, you may see results of your pathology and/or laboratory studies on MyChart before the doctors have had a chance to review them. We understand that in some cases there may be results that are confusing or concerning to you. Please understand that not all results are received at the same time and often the doctors may need to interpret multiple results in order to provide you with the best plan of care or course of treatment. Therefore, we ask  that you please give us  2 business days to thoroughly review all your results before contacting the office for clarification. Should we see a critical lab result, you will be contacted sooner.   If You Need Anything After Your Visit  If you have any questions or concerns for your doctor, please call our main line at [phone] and press option 4 to reach your doctor's medical assistant. If no one answers, please leave a voicemail as directed and we will return your call as soon as possible. Messages left after 4 pm will be answered the following business day.   You may also send us  a message via MyChart. We typically respond to MyChart messages within 1-2 business days.  For prescription refills, please ask your pharmacy to contact our office. Our fax number is [phone].  If you have an urgent issue when the clinic is closed that cannot wait until the next business day, you can page your doctor at the number below.    Please note that while we do our best to be available for urgent issues outside of office hours, we are not available 24/7.   If you have an urgent issue and are unable to reach us , you may choose to seek medical care at your doctor's office, retail clinic, urgent care center, or emergency room.  If you have a medical emergency, please immediately call 911 or go to the emergency department.  Pager Numbers  - Dr. Hester:  [phone]  - Dr. Jackquline: [phone]  - Dr. Claudene: [phone]   - Dr. Raymund: [phone]  In the event of inclement weather, please call our main line at [phone] for an update on the status of any delays or closures.  Dermatology Medication Tips: Please keep the boxes that topical medications come in in order to help keep track of the instructions about where and how to use these. Pharmacies typically print the medication instructions only on the boxes and not directly on the medication tubes.   If your medication is too expensive,  please contact our office at [phone] option 4 or send us  a message through MyChart.   We are unable to tell what your co-pay for medications will be in advance as this is different depending on your insurance coverage. However, we may be able to find a substitute medication at lower cost or fill out paperwork to get insurance to cover a needed medication.   If a prior authorization is required to get your medication covered by your insurance company, please allow us  1-2 business days to complete this process.  Drug prices often vary depending on where the prescription is filled and some pharmacies may offer cheaper prices.  The website www.goodrx.com contains coupons for medications through different pharmacies. The prices here do not account for what the cost may be with help from insurance (it may be cheaper with your insurance), but the website can give you the price if you did not use any insurance.  - You can print the associated coupon and take it with your prescription to the pharmacy.  - You may also stop by our office during regular business hours and pick up a GoodRx coupon card.  - If you need your prescription sent electronically to a different pharmacy, notify our office through [facility] or by phone at [phone] option 4.     Si Usted Necesita Algo Despus de Su Visita  Tambin puede enviarnos un mensaje a travs de Clinical cytogeneticist. Por lo general respondemos a los mensajes de MyChart en el transcurso de 1 a 2 das hbiles.  Para renovar recetas, por favor pida a su farmacia que se ponga en contacto con nuestra oficina. Randi lakes de fax es [place] [phone].  Si tiene un asunto urgente cuando la clnica est cerrada y que no puede esperar hasta el siguiente da hbil, puede llamar/localizar a su doctor(a) al nmero que aparece a continuacin.   Por favor, tenga en cuenta que aunque hacemos todo lo posible para estar disponibles para asuntos urgentes fuera del  horario de [place], no estamos disponibles las 24 horas del da, los 7 [address]  [address] de la [place].   Si tiene un problema urgente y no puede comunicarse con nosotros, puede optar por buscar atencin mdica  en el consultorio de su doctor(a), en una clnica privada, en un centro de atencin urgente o en una sala de emergencias.  Si tiene Engineer, drilling, por favor llame inmediatamente al 911 o vaya a la sala de emergencias.  Nmeros de bper  - Dr. Hester: [phone]  - Dra. Jackquline: [phone]  - Dr. Claudene: [phone]  - Dra. Kitts: [phone]  En caso de inclemencias del [place], por favor llame a nuestra lnea principal al [phone] para una actualizacin sobre el estado de cualquier retraso o cierre.  Consejos para la medicacin en dermatologa: Por favor, guarde las cajas en las que vienen los medicamentos de uso tpico para ayudarle a seguir las instrucciones sobre dnde y cmo  usarlos. Las farmacias generalmente imprimen las instrucciones del medicamento slo en las cajas y no directamente en los tubos del [place].   Si su medicamento es muy caro, por favor, pngase en contacto con landry rieger llamando al [phone] y presione la opcin 4 o envenos un mensaje a travs de Clinical cytogeneticist.   No podemos decirle cul ser su copago por los medicamentos por adelantado ya que esto es diferente dependiendo de la cobertura de su seguro. Sin embargo, es posible que podamos encontrar un medicamento sustituto a Audiological scientist un formulario para que el seguro cubra el medicamento que se considera necesario.   Si se requiere una autorizacin previa para que su compaa de seguros malta su medicamento, por favor permtanos de 1 a 2 das hbiles para completar este proceso.  Los precios de los medicamentos varan con frecuencia dependiendo del Environmental consultant de dnde se surte la receta y alguna farmacias pueden ofrecer precios ms baratos.  El sitio web www.goodrx.com tiene cupones para  medicamentos de Health and safety inspector. Los precios aqu no tienen en cuenta lo que podra costar con la ayuda del seguro (puede ser ms barato con su seguro), pero el sitio web puede darle el precio si no utiliz Tourist information centre manager.  - Puede imprimir el cupn correspondiente y llevarlo con su receta a la farmacia.  - Tambin puede pasar por nuestra oficina durante el horario de atencin regular y Education officer, museum una tarjeta de cupones de GoodRx.  - Si necesita que su receta se enve electrnicamente a una farmacia diferente, informe a nuestra oficina a travs de MyChart de [place] o por telfono llamando al [phone] y presione la opcin 4.

## 2024-05-05 NOTE — Progress Notes (Signed)
 "  Follow-Up Visit   Subjective  Tony Cox is a 74 y.o. male who presents for the following: Patient here for rash at groin followup. Reports rash has resolved and does not need to be checked today. Patient would like to discuss some spots at scalp.  The patient has spots, moles and lesions to be evaluated, some may be new or changing and the patient may have concern these could be cancer.  The following portions of the chart were reviewed this encounter and updated as appropriate: medications, allergies, medical history  Review of Systems:  No other skin or systemic complaints except as noted in HPI or Assessment and Plan.  Objective  Well appearing patient in no apparent distress; mood and affect are within normal limits.  A focused examination was performed of the following areas: scalp  Relevant exam findings are noted in the Assessment and Plan.  Scalp x 16 (16) Erythematous stuck-on, waxy papule or plaque scalp x 2 (2) Erythematous thin papules/macules with gritty scale.   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 It can be infectious (bacterial, fungal) or noninfectious (shaving, tight clothing, heat/sweat, medications).  Folliculitis can be acute or chronic and recommended treatment depends on the underlying cause of folliculitis. Chronic and persistent condition with duration or expected duration over one year. Condition is symptomatic / bothersome to patient. Not to goal. Treatment Plan: Start  Ketoconazole  shampoo 2 % - apply three times per week, massage into scalp and leave in for a few minutes before rinsing out Start Clindamycin  1% solution - apply topically to any acne pimples at scalp daily to twice daily until  clear     SEBORRHEIC KERATOSIS - Stuck-on, waxy, tan-brown papules and/or plaques  - Benign-appearing - Discussed benign etiology and prognosis. - Observe - Call for any changes   ACTINIC DAMAGE - chronic, secondary to cumulative UV radiation exposure/sun exposure over time - diffuse scaly erythematous macules with underlying dyspigmentation - Recommend daily broad spectrum sunscreen SPF 30+ to sun-exposed areas, reapply every 2 hours as needed.  - Recommend staying in the shade or wearing long sleeves, sun glasses (UVA+UVB protection) and wide brim hats (4-inch brim around the entire circumference of the hat). - Call for new or changing lesions.  History of groin rash Intertrigo Resolved today   INFLAMED SEBORRHEIC KERATOSIS (16) Scalp x 16 (16) Symptomatic, irritating, patient would like treated. 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 They are chronic with  expected duration over 1 year. A portion of actinic keratoses will progress to squamous cell carcinoma of the skin. It is not possible to reliably predict which spots will progress to skin cancer and so treatment is recommended to prevent development of skin cancer.  Recommend daily broad spectrum sunscreen SPF 30+ to sun-exposed areas, reapply every 2 hours as needed.  Recommend staying in the shade or wearing long sleeves, sun glasses (UVA+UVB protection) and wide brim hats (4-inch brim around the entire circumference of the hat). Call for new or changing lesions. - Destruction of lesion - scalp x 2 (2) Complexity: simple   Destruction method: cryotherapy   Informed consent: discussed and consent obtained   Timeout:  patient name, date of birth, surgical site, and procedure verified Lesion destroyed using liquid nitrogen: Yes   Region frozen until ice ball extended beyond lesion: Yes   Outcome: patient tolerated procedure well with no complications   Post-procedure details: wound care instructions given     Follow up 1 year or sooner if problem  I, Eleanor Blush, CMA, am acting as scribe for Alm Rhyme, MD.   Documentation: I have reviewed the above documentation for accuracy and completeness, and I agree with the above.  Alm Rhyme, MD    "

## 2024-06-17 ENCOUNTER — Ambulatory Visit: Admitting: Internal Medicine
